# Patient Record
Sex: Male | Born: 1960 | Hispanic: Yes | Marital: Married | Smoking: Never smoker
Health system: Southern US, Community
[De-identification: ages and names within clinical notes are randomized; demographics above are authoritative.]

## PROBLEM LIST (undated history)

## (undated) DIAGNOSIS — N400 Enlarged prostate without lower urinary tract symptoms: Secondary | ICD-10-CM

## (undated) DIAGNOSIS — I10 Essential (primary) hypertension: Secondary | ICD-10-CM

## (undated) DIAGNOSIS — M545 Low back pain, unspecified: Secondary | ICD-10-CM

## (undated) DIAGNOSIS — T7840XA Allergy, unspecified, initial encounter: Secondary | ICD-10-CM

## (undated) DIAGNOSIS — M79605 Pain in left leg: Secondary | ICD-10-CM

## (undated) DIAGNOSIS — I639 Cerebral infarction, unspecified: Secondary | ICD-10-CM

## (undated) DIAGNOSIS — E785 Hyperlipidemia, unspecified: Secondary | ICD-10-CM

## (undated) HISTORY — PX: OTHER SURGICAL HISTORY: SHX169

## (undated) HISTORY — DX: Essential (primary) hypertension: I10

## (undated) HISTORY — DX: Allergy, unspecified, initial encounter: T78.40XA

## (undated) HISTORY — PX: COLONOSCOPY: SHX174

## (undated) HISTORY — PX: ESOPHAGOGASTRODUODENOSCOPY: SHX1529

## (undated) HISTORY — DX: Hyperlipidemia, unspecified: E78.5

## (undated) HISTORY — DX: Cerebral infarction, unspecified: I63.9

---

## 2013-01-22 ENCOUNTER — Ambulatory Visit (INDEPENDENT_AMBULATORY_CARE_PROVIDER_SITE_OTHER): Payer: 59 | Admitting: Family Medicine

## 2013-01-22 VITALS — BP 120/98 | HR 77 | Temp 98.2°F | Resp 18 | Ht 66.0 in | Wt 161.0 lb

## 2013-01-22 DIAGNOSIS — J029 Acute pharyngitis, unspecified: Secondary | ICD-10-CM

## 2013-01-22 LAB — POCT RAPID STREP A (OFFICE): Rapid Strep A Screen: NEGATIVE

## 2013-01-22 NOTE — Patient Instructions (Addendum)
Faringitis Viral (Viral Pharyngitis)  La faringitis virales una infeccin viral que produce enrojecimiento, dolor e hinchazn (inflamacin) en la garganta. No se disemina de [place] persona a otra (no es contagiosa). CAUSAS La causa es la inhalacin de una gran cantidad de grmenes llamados virus. Muchos virus diferentes pueden causar faringitis viral. SNTOMAS Los sntomas de faringitis viral son:  Dolor de Corporate treasurer.  Nariz tapada.  Fiebre no muy elevada  Congestin  Tos TRATAMIENTO El tratamiento incluye reposo, beber muchos lquidos y el uso de medicamentos de venta libre (autorizados por el mdico) INSTRUCCIONES PARA EL CUIDADO EN EL HOGAR   Debe ingerir gran cantidad de lquido para mantener la orina de tono claro o color amarillo plido.  Consuma alimentos blandos, fros, como helados de crema, de agua o gelatina.  Puede hacer grgaras con agua tibia con sal (una cucharadita en 1 litro de agua).  Despus de los 7 aos, pueden administrarse pastillas para la tos con seguridad.  Solo tome medicamentos que se pueden comprar sin receta o recetados para Conservation officer, historic buildings, Tree surgeon o fiebre, como le indica el mdico. No tome aspirina Para no contagiar evite:  El contacto boca a boca con Producer, television/film/video.  Compartir utensilios para comer o beber.  Toser cerca de otras personas SOLICITE ATENCIN MDICA SI:   Mejora luego de [employer] luego Victor.  Tiene fiebre o siente un dolor intenso que no puede ser controlado con los medicamentos.  Hay otros cambios que lo preocupan. Document Released: 10/12/2004 Document Revised: 03/27/2011 [facility] Patient Information 2014 [place], [place].    Take over-the-counter Claritin-D(loratadine D ) one daily to reduce the drainage  Drink plenty of fluids and get sufficient rest  If you develop a cough begin taking some Mucinex DM  Take Tylenol ibuprofen for the headache  Expect this to last for 5-7 days. If you're getting  worse get back to me

## 2013-01-22 NOTE — Progress Notes (Signed)
Subjective: 53 year old man here for the first time. He has been seeing a Dr. in [place] for his blood pressure and cholesterol. He is hoping to take a job. [employer] in the very near future. He used to live in [place] also. He was raised in [place]. He has been having a sore throat since yesterday. No nausea or vomiting. No ear problems. His head does hurt with a "sinus" headache. He has had some clear rhinorrhea. His chest has been okay with no cough. He usually gets these infections a couple of times a year.  Objective: Pleasant alert gentleman in no major distress. TMs normal. Throat erythematous mildly warm the right than the left. No nodes. He is a little cyst in his right tonsillar pillar. His neck was supple without significant nodes. Chest is clear to auscultation. Heart regular without murmurs.  Assessment: Pharyngitis URI  Plan: Strep test Throat culture if negative  Results for orders placed in visit on 01/22/13  POCT RAPID STREP A (OFFICE)      Result Value Range   Rapid Strep A Screen Negative  Negative

## 2013-01-24 LAB — CULTURE, GROUP A STREP

## 2014-02-27 ENCOUNTER — Ambulatory Visit (INDEPENDENT_AMBULATORY_CARE_PROVIDER_SITE_OTHER): Payer: 59 | Admitting: Physician Assistant

## 2014-02-27 VITALS — BP 122/80 | HR 79 | Temp 98.1°F | Resp 18 | Ht 67.25 in | Wt 163.4 lb

## 2014-02-27 DIAGNOSIS — J019 Acute sinusitis, unspecified: Secondary | ICD-10-CM

## 2014-02-27 MED ORDER — FLUTICASONE PROPIONATE 50 MCG/ACT NA SUSP: 2.0000 | Freq: Every day | NASAL | Status: DC

## 2014-02-27 NOTE — Progress Notes (Signed)
   Subjective:    Patient ID: Robert Sandoval, male    DOB: January 26, 1960, 54 y.o.   MRN: [id number]  HPI Patient presents with 2 weeks of sinus pressure and congestion. Sx not getting any worse, but are not going away. Sx accompanied by decreased hearing in right ear, and headache, but denies rhinorrhea, post nasal drip, sore throat, cough, fever, or chills. Has allergies and is taking loratadine. Also has tried nasal saline spray without much relief. NKDA.    Review of Systems  Constitutional: Negative for fever, chills and fatigue.  HENT: Positive for congestion and sinus pressure. Negative for ear discharge, ear pain, postnasal drip, rhinorrhea, sneezing and sore throat.   Eyes: Negative.   Respiratory: Negative for cough and shortness of breath.   Cardiovascular: Negative for chest pain.  Gastrointestinal: Negative for nausea and vomiting.  Allergic/Immunologic: Positive for environmental allergies.  Neurological: Positive for headaches. Negative for dizziness and light-headedness.       Objective:   Physical Exam  Constitutional: He is oriented to person, place, and time. He appears well-developed and well-nourished. No distress.  Blood pressure 122/80, pulse 79, temperature 98.1 F (36.7 C), temperature source Oral, resp. rate 18, height 5' 7.25" (1.708 m), weight 163 lb 6.4 oz (74.118 kg), SpO2 98 %.  HENT:  Head: Normocephalic and atraumatic.  Right Ear: Hearing, tympanic membrane, external ear and ear canal normal.  Left Ear: Hearing, tympanic membrane, external ear and ear canal normal.  Nose: Mucosal edema and rhinorrhea (with moderate erythema) present. Right sinus exhibits no maxillary sinus tenderness and no frontal sinus tenderness. Left sinus exhibits no maxillary sinus tenderness and no frontal sinus tenderness.  Mouth/Throat: Uvula is midline, oropharynx is clear and moist and mucous membranes are normal. No oropharyngeal exudate, posterior oropharyngeal edema or posterior  oropharyngeal erythema.  Eyes: Conjunctivae are normal. Pupils are equal, round, and reactive to light. Right eye exhibits no discharge. Left eye exhibits no discharge. No scleral icterus.  Neck: Normal range of motion. Neck supple. No thyromegaly present.  Cardiovascular: Normal rate, regular rhythm and normal heart sounds.  Exam reveals no gallop and no friction rub.   No murmur heard. Pulmonary/Chest: Effort normal and breath sounds normal. No respiratory distress. He has no decreased breath sounds. He has no wheezes. He has no rhonchi. He has no rales. He exhibits no tenderness.  Abdominal: There is no tenderness.  Lymphadenopathy:    He has no cervical adenopathy.  Neurological: He is alert and oriented to person, place, and time.  Skin: Skin is warm and dry. No rash noted. He is not diaphoretic. No erythema. No pallor.       Assessment & Plan:  1. Acute sinusitis, recurrence not specified, unspecified location Continue to take loratadine. Take mucinex with plenty of water.  - fluticasone (FLONASE) 50 MCG/ACT nasal spray; Place 2 sprays into both nostrils daily.  Dispense: 16 g; Refill: Success PA-C  [facility] 02/27/2014 1:09 PM

## 2014-02-27 NOTE — Patient Instructions (Signed)

## 2014-03-17 ENCOUNTER — Ambulatory Visit (INDEPENDENT_AMBULATORY_CARE_PROVIDER_SITE_OTHER): Payer: 59 | Admitting: Physician Assistant

## 2014-03-17 VITALS — BP 144/88 | HR 63 | Temp 98.0°F | Resp 17 | Ht 67.0 in | Wt 165.0 lb

## 2014-03-17 DIAGNOSIS — Z1329 Encounter for screening for other suspected endocrine disorder: Secondary | ICD-10-CM

## 2014-03-17 DIAGNOSIS — Z23 Encounter for immunization: Secondary | ICD-10-CM | POA: Diagnosis not present

## 2014-03-17 DIAGNOSIS — Z125 Encounter for screening for malignant neoplasm of prostate: Secondary | ICD-10-CM | POA: Diagnosis not present

## 2014-03-17 DIAGNOSIS — Z131 Encounter for screening for diabetes mellitus: Secondary | ICD-10-CM

## 2014-03-17 DIAGNOSIS — Z0000 Encounter for general adult medical examination without abnormal findings: Secondary | ICD-10-CM

## 2014-03-17 DIAGNOSIS — Z13228 Encounter for screening for other metabolic disorders: Secondary | ICD-10-CM | POA: Diagnosis not present

## 2014-03-17 DIAGNOSIS — Z13220 Encounter for screening for lipoid disorders: Secondary | ICD-10-CM

## 2014-03-17 LAB — POCT URINALYSIS DIPSTICK
BILIRUBIN UA: NEGATIVE
Glucose, UA: NEGATIVE
Ketones, UA: NEGATIVE
Leukocytes, UA: NEGATIVE
NITRITE UA: NEGATIVE
PH UA: 6.5
Protein, UA: NEGATIVE
Spec Grav, UA: 1.01
Urobilinogen, UA: 0.2

## 2014-03-17 LAB — POCT UA - MICROSCOPIC ONLY
BACTERIA, U MICROSCOPIC: NEGATIVE
Casts, Ur, LPF, POC: NEGATIVE
Crystals, Ur, HPF, POC: NEGATIVE
EPITHELIAL CELLS, URINE PER MICROSCOPY: NEGATIVE
MUCUS UA: NEGATIVE
RBC, urine, microscopic: NEGATIVE
WBC, Ur, HPF, POC: NEGATIVE
Yeast, UA: NEGATIVE

## 2014-03-17 NOTE — Progress Notes (Signed)
[facility] [address], [place] [address] [phone]  Date:  03/17/2014   Name:  Robert Sandoval   DOB:  10/11/1960   MRN:  [id number]  PCP:  No primary care provider on file.    Chief Complaint: Annual Exam   History of Present Illness:  Robert Sandoval is a 54 y.o. very pleasant male patient who presents with the following:  He has no complaints or concerns at this time.    Diet: Grilled white meat and seafoods mostly.  Patient has been attempting to eat better since he had started the blood pressure medication.  He eats very little fried food.     He was taking cholesterol.  Moved from [place], 1 year ago.  Bowels: Some constipation, which results with some blood.  He uses one pill every 2 days which he describes as a "colon cleanser" which helps.  Patient has had 2 colonoscopies by the age of 80.  The first colonoscopy was to address the constipation.  Last colonscopy 3 years ago found polyps of unknown number.   Urination: No dysuria, polyuria, or hematuria.    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    No familial hx of diabetes or cancer.  There are no active problems to display for this patient.   Past Medical History  Diagnosis Date  . Hyperlipidemia   . Hypertension   . Allergy     History reviewed. No pertinent past surgical history.  History  Substance Use Topics  . Smoking status: Never Smoker   . Smokeless tobacco: Not on file  . Alcohol Use: Yes    Family History  Problem Relation Age of Onset  . Hyperlipidemia Father   . Asthma Father     No Known Allergies  Medication list has been reviewed and updated.  Current Outpatient Prescriptions on File Prior to Visit  Medication Sig Dispense Refill  . fluticasone (FLONASE) 50 MCG/ACT nasal spray Place 2 sprays into both nostrils daily. 16 g 12  . loratadine (CLARITIN) 10 MG tablet Take 10 mg by mouth daily.    Marland Kitchen lisinopril (PRINIVIL,ZESTRIL) 10  MG tablet Take 10 mg by mouth daily.    . simvastatin (ZOCOR) 40 MG tablet Take 40 mg by mouth daily.     No current facility-administered medications on file prior to visit.    Review of Systems: ROS otherwise unremarkable unless listed above.  Physical Examination: Filed Vitals:   03/17/14 1206  BP: 144/88  Pulse: 63  Temp: 98 F (36.7 C)  Resp: 17   Filed Vitals:   03/17/14 1206  Height: 5\' 7"  (1.702 m)  Weight: 165 lb (74.844 kg)   Body mass index is 25.84 kg/(m^2). Ideal Body Weight: Weight in (lb) to have BMI = 25: 159.3  Physical Exam  Constitutional: He is oriented to person, place, and time. He appears well-developed and well-nourished.  HENT:  Head: Normocephalic and atraumatic.  Eyes: EOM are normal. Pupils are equal, round, and reactive to light.  Neck: Normal range of motion. Neck supple. No thyromegaly present.  Cardiovascular: Normal rate, regular rhythm, normal heart sounds and intact distal pulses.  Exam reveals no friction rub.   No murmur heard. Pulmonary/Chest: Effort normal and breath sounds normal. No respiratory distress. He has no wheezes. He exhibits no tenderness.  Abdominal: Soft. Bowel sounds are normal. Hernia confirmed negative in the right inguinal area and confirmed negative in the left  inguinal area.  Genitourinary: Prostate normal, testes normal and penis normal. Rectal exam shows external hemorrhoid (Very small hernia at the right posterior of anus). Rectal exam shows no internal hemorrhoid, no fissure and anal tone normal. Cremasteric reflex is present. No penile tenderness.  Musculoskeletal: Normal range of motion. He exhibits no edema or tenderness.  Lymphadenopathy:    He has no cervical adenopathy.  Neurological: He is alert and oriented to person, place, and time. He has normal reflexes.  Skin: Skin is warm and dry.  Psychiatric: He has a normal mood and affect. His speech is normal and behavior is normal. Judgment and thought content  normal. Cognition and memory are normal.      Assessment and Plan: 54 year old male with PMH of HL and HTN is here today for physical exam.  Patient's blood pressure was in a fairly good range at previous visits.  Patient has attempted to make lifestyle changes to avoid blood pressure medication. -Advised patient to check bp 2x/week, and if elevated, contact to restart anti-hypertensives. -Will refill statin once lipid results are available -Contacted [facility] for previous results from colonoscopy and recommeded timeline to repeat colonoscopy.  Annual physical exam - Plan: TSH, COMPLETE METABOLIC PANEL WITH GFR, PSA, POCT UA - Microscopic Only, POCT urinalysis dipstick, Lipid panel, Flu Vaccine QUAD 36+ mos IM  Need for prophylactic vaccination and inoculation against influenza - Plan: Flu Vaccine QUAD 36+ mos IM  Screening for thyroid disorder - Plan: TSH  Screening for prostate cancer - Plan: PSA  Screening for diabetes mellitus - Plan: POCT UA - Microscopic Only, POCT urinalysis dipstick  Screening for hyperlipidemia - Plan: Lipid panel  Screening for metabolic disorder - Plan: COMPLETE METABOLIC PANEL WITH GFR  Ivar Drape, PA-C [facility] 3/1/20168:00 PM

## 2014-03-17 NOTE — Patient Instructions (Signed)
Your Blood pressure has been fine when you have been present so check your blood pressure at home or at the pharmacy.  If you have the blood pressure over 140/90, then you need to return here for blood pressure medication.  I will have your labs within the next 10 days.      Keeping you healthy  Get these tests  Blood pressure- Have your blood pressure checked once a year by your healthcare provider.  Normal blood pressure is 120/80  Weight- Have your body mass index (BMI) calculated to screen for obesity.  BMI is a measure of body fat based on height and weight. You can also calculate your own BMI at ViewBanking.si.  Cholesterol- Have your cholesterol checked every year.  Diabetes- Have your blood sugar checked regularly if you have high blood pressure, high cholesterol, have a family history of diabetes or if you are overweight.  Screening for Colon Cancer- Colonoscopy starting at age 69.  Screening may begin sooner depending on your family history and other health conditions. Follow up colonoscopy as directed by your Gastroenterologist.  Screening for Prostate Cancer- Both blood work (PSA) and a rectal exam help screen for Prostate Cancer.  Screening begins at age 73 with African-American men and at age 26 with Caucasian men.  Screening may begin sooner depending on your family history.  Take these medicines  Aspirin- One aspirin daily can help prevent Heart disease and Stroke.  Flu shot- Every fall.  Tetanus- Every 10 years.  Zostavax- Once after the age of 41 to prevent Shingles.  Pneumonia shot- Once after the age of 31; if you are younger than 1, ask your healthcare provider if you need a Pneumonia shot.  Take these steps  Don't smoke- If you do smoke, talk to your doctor about quitting.  For tips on how to quit, go to www.smokefree.gov or call 1-800-QUIT-NOW.  Be physically active- Exercise 5 days a week for at least 30 minutes.  If you are not already  physically active start slow and gradually work up to 30 minutes of moderate physical activity.  Examples of moderate activity include walking briskly, mowing the yard, dancing, swimming, bicycling, etc.  Eat a healthy diet- Eat a variety of healthy food such as fruits, vegetables, low fat milk, low fat cheese, yogurt, lean meant, poultry, fish, beans, tofu, etc. For more information go to www.thenutritionsource.org  Drink alcohol in moderation- Limit alcohol intake to less than two drinks a day. Never drink and drive.  Dentist- Brush and floss twice daily; visit your dentist twice a year.  Depression- Your emotional health is as important as your physical health. If you're feeling down, or losing interest in things you would normally enjoy please talk to your healthcare provider.  Eye exam- Visit your eye doctor every year.  Safe sex- If you may be exposed to a sexually transmitted infection, use a condom.  Seat belts- Seat belts can save your life; always wear one.  Smoke/Carbon Monoxide detectors- These detectors need to be installed on the appropriate level of your home.  Replace batteries at least once a year.  Skin cancer- When out in the sun, cover up and use sunscreen 15 SPF or higher.  Violence- If anyone is threatening you, please tell your healthcare provider.  Living Will/ Health care power of attorney- Speak with your healthcare provider and family.

## 2014-03-18 LAB — PSA: PSA: 1.85 ng/mL (ref <=4.00)

## 2014-03-18 LAB — COMPLETE METABOLIC PANEL WITH GFR
ALBUMIN: 4.1 g/dL (ref 3.5–5.2)
ALK PHOS: 82 U/L (ref 39–117)
ALT: 27 U/L (ref 0–53)
AST: 22 U/L (ref 0–37)
BILIRUBIN TOTAL: 1.2 mg/dL (ref 0.2–1.2)
BUN: 11 mg/dL (ref 6–23)
CHLORIDE: 107 meq/L (ref 96–112)
CO2: 27 meq/L (ref 19–32)
CREATININE: 0.61 mg/dL (ref 0.50–1.35)
Calcium: 9.1 mg/dL (ref 8.4–10.5)
GFR, Est African American: >89 mL/min
GLUCOSE: 92 mg/dL (ref 70–99)
Potassium: 3.8 mEq/L (ref 3.5–5.3)
SODIUM: 140 meq/L (ref 135–145)
Total Protein: 7.1 g/dL (ref 6.0–8.3)

## 2014-03-18 LAB — LIPID PANEL
CHOL/HDL RATIO: 5.1 ratio
Cholesterol: 175 mg/dL (ref 0–200)
HDL: 34 mg/dL — ABNORMAL LOW (ref >=40)
LDL CALC: 105 mg/dL — AB (ref 0–99)
TRIGLYCERIDES: 179 mg/dL — AB (ref <150)
VLDL: 36 mg/dL (ref 0–40)

## 2014-03-18 LAB — TSH: TSH: 2.26 u[IU]/mL (ref 0.350–4.500)

## 2014-04-23 ENCOUNTER — Telehealth: Payer: Self-pay

## 2014-04-23 NOTE — Telephone Encounter (Signed)
Pt calling out labs. Advised.

## 2014-07-06 ENCOUNTER — Ambulatory Visit (INDEPENDENT_AMBULATORY_CARE_PROVIDER_SITE_OTHER): Payer: 59 | Admitting: Family Medicine

## 2014-07-06 VITALS — BP 160/100 | HR 90 | Temp 98.8°F | Resp 17 | Ht 67.0 in | Wt 164.2 lb

## 2014-07-06 DIAGNOSIS — IMO0001 Reserved for inherently not codable concepts without codable children: Secondary | ICD-10-CM

## 2014-07-06 DIAGNOSIS — J3489 Other specified disorders of nose and nasal sinuses: Secondary | ICD-10-CM

## 2014-07-06 DIAGNOSIS — R030 Elevated blood-pressure reading, without diagnosis of hypertension: Secondary | ICD-10-CM

## 2014-07-06 LAB — POCT CBC
GRANULOCYTE PERCENT: 64.6 % (ref 37–80)
HEMATOCRIT: 45.7 % (ref 43.5–53.7)
Hemoglobin: 15.1 g/dL (ref 14.1–18.1)
Lymph, poc: 1.7 (ref 0.6–3.4)
MCH, POC: 29.1 pg (ref 27–31.2)
MCHC: 33 g/dL (ref 31.8–35.4)
MCV: 88 fL (ref 80–97)
MID (cbc): 0.5 (ref 0–0.9)
MPV: 7 fL (ref 0–99.8)
PLATELET COUNT, POC: 266 10*3/uL (ref 142–424)
POC GRANULOCYTE: 3.9 (ref 2–6.9)
POC LYMPH PERCENT: 27.8 %L (ref 10–50)
POC MID %: 7.6 %M (ref 0–12)
RBC: 5.19 M/uL (ref 4.69–6.13)
RDW, POC: 13.4 %
WBC: 6.1 10*3/uL (ref 4.6–10.2)

## 2014-07-06 MED ORDER — AMOXICILLIN 500 MG PO CAPS: 1000.0000 mg | ORAL_CAPSULE | Freq: Two times a day (BID) | ORAL | Status: DC

## 2014-07-06 MED ORDER — PREDNISONE 20 MG PO TABS: ORAL_TABLET | ORAL | Status: DC

## 2014-07-06 NOTE — Patient Instructions (Signed)
Your blood count suggests a viral (not bacterial) illness.   Use the nasal spray as directed and plain mucinex as needed I think that the dayquil is raising your blood pressure- stop using this and use either tylenol or ibuprofen instead If your symptoms are not better by the end of the week fill and use the amoxicillin rx.

## 2014-07-06 NOTE — Progress Notes (Signed)
[facility] [address] [place] [address] [phone]  Date:  07/06/2014   Name:  Robert Sandoval   DOB:  1960-03-02   MRN:  [id number]  PCP:  No primary care provider on file.    Chief Complaint: Headache; Cough; and Hypertension   History of Present Illness:  Robert Sandoval is a 54 y.o. very pleasant male patient who presents with the following:  Established pt here today with ilness and concern about his BP. He notes sinus pressure for 3 days or so.  He was working over the weekend and could not come in until today. He notes pain and pressure behind his eyes, pain with bending over in his face, dry cough.   He thinks that he had a fever- because "I could feel it in my teeth." Mild sore throat "from coughing," mild earache.  No GI symtpoms   BP Readings from Last 3 Encounters:  07/06/14 153/103  03/17/14 144/88  02/27/14 122/80   He has been taking some OTC sinus medications- decongestant  There are no active problems to display for this patient.   Past Medical History  Diagnosis Date  . Hyperlipidemia   . Hypertension   . Allergy     History reviewed. No pertinent past surgical history.  History  Substance Use Topics  . Smoking status: Never Smoker   . Smokeless tobacco: Not on file  . Alcohol Use: Yes    Family History  Problem Relation Age of Onset  . Hyperlipidemia Father   . Asthma Father     No Known Allergies  Medication list has been reviewed and updated.  Current Outpatient Prescriptions on File Prior to Visit  Medication Sig Dispense Refill  . fluticasone (FLONASE) 50 MCG/ACT nasal spray Place 2 sprays into both nostrils daily. 16 g 12  . loratadine (CLARITIN) 10 MG tablet Take 10 mg by mouth daily.    . simvastatin (ZOCOR) 40 MG tablet Take 40 mg by mouth daily.    Marland Kitchen lisinopril (PRINIVIL,ZESTRIL) 10 MG tablet Take 10 mg by mouth daily.     No current facility-administered medications on file prior to visit.     Review of Systems: As per HPI- otherwise negative.    Physical Examination: Filed Vitals:   07/06/14 1248  BP: 153/103  Pulse: 90  Temp: 98.8 F (37.1 C)  Resp: 17   Filed Vitals:   07/06/14 1248  Height: 5\' 7"  (1.702 m)  Weight: 164 lb 3.2 oz (74.481 kg)   Body mass index is 25.71 kg/(m^2). Ideal Body Weight: Weight in (lb) to have BMI = 25: 159.3  GEN: WDWN, NAD, Non-toxic, A & O x 3 HEENT: Atraumatic, Normocephalic. Neck supple. No masses, No LAD.   Bilateral TM wnl, oropharynx normal.  PEERL,EOMI.   Frontal sinuses tender to percussion Ears and Nose: No external deformity. CV: RRR, No M/G/R. No JVD. No thrill. No extra heart sounds. PULM: CTA B, no wheezes, crackles, rhonchi. No retractions. No resp. distress. No accessory muscle use. EXTR: No c/c/e NEURO Normal gait.  PSYCH: Normally interactive. Conversant. Not depressed or anxious appearing.  Calm demeanor.   Results for orders placed or performed in visit on 07/06/14  POCT CBC  Result Value Ref Range   WBC 6.1 4.6 - 10.2 K/uL   Lymph, poc 1.7 0.6 - 3.4   POC LYMPH PERCENT 27.8 10 - 50 %L   MID (cbc) 0.5 0 - 0.9   POC MID % 7.6 0 -  12 %M   POC Granulocyte 3.9 2 - 6.9   Granulocyte percent 64.6 37 - 80 %G   RBC 5.19 4.69 - 6.13 M/uL   Hemoglobin 15.1 14.1 - 18.1 g/dL   HCT, POC 45.7 43.5 - 53.7 %   MCV 88.0 80 - 97 fL   MCH, POC 29.1 27 - 31.2 pg   MCHC 33.0 31.8 - 35.4 g/dL   RDW, POC 13.4 %   Platelet Count, POC 266 142 - 424 K/uL   MPV 7.0 0 - 99.8 fL    Assessment and Plan: Sinus pressure - Plan: POCT CBC, amoxicillin (AMOXIL) 500 MG capsule, Ambulatory referral to ENT  Elevated BP  Sinus pain - Plan: predniSONE (DELTASONE) 20 MG tablet, Ambulatory referral to ENT  Likely viral sinusitis.  He is miserable with pressure and decongestants are raising his BP.  Asked him to stop dayquil, will try short course of prednisone, plain mucinex and tylenol/ibuprofen as needed If not better soon he may  fill and use amoxicillin rx Referral to ENT as he notes frequent problems with his sinuses and would be interested in having sinuplasty if appropriate   Signed Lamar Blinks, MD

## 2014-09-14 ENCOUNTER — Ambulatory Visit (INDEPENDENT_AMBULATORY_CARE_PROVIDER_SITE_OTHER): Payer: 59 | Admitting: Emergency Medicine

## 2014-09-14 VITALS — BP 132/90 | HR 82 | Temp 98.2°F | Resp 18 | Ht 67.0 in | Wt 163.8 lb

## 2014-09-14 DIAGNOSIS — N400 Enlarged prostate without lower urinary tract symptoms: Secondary | ICD-10-CM | POA: Diagnosis not present

## 2014-09-14 DIAGNOSIS — I10 Essential (primary) hypertension: Secondary | ICD-10-CM

## 2014-09-14 DIAGNOSIS — K602 Anal fissure, unspecified: Secondary | ICD-10-CM

## 2014-09-14 LAB — POCT URINALYSIS DIPSTICK
BILIRUBIN UA: NEGATIVE
Glucose, UA: NEGATIVE
KETONES UA: NEGATIVE
Leukocytes, UA: NEGATIVE
NITRITE UA: NEGATIVE
PH UA: 6.5
Protein, UA: NEGATIVE
RBC UA: NEGATIVE
SPEC GRAV UA: 1.01
Urobilinogen, UA: 0.2

## 2014-09-14 LAB — COMPREHENSIVE METABOLIC PANEL
ALT: 26 U/L (ref 9–46)
AST: 22 U/L (ref 10–35)
Albumin: 4.4 g/dL (ref 3.6–5.1)
Alkaline Phosphatase: 74 U/L (ref 40–115)
BILIRUBIN TOTAL: 1.6 mg/dL — AB (ref 0.2–1.2)
BUN: 14 mg/dL (ref 7–25)
CO2: 26 mmol/L (ref 20–31)
CREATININE: 0.85 mg/dL (ref 0.70–1.33)
Calcium: 9.5 mg/dL (ref 8.6–10.3)
Chloride: 103 mmol/L (ref 98–110)
GLUCOSE: 82 mg/dL (ref 65–99)
Potassium: 3.7 mmol/L (ref 3.5–5.3)
SODIUM: 139 mmol/L (ref 135–146)
Total Protein: 7.1 g/dL (ref 6.1–8.1)

## 2014-09-14 LAB — LIPID PANEL
Cholesterol: 197 mg/dL (ref 125–200)
HDL: 32 mg/dL — ABNORMAL LOW (ref >=40)
LDL CALC: 111 mg/dL (ref <130)
Total CHOL/HDL Ratio: 6.2 Ratio — ABNORMAL HIGH (ref <=5.0)
Triglycerides: 269 mg/dL — ABNORMAL HIGH (ref <150)
VLDL: 54 mg/dL — AB (ref <30)

## 2014-09-14 LAB — POCT UA - MICROSCOPIC ONLY
Bacteria, U Microscopic: NEGATIVE
CASTS, UR, LPF, POC: NEGATIVE
CRYSTALS, UR, HPF, POC: NEGATIVE
Epithelial cells, urine per micros: NEGATIVE
Mucus, UA: NEGATIVE
RBC, URINE, MICROSCOPIC: NEGATIVE
WBC, Ur, HPF, POC: NEGATIVE
Yeast, UA: NEGATIVE

## 2014-09-14 MED ORDER — TAMSULOSIN HCL 0.4 MG PO CAPS: 0.4000 mg | ORAL_CAPSULE | Freq: Every day | ORAL | Status: DC

## 2014-09-14 MED ORDER — POLYETHYLENE GLYCOL 3350 17 GM/SCOOP PO POWD: 17.0000 g | Freq: Every day | ORAL | Status: DC

## 2014-09-14 MED ORDER — NITROGLYCERIN 2 % TD OINT: TOPICAL_OINTMENT | TRANSDERMAL | Status: DC

## 2014-09-14 MED ORDER — LISINOPRIL 20 MG PO TABS: 20.0000 mg | ORAL_TABLET | Freq: Every day | ORAL | Status: DC

## 2014-09-14 NOTE — Patient Instructions (Signed)
Benign Prostatic Hyperplasia An enlarged prostate (benign prostatic hyperplasia) is common in older men. You may experience the following:  Weak urine stream.  Dribbling.  Feeling like the bladder has not emptied completely.  Difficulty starting urination.  Getting up frequently at night to urinate.  Urinating more frequently during the day. HOME CARE INSTRUCTIONS  Monitor your prostatic hyperplasia for any changes. The following actions may help to alleviate any discomfort you are experiencing:  Give yourself time when you urinate.  Stay away from alcohol.  Avoid beverages containing caffeine, such as coffee, tea, and colas, because they can make the problem worse.  Avoid decongestants, antihistamines, and some prescription medicines that can make the problem worse.  Follow up with your health care provider for further treatment as recommended. SEEK MEDICAL CARE IF:  You are experiencing progressive difficulty voiding.  Your urine stream is progressively getting narrower.  You are awaking from sleep with the urge to void more frequently.  You are constantly feeling the need to void.  You experience loss of urine, especially in small amounts. SEEK IMMEDIATE MEDICAL CARE IF:   You develop increased pain with urination or are unable to urinate.  You develop severe abdominal pain, vomiting, a high fever, or fainting.  You develop back pain or blood in your urine. MAKE SURE YOU:   Understand these instructions.  Will watch your condition.  Will get help right away if you are not doing well or get worse. Document Released: 01/02/2005 Document Revised: 09/04/2012 Document Reviewed: 06/04/2012 ExitCare Patient Information 2015 [employer]. This information is not intended to replace advice given to you by your health care provider. Make sure you discuss any questions you have with your health care provider.  

## 2014-09-14 NOTE — Progress Notes (Signed)
Subjective:  Patient ID: Robert Sandoval, male    DOB: 1960-06-30  Age: 54 y.o. MRN: [id number]  CC: Hypertension and Hemorrhoids   HPI Robert Sandoval presents   With a number complaints. He said that he is concerned about his blood pressure. He said that at home his blood pressure measures usually in the 150/100 range he's not compliant with treatment with antihypertensives usually. Takes medication only when he feels the need to.   He hasno chest pain pressure tightness heaviness pressure shortness breath.   he is concerned that his prostate is enlarged wants referral to a urologist. He is describes and a sensation of urge incontinence. He has decreased force of stream and hesitancy.   he has anal pain and blood with stooling. He has no change in stool character. Has no abdominal pain nausea or vomiting.  History Robert Sandoval has a past medical history of Hyperlipidemia; Hypertension; and Allergy.   He has no past surgical history on file.   His  family history includes Asthma in his father; Hyperlipidemia in his father.  He   reports that he has never smoked. He does not have any smokeless tobacco history on file. He reports that he drinks alcohol. He reports that he does not use illicit drugs.  Outpatient Prescriptions Prior to Visit  Medication Sig Dispense Refill  . fluticasone (FLONASE) 50 MCG/ACT nasal spray Place 2 sprays into both nostrils daily. 16 g 12  . loratadine (CLARITIN) 10 MG tablet Take 10 mg by mouth daily.    Marland Kitchen amoxicillin (AMOXIL) 500 MG capsule Take 2 capsules (1,000 mg total) by mouth 2 (two) times daily. (Patient not taking: Reported on 09/14/2014) 40 capsule 0  . predniSONE (DELTASONE) 20 MG tablet Take 2 pills a day for 3 days (Patient not taking: Reported on 09/14/2014) 6 tablet 0  . simvastatin (ZOCOR) 40 MG tablet Take 40 mg by mouth daily.    Marland Kitchen lisinopril (PRINIVIL,ZESTRIL) 10 MG tablet Take 10 mg by mouth daily.     No facility-administered medications prior to  visit.    Social History   Social History  . Marital Status: Divorced    Spouse Name: N/A  . Number of Children: N/A  . Years of Education: N/A   Social History Main Topics  . Smoking status: Never Smoker   . Smokeless tobacco: None  . Alcohol Use: Yes  . Drug Use: No  . Sexual Activity: Not Asked   Other Topics Concern  . None   Social History Narrative     Review of Systems  Constitutional: Negative for fever, chills and appetite change.  HENT: Negative for congestion, ear pain, postnasal drip, sinus pressure and sore throat.   Eyes: Negative for pain and redness.  Respiratory: Negative for cough, shortness of breath and wheezing.   Cardiovascular: Negative for leg swelling.  Gastrointestinal: Positive for rectal pain. Negative for nausea, vomiting, abdominal pain, diarrhea, constipation and blood in stool.  Endocrine: Negative for polyuria.  Genitourinary: Negative for dysuria, urgency, frequency and flank pain.  Musculoskeletal: Negative for gait problem.  Skin: Negative for rash.  Neurological: Negative for weakness and headaches.  Psychiatric/Behavioral: Negative for confusion and decreased concentration. The patient is not nervous/anxious.     Objective:  BP 132/90 mmHg  Pulse 82  Temp(Src) 98.2 F (36.8 C) (Oral)  Resp 18  Ht 5\' 7"  (1.702 m)  Wt 163 lb 12.8 oz (74.299 kg)  BMI 25.65 kg/m2  SpO2 98%  Physical Exam  Constitutional: He  is oriented to person, place, and time. He appears well-developed and well-nourished. No distress.  HENT:  Head: Normocephalic and atraumatic.  Right Ear: External ear normal.  Left Ear: External ear normal.  Nose: Nose normal.  Eyes: Conjunctivae and EOM are normal. Pupils are equal, round, and reactive to light. No scleral icterus.  Neck: Normal range of motion. Neck supple. No tracheal deviation present.  Cardiovascular: Normal rate, regular rhythm and normal heart sounds.   Pulmonary/Chest: Effort normal. No  respiratory distress. He has no wheezes. He has no rales.  Abdominal: He exhibits no mass. There is no tenderness. There is no rebound and no guarding.  Musculoskeletal: He exhibits no edema.  Lymphadenopathy:    He has no cervical adenopathy.  Neurological: He is alert and oriented to person, place, and time. Coordination normal.  Skin: Skin is warm and dry. No rash noted.  Psychiatric: He has a normal mood and affect. His behavior is normal.    digital rectal exam was significant for an anal fissure and his prostate was rather large. There is no visible or palpable hemorrhoid.   Assessment & Plan:   Robert Sandoval was seen today for hypertension and hemorrhoids.  Diagnoses and all orders for this visit:  Essential hypertension, benign -     PSA -     POCT urinalysis dipstick -     POCT UA - Microscopic Only -     Comprehensive metabolic panel -     Lipid panel  BPH (benign prostatic hyperplasia) -     PSA -     POCT urinalysis dipstick -     POCT UA - Microscopic Only -     Comprehensive metabolic panel -     Lipid panel  Anal fissure  Other orders -     lisinopril (PRINIVIL,ZESTRIL) 20 MG tablet; Take 1 tablet (20 mg total) by mouth daily. -     tamsulosin (FLOMAX) 0.4 MG CAPS capsule; Take 1 capsule (0.4 mg total) by mouth daily. -     nitroGLYCERIN (NITROGLYN) 2 % ointment; Apply a small amount externally to anus twice daily -     polyethylene glycol powder (GLYCOLAX/MIRALAX) powder; Take 17 g by mouth daily.   I have changed Robert Sandoval's lisinopril. I am also having him start on tamsulosin, nitroGLYCERIN, and polyethylene glycol powder. Additionally, I am having him maintain his simvastatin, loratadine, fluticasone, amoxicillin, and predniSONE.  Meds ordered this encounter  Medications  . lisinopril (PRINIVIL,ZESTRIL) 20 MG tablet    Sig: Take 1 tablet (20 mg total) by mouth daily.    Dispense:  30 tablet    Refill:  5  . tamsulosin (FLOMAX) 0.4 MG CAPS capsule    Sig:  Take 1 capsule (0.4 mg total) by mouth daily.    Dispense:  30 capsule    Refill:  3  . nitroGLYCERIN (NITROGLYN) 2 % ointment    Sig: Apply a small amount externally to anus twice daily    Dispense:  30 g    Refill:  0  . polyethylene glycol powder (GLYCOLAX/MIRALAX) powder    Sig: Take 17 g by mouth daily.    Dispense:  3350 g    Refill:  1    Appropriate red flag conditions were discussed with the patient as well as actions that should be taken.  Patient expressed his understanding.  Follow-up: Return if symptoms worsen or fail to improve.  Roselee Culver, MD

## 2014-09-15 LAB — PSA: PSA: 2.23 ng/mL (ref <=4.00)

## 2014-10-25 ENCOUNTER — Ambulatory Visit (INDEPENDENT_AMBULATORY_CARE_PROVIDER_SITE_OTHER): Payer: 59 | Admitting: Emergency Medicine

## 2014-10-25 VITALS — BP 118/80 | HR 67 | Temp 98.1°F | Resp 18 | Ht 67.0 in | Wt 162.2 lb

## 2014-10-25 DIAGNOSIS — I10 Essential (primary) hypertension: Secondary | ICD-10-CM

## 2014-10-25 DIAGNOSIS — L290 Pruritus ani: Secondary | ICD-10-CM | POA: Diagnosis not present

## 2014-10-25 MED ORDER — HYDROCORTISONE 2.5 % RE CREA: 1.0000 "application " | TOPICAL_CREAM | Freq: Every day | RECTAL | Status: DC

## 2014-10-25 MED ORDER — HYDROXYZINE HCL 25 MG PO TABS: 25.0000 mg | ORAL_TABLET | Freq: Every evening | ORAL | Status: AC | PRN

## 2014-10-25 MED ORDER — MEBENDAZOLE 100 MG PO CHEW: 100.0000 mg | CHEWABLE_TABLET | Freq: Two times a day (BID) | ORAL | Status: DC

## 2014-10-25 NOTE — Patient Instructions (Signed)
No more nitroglycerin cream to your bottom.  Use desitin (or some other diaper rash cream) and your bottom  I will give you something at night to help with the itching in pill form  Continue using wet wipes to clean after a BM  But do not over wipe.  Start using the new cream (anusol HC) on your bottom at night - you will have to wash off the diaper cream before you apply this or it will not get to the raw skin.  We are going to treat for pin worms in the low chance that is causing your itching  For your BP - continue careful monitoring and keep track on a log and how you are feeling for the day - split your current pill in half for the next several days -

## 2014-10-25 NOTE — Progress Notes (Signed)
Robert Sandoval  MRN: [id number] DOB: May 20, 1960  Subjective:  Pt presents to clinic with low blood pressure and feeling bad today.  He takes his BP medication daily and when he woke up this am he felt bad so he took his BP with his home monitor and it was low.  He had already taken his medication by the time he realized his BP was really low.  He has taken his BP multiple times today and throughout the day his BP has increased and he feels better now.  He is currently having no dizziness.  He is not sure what he needs to do with his BP.  He is also concerned because he has been using the nitroglycerin cream on his anal fissure and the area is not getting better it is getting worse.  He is using wet wipes to clean with.  He has increased itching at night and when he stops sitting.  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  He no longer is having large hard stools but rather thin soft stools.  He still has pain when he passes the stool.  Patient Active Problem List   Diagnosis Date Noted  . Essential hypertension, benign 09/14/2014    Current Outpatient Prescriptions on File Prior to Visit  Medication Sig Dispense Refill  . fluticasone (FLONASE) 50 MCG/ACT nasal spray Place 2 sprays into both nostrils daily. 16 g 12  . lisinopril (PRINIVIL,ZESTRIL) 20 MG tablet Take 1 tablet (20 mg total) by mouth daily. 30 tablet 5  . loratadine (CLARITIN) 10 MG tablet Take 10 mg by mouth daily.    . tamsulosin (FLOMAX) 0.4 MG CAPS capsule Take 1 capsule (0.4 mg total) by mouth daily. 30 capsule 3  . polyethylene glycol powder (GLYCOLAX/MIRALAX) powder Take 17 g by mouth daily. (Patient not taking: Reported on 10/25/2014) 3350 g 1   No current facility-administered medications on file prior to visit.    No Known Allergies  Review of Systems  Gastrointestinal: Positive for anal bleeding (after passing BM). Negative for diarrhea and constipation.  Neurological:  Dizziness: earlier today.   Objective:  BP 118/80 mmHg  Pulse 67  Temp(Src) 98.1 F (36.7 C) (Oral)  Resp 18  Ht 5\' 7"  (1.702 m)  Wt 162 lb 3.2 oz (73.573 kg)  BMI 25.40 kg/m2  SpO2 99%  Physical Exam  Constitutional: He is oriented to person, place, and time and well-developed, well-nourished, and in no distress.  HENT:  Head: Normocephalic and atraumatic.  Right Ear: External ear normal.  Left Ear: External ear normal.  Eyes: Conjunctivae are normal.  Neck: Normal range of motion.  Cardiovascular: Normal rate, regular rhythm and normal heart sounds.   No murmur heard. Pulmonary/Chest: Effort normal and breath sounds normal.  Genitourinary:  Multiple excoriations in the directly of his skin lines - all directions from his anus.  No specific anal fissure seen.  Macerated tissue surrounding the excoriations.    Neurological: He is alert and oriented to person, place, and time. Gait normal.  Skin: Skin is warm and dry.  Psychiatric: Mood, memory, affect and judgment normal.    Assessment and Plan :  Essential hypertension, benign  Anal itching - Plan: hydrocortisone (ANUSOL-HC) 2.5 % rectal cream, hydrOXYzine (ATARAX/VISTARIL) 25 MG tablet, mebendazole (VERMOX) 100 MG chewable tablet  We will treat for pinworms due to intense itching.  I suspect that his itching is a result of multiple things -  healing and wet tissue and abrasion and friction.  He will stop the nitroglycerin and start with a barrier protectant like Desitin which he will keep on at all times during the day but he will wash off at night to put on anusol [facility] in hopes to increase the healing tissue and decrease the inflammation.  He will continue with wet wipes but he will be sure not to over clean the area.  We will treat his itching at night because he thinks that he may be scratching the area.  He will monitor his BP and decrease his medication to 1/2 pill - he will keep a log and contact me through mychart about how  he feels and his BP readings.  He will recheck with me in 10 days for both problems.  D/w Dr Gillis Ends PA-C  [facility] 10/25/2014 5:08 PM

## 2014-11-05 ENCOUNTER — Ambulatory Visit (INDEPENDENT_AMBULATORY_CARE_PROVIDER_SITE_OTHER): Payer: 59 | Admitting: Physician Assistant

## 2014-11-05 ENCOUNTER — Encounter: Payer: Self-pay | Admitting: Physician Assistant

## 2014-11-05 VITALS — BP 113/73 | HR 67 | Temp 97.8°F | Resp 16 | Wt 161.4 lb

## 2014-11-05 DIAGNOSIS — R5383 Other fatigue: Secondary | ICD-10-CM | POA: Diagnosis not present

## 2014-11-05 DIAGNOSIS — I10 Essential (primary) hypertension: Secondary | ICD-10-CM

## 2014-11-05 DIAGNOSIS — Z23 Encounter for immunization: Secondary | ICD-10-CM

## 2014-11-05 DIAGNOSIS — L290 Pruritus ani: Secondary | ICD-10-CM | POA: Diagnosis not present

## 2014-11-05 NOTE — Progress Notes (Signed)
   Robert Sandoval  MRN: [id number] DOB: 03-24-1960  Subjective:  Pt presents to clinic for a recheck.  The area on his perineal area has resolved but he is still using destin to help with healing - it feels much better.  He has been taking his normal Lisinoptil 20mg  and his BP has been low but he has been feeling fine.  He is interested to learn about his testosterone because he has noticed recently he has decrease if energy and he has heard this could be the cause.  He is having no problems with his erections.  His father died 3 months ago and he is still sad but he is improving and he states that the decrease in energy occurred at the same time.  He does not feel like he is depressed and he is not having depressed symptoms.   Patient Active Problem List   Diagnosis Date Noted  . Essential hypertension, benign 09/14/2014    Current Outpatient Prescriptions on File Prior to Visit  Medication Sig Dispense Refill  . fluticasone (FLONASE) 50 MCG/ACT nasal spray Place 2 sprays into both nostrils daily. 16 g 12  . hydrocortisone (ANUSOL-HC) 2.5 % rectal cream Place 1 application rectally at bedtime. 30 g 0  . hydrOXYzine (ATARAX/VISTARIL) 25 MG tablet Take 1 tablet (25 mg total) by mouth at bedtime as needed for itching. 15 tablet 0  . lisinopril (PRINIVIL,ZESTRIL) 20 MG tablet Take 1 tablet (20 mg total) by mouth daily. 30 tablet 5  . loratadine (CLARITIN) 10 MG tablet Take 10 mg by mouth daily.    . polyethylene glycol powder (GLYCOLAX/MIRALAX) powder Take 17 g by mouth daily. 3350 g 1  . tamsulosin (FLOMAX) 0.4 MG CAPS capsule Take 1 capsule (0.4 mg total) by mouth daily. 30 capsule 3   No current facility-administered medications on file prior to visit.    No Known Allergies  Review of Systems  Psychiatric/Behavioral: Positive for dysphoric mood and decreased concentration. The patient is nervous/anxious.    Objective:  BP 113/73 mmHg  Pulse 67  Temp(Src) 97.8 F (36.6 C) (Oral)  Resp  16  Wt 161 lb 6.4 oz (73.211 kg)  Physical Exam  Constitutional: He is oriented to person, place, and time and well-developed, well-nourished, and in no distress.  HENT:  Head: Normocephalic and atraumatic.  Right Ear: External ear normal.  Left Ear: External ear normal.  Eyes: Conjunctivae are normal.  Neck: Normal range of motion.  Pulmonary/Chest: Effort normal.  Neurological: He is alert and oriented to person, place, and time. Gait normal.  Skin: Skin is warm and dry.  Psychiatric: Mood, memory, affect and judgment normal.    Assessment and Plan :  Need for prophylactic vaccination and inoculation against influenza - Plan: Flu Vaccine QUAD 36+ mos IM  Decreased energy - Plan: Testosterone, Free, Total, SHBG  Essential hypertension, benign - no more episodes of low bp  Anal itching/Abrasion- perineal area - resolved  Recheck in 3 months  Windell Hummingbird PA-C  [facility] 11/05/2014 4:31 PM

## 2014-11-06 LAB — TESTOSTERONE, FREE, TOTAL, SHBG
SEX HORMONE BINDING: 37 nmol/L (ref 10–50)
TESTOSTERONE FREE: 61.5 pg/mL (ref 47.0–244.0)
Testosterone-% Free: 1.9 % (ref 1.6–2.9)
Testosterone: 332 ng/dL (ref 300–890)

## 2015-03-12 ENCOUNTER — Other Ambulatory Visit: Payer: Self-pay

## 2015-03-12 MED ORDER — LISINOPRIL 20 MG PO TABS: 20.0000 mg | ORAL_TABLET | Freq: Every day | ORAL | Status: DC

## 2015-03-18 ENCOUNTER — Encounter: Payer: Self-pay | Admitting: Family Medicine

## 2015-03-18 ENCOUNTER — Ambulatory Visit (INDEPENDENT_AMBULATORY_CARE_PROVIDER_SITE_OTHER): Payer: Managed Care, Other (non HMO) | Admitting: Family Medicine

## 2015-03-18 VITALS — BP 128/90 | HR 101 | Temp 99.2°F | Resp 16 | Ht 67.5 in | Wt 162.0 lb

## 2015-03-18 DIAGNOSIS — R05 Cough: Secondary | ICD-10-CM | POA: Diagnosis not present

## 2015-03-18 DIAGNOSIS — M791 Myalgia, unspecified site: Secondary | ICD-10-CM

## 2015-03-18 DIAGNOSIS — J111 Influenza due to unidentified influenza virus with other respiratory manifestations: Secondary | ICD-10-CM | POA: Diagnosis not present

## 2015-03-18 DIAGNOSIS — R509 Fever, unspecified: Secondary | ICD-10-CM | POA: Diagnosis not present

## 2015-03-18 DIAGNOSIS — R059 Cough, unspecified: Secondary | ICD-10-CM

## 2015-03-18 LAB — POCT INFLUENZA A/B
INFLUENZA A, POC: POSITIVE — AB
Influenza B, POC: NEGATIVE

## 2015-03-18 MED ORDER — HYDROCODONE-HOMATROPINE 5-1.5 MG/5ML PO SYRP: ORAL_SOLUTION | ORAL | Status: DC

## 2015-03-18 MED ORDER — OSELTAMIVIR PHOSPHATE 75 MG PO CAPS: 75.0000 mg | ORAL_CAPSULE | Freq: Two times a day (BID) | ORAL | Status: DC

## 2015-03-18 NOTE — Patient Instructions (Signed)
Can start Tamiflu as discussed. Saline nasal spray at least 4 times per day if needed for nasal congestion, over the counter mucinex or mucinex DM as needed for cough, hydrocodone cough syrup at night if needed, tylenol or ibuprofen over the counter for fever and body aches, and drink plenty of fluids. Other information as in instructions below.  Return to the clinic or go to the nearest emergency room if any of your symptoms worsen or new symptoms occur.   Influenza, Adult Influenza ("the flu") is a viral infection of the respiratory tract. It occurs more often in winter months because people spend more time in close contact with one another. Influenza can make you feel very sick. Influenza easily spreads from person to person (contagious). CAUSES  Influenza is caused by a virus that infects the respiratory tract. You can catch the virus by breathing in droplets from an infected person's cough or sneeze. You can also catch the virus by touching something that was recently contaminated with the virus and then touching your mouth, nose, or eyes. RISKS AND COMPLICATIONS You may be at risk for a more severe case of influenza if you smoke cigarettes, have diabetes, have chronic heart disease (such as heart failure) or lung disease (such as asthma), or if you have a weakened immune system. Elderly people and pregnant women are also at risk for more serious infections. The most common problem of influenza is a lung infection (pneumonia). Sometimes, this problem can require emergency medical care and may be life threatening. SIGNS AND SYMPTOMS  Symptoms typically last 4 to 10 days and may include:  Fever.  Chills.  Headache, body aches, and muscle aches.  Sore throat.  Chest discomfort and cough.  Poor appetite.  Weakness or feeling tired.  Dizziness.  Nausea or vomiting. DIAGNOSIS  Diagnosis of influenza is often made based on your history and a physical exam. A nose or throat swab test can  be done to confirm the diagnosis. TREATMENT  In mild cases, influenza goes away on its own. Treatment is directed at relieving symptoms. For more severe cases, your health care provider may prescribe antiviral medicines to shorten the sickness. Antibiotic medicines are not effective because the infection is caused by a virus, not by bacteria. HOME CARE INSTRUCTIONS  Take medicines only as directed by your health care provider.  Use a cool mist humidifier to make breathing easier.  Get plenty of rest until your temperature returns to normal. This usually takes 3 to 4 days.  Drink enough fluid to keep your urine clear or pale yellow.  Cover yourmouth and nosewhen coughing or sneezing,and wash your handswellto prevent thevirusfrom spreading.  Stay homefromwork orschool untilthe fever is gonefor at least 60full day. PREVENTION  An annual influenza vaccination (flu shot) is the best way to avoid getting influenza. An annual flu shot is now routinely recommended for all adults in the [place] IF:  You experiencechest pain, yourcough worsens,or you producemore mucus.  Youhave nausea,vomiting, ordiarrhea.  Your fever returns or gets worse. SEEK IMMEDIATE MEDICAL CARE IF:  You havetrouble breathing, you become short of breath,or your skin ornails becomebluish.  You have severe painor stiffnessin the neck.  You develop a sudden headache, or pain in the face or ear.  You have nausea or vomiting that you cannot control. MAKE SURE YOU:   Understand these instructions.  Will watch your condition.  Will get help right away if you are not doing well or get worse.  This information is not intended to replace advice given to you by your health care provider. Make sure you discuss any questions you have with your health care provider.   Document Released: 12/31/1999 Document Revised: 01/23/2014 Document Reviewed: 04/03/2011 Elsevier Interactive  Patient Education [employer].

## 2015-03-18 NOTE — Progress Notes (Signed)
Subjective:    Patient ID: Robert Sandoval, male    DOB: May 02, 1960, 55 y.o.   MRN: [id number] By signing my name below, I, Zola Button, attest that this documentation has been prepared under the direction and in the presence of Merri Ray, MD.  Electronically Signed: Zola Button, Medical Scribe. 03/18/2015. 4:36 PM.  HPI HPI Comments: Robert Sandoval is a 55 y.o. male who presents to the [facility] complaining of gradual onset, dry cough that started yesterday after waking up. Patient reports having associated sneezing, generalized myalgias, chills, clear rhinorrhea, and headache. He tried Nyquil last night. He has been eating and drinking without any problems. Patient denies urinary symptoms. He also denies sick contacts. He did receive the flu shot this year.  Patient drives for [employer].   Patient Active Problem List   Diagnosis Date Noted  . Essential hypertension, benign 09/14/2014   Past Medical History  Diagnosis Date  . Hyperlipidemia   . Hypertension   . Allergy    No past surgical history on file. No Known Allergies Prior to Admission medications   Medication Sig Start Date End Date Taking? Authorizing Provider  hydrocortisone (ANUSOL-HC) 2.5 % rectal cream Place 1 application rectally at bedtime. 10/25/14  Yes Mancel Bale, PA-C  lisinopril (PRINIVIL,ZESTRIL) 20 MG tablet Take 1 tablet (20 mg total) by mouth daily. 03/12/15  Yes Mancel Bale, PA-C  loratadine (CLARITIN) 10 MG tablet Take 10 mg by mouth daily.   Yes Historical Provider, MD  polyethylene glycol powder (GLYCOLAX/MIRALAX) powder Take 17 g by mouth daily. 09/14/14  Yes Roselee Culver, MD   Social History   Social History  . Marital Status: Divorced    Spouse Name: N/A  . Number of Children: N/A  . Years of Education: N/A   Occupational History  . Not on file.   Social History Main Topics  . Smoking status: Never Smoker   . Smokeless tobacco: Not on file  . Alcohol  Use: Yes  . Drug Use: No  . Sexual Activity: Not on file   Other Topics Concern  . Not on file   Social History Narrative     Review of Systems  Constitutional: Positive for chills. Negative for appetite change.  HENT: Positive for rhinorrhea and sneezing.   Respiratory: Positive for cough.   Genitourinary: Negative.   Musculoskeletal: Positive for myalgias.  Neurological: Positive for headaches.       Objective:   Physical Exam  Constitutional: He is oriented to person, place, and time. He appears well-developed and well-nourished.  HENT:  Head: Normocephalic and atraumatic.  Right Ear: Tympanic membrane, external ear and ear canal normal.  Left Ear: Tympanic membrane, external ear and ear canal normal.  Nose: No rhinorrhea.  Mouth/Throat: Oropharynx is clear and moist and mucous membranes are normal. No oropharyngeal exudate or posterior oropharyngeal erythema.  Slight frontal sinus tenderness.  Eyes: Conjunctivae are normal. Pupils are equal, round, and reactive to light.  Neck: Neck supple.  Cardiovascular: Regular rhythm, normal heart sounds and intact distal pulses.  Tachycardia present.   No murmur heard. Slightly tachycardic.  Pulmonary/Chest: Effort normal and breath sounds normal. He has no wheezes. He has no rhonchi. He has no rales.  Clear to auscultation bilaterally.   Abdominal: Soft. There is no tenderness.  Lymphadenopathy:    He has no cervical adenopathy.  Neurological: He is alert and oriented to person, place, and time.  Skin: Skin is warm and dry.  No rash noted.  Psychiatric: He has a normal mood and affect. His behavior is normal.  Vitals reviewed.   Filed Vitals:   03/18/15 1405  BP: 128/90  Pulse: 101  Temp: 99.2 F (37.3 C)  TempSrc: Oral  Resp: 16  Height: 5' 7.5" (1.715 m)  Weight: 162 lb (73.483 kg)  SpO2: 98%         Assessment & Plan:   Robert Sandoval is a 55 y.o. male Cough - Plan: POCT Influenza A/B, HYDROcodone-homatropine  (HYCODAN) 5-1.5 MG/5ML syrup  Myalgia - Plan: POCT Influenza A/B  Fever chills - Plan: POCT Influenza A/B  Influenza with respiratory manifestation - Plan: oseltamivir (TAMIFLU) 75 MG capsule  Influenza. No concerning findings on exam. Tamiflu discussed, and will provide prescription. Side effects discussed. Symptomatic care with Mucinex or Mucinex DM, hydrocodone cough syrup if needed at night, RTC precautions  Meds ordered this encounter  Medications  . oseltamivir (TAMIFLU) 75 MG capsule    Sig: Take 1 capsule (75 mg total) by mouth 2 (two) times daily.    Dispense:  10 capsule    Refill:  0  . HYDROcodone-homatropine (HYCODAN) 5-1.5 MG/5ML syrup    Sig: 30m by mouth a bedtime as needed for cough.    Dispense:  120 mL    Refill:  0   Patient Instructions  Can start Tamiflu as discussed. Saline nasal spray at least 4 times per day if needed for nasal congestion, over the counter mucinex or mucinex DM as needed for cough, hydrocodone cough syrup at night if needed, tylenol or ibuprofen over the counter for fever and body aches, and drink plenty of fluids. Other information as in instructions below.  Return to the clinic or go to the nearest emergency room if any of your symptoms worsen or new symptoms occur.   Influenza, Adult Influenza ("the flu") is a viral infection of the respiratory tract. It occurs more often in winter months because people spend more time in close contact with one another. Influenza can make you feel very sick. Influenza easily spreads from person to person (contagious). CAUSES  Influenza is caused by a virus that infects the respiratory tract. You can catch the virus by breathing in droplets from an infected person's cough or sneeze. You can also catch the virus by touching something that was recently contaminated with the virus and then touching your mouth, nose, or eyes. RISKS AND COMPLICATIONS You may be at risk for a more severe case of influenza if you  smoke cigarettes, have diabetes, have chronic heart disease (such as heart failure) or lung disease (such as asthma), or if you have a weakened immune system. Elderly people and pregnant women are also at risk for more serious infections. The most common problem of influenza is a lung infection (pneumonia). Sometimes, this problem can require emergency medical care and may be life threatening. SIGNS AND SYMPTOMS  Symptoms typically last 4 to 10 days and may include:  Fever.  Chills.  Headache, body aches, and muscle aches.  Sore throat.  Chest discomfort and cough.  Poor appetite.  Weakness or feeling tired.  Dizziness.  Nausea or vomiting. DIAGNOSIS  Diagnosis of influenza is often made based on your history and a physical exam. A nose or throat swab test can be done to confirm the diagnosis. TREATMENT  In mild cases, influenza goes away on its own. Treatment is directed at relieving symptoms. For more severe cases, your health care provider may prescribe antiviral medicines  to shorten the sickness. Antibiotic medicines are not effective because the infection is caused by a virus, not by bacteria. HOME CARE INSTRUCTIONS  Take medicines only as directed by your health care provider.  Use a cool mist humidifier to make breathing easier.  Get plenty of rest until your temperature returns to normal. This usually takes 3 to 4 days.  Drink enough fluid to keep your urine clear or pale yellow.  Cover yourmouth and nosewhen coughing or sneezing,and wash your handswellto prevent thevirusfrom spreading.  Stay homefromwork orschool untilthe fever is gonefor at least 35full day. PREVENTION  An annual influenza vaccination (flu shot) is the best way to avoid getting influenza. An annual flu shot is now routinely recommended for all adults in the [place] IF:  You experiencechest pain, yourcough worsens,or you producemore mucus.  Youhave nausea,vomiting,  ordiarrhea.  Your fever returns or gets worse. SEEK IMMEDIATE MEDICAL CARE IF:  You havetrouble breathing, you become short of breath,or your skin ornails becomebluish.  You have severe painor stiffnessin the neck.  You develop a sudden headache, or pain in the face or ear.  You have nausea or vomiting that you cannot control. MAKE SURE YOU:   Understand these instructions.  Will watch your condition.  Will get help right away if you are not doing well or get worse.   This information is not intended to replace advice given to you by your health care provider. Make sure you discuss any questions you have with your health care provider.   Document Released: 12/31/1999 Document Revised: 01/23/2014 Document Reviewed: 04/03/2011 Elsevier Interactive Patient Education [employer].       I personally performed the services described in this documentation, which was scribed in my presence. The recorded information has been reviewed and considered, and addended by me as needed.

## 2015-05-01 ENCOUNTER — Ambulatory Visit (INDEPENDENT_AMBULATORY_CARE_PROVIDER_SITE_OTHER): Payer: Managed Care, Other (non HMO) | Admitting: Physician Assistant

## 2015-05-01 VITALS — BP 120/82 | HR 97 | Temp 97.9°F | Resp 16 | Ht 67.5 in | Wt 162.4 lb

## 2015-05-01 DIAGNOSIS — J069 Acute upper respiratory infection, unspecified: Secondary | ICD-10-CM

## 2015-05-01 DIAGNOSIS — B9789 Other viral agents as the cause of diseases classified elsewhere: Principal | ICD-10-CM

## 2015-05-01 MED ORDER — HYDROCODONE-HOMATROPINE 5-1.5 MG/5ML PO SYRP: 2.5000 mL | ORAL_SOLUTION | Freq: Every day | ORAL | Status: AC

## 2015-05-01 MED ORDER — NAPROXEN 500 MG PO TABS: 500.0000 mg | ORAL_TABLET | Freq: Two times a day (BID) | ORAL | Status: DC

## 2015-05-01 NOTE — Progress Notes (Signed)
   05/01/2015 1:42 PM   DOB: 03-24-1960 / MRN: [id number]  SUBJECTIVE:  Robert Sandoval is a 55 y.o. male presenting for sore throat that started three days ago and resolved on day 2 of total illness. Associates nasal congestion, cough, HA, and feeling fatigue.  Denies fever, chills, chest pain, SOB, DOE, and leg swelling.  Has tried Mucinex without relief.  Feels he is getting worse.  States this problem happens every year about this time.   He has No Known Allergies.   He  has a past medical history of Hyperlipidemia; Hypertension; and Allergy.    He  reports that he has never smoked. He does not have any smokeless tobacco history on file. He reports that he drinks alcohol. He reports that he does not use illicit drugs. He  has no sexual activity history on file. The patient  has no past surgical history on file.  His family history includes Asthma in his father; Hyperlipidemia in his father.  Review of Systems  Constitutional: Positive for malaise/fatigue. Negative for fever, chills and diaphoresis.  HENT: Positive for congestion and sore throat.   Respiratory: Positive for cough. Negative for hemoptysis, shortness of breath and wheezing.   Cardiovascular: Negative for chest pain.  Gastrointestinal: Negative for nausea.  Skin: Negative for rash.  Neurological: Negative for dizziness and weakness.  Endo/Heme/Allergies: Negative for polydipsia.    Problem list and medications reviewed and updated by myself where necessary, and exist elsewhere in the encounter.   OBJECTIVE:  BP 120/82 mmHg  Pulse 97  Temp(Src) 97.9 F (36.6 C) (Oral)  Resp 16  Ht 5' 7.5" (1.715 m)  Wt 162 lb 6.4 oz (73.664 kg)  BMI 25.05 kg/m2  SpO2 97%  Physical Exam  Constitutional: He is oriented to person, place, and time. He appears well-developed. He does not appear ill.  HENT:  Right Ear: Tympanic membrane normal.  Left Ear: Tympanic membrane normal.  Nose: Nose normal.  Mouth/Throat: Uvula is midline,  oropharynx is clear and moist and mucous membranes are normal.  Eyes: Conjunctivae and EOM are normal. Pupils are equal, round, and reactive to light.  Cardiovascular: Normal rate.   Pulmonary/Chest: Effort normal.  Abdominal: He exhibits no distension.  Musculoskeletal: Normal range of motion.  Neurological: He is alert and oriented to person, place, and time. No cranial nerve deficit. Coordination normal.  Skin: Skin is warm and dry. He is not diaphoretic.  Psychiatric: He has a normal mood and affect.  Nursing note and vitals reviewed.   No results found for this or any previous visit (from the past 72 hour(s)).  No results found.  ASSESSMENT AND PLAN  Robert Sandoval was seen today for sinus problem.  Diagnoses and all orders for this visit:  Viral URI with cough -     naproxen (NAPROSYN) 500 MG tablet; Take 1 tablet (500 mg total) by mouth 2 (two) times daily with a meal. -     HYDROcodone-homatropine (HYCODAN) 5-1.5 MG/5ML syrup; Take 2.5-5 mLs by mouth at bedtime.    The patient was advised to call or return to clinic if he does not see an improvement in symptoms or to seek the care of the closest emergency department if he worsens with the above plan.   Philis Fendt, MHS, PA-C [facility] 05/01/2015 1:42 PM

## 2015-05-01 NOTE — Patient Instructions (Addendum)
-   You have a viral upper respiratory infection, (the common cold) and it is not uncommon for symptoms to last 10 days to 2 weeks, regardless of which medications you take. Unfortunately antibiotics will not help your symptoms as they target bacteria, and you are likely suffering from a virus. Additionally, 1 in 8 people who take antibiotics suffer mild to severe side effects.    - I also recommend that you take Zyrtec-D 5-120 every morning for the next five to 10 days. This medication will help you with nasal congestion, post nasal drip and sneezing. You will have to purchase this directly from the pharmacist     IF you received an x-ray today, you will receive an invoice from [facility]. Please contact [facility] at [phone] with questions or concerns regarding your invoice.   IF you received labwork today, you will receive an invoice from [facility]. Please contact [facility] at [phone] with questions or concerns regarding your invoice.   Our billing staff will not be able to assist you with questions regarding bills from these companies.  You will be contacted with the lab results as soon as they are available. The fastest way to get your results is to activate your My Chart account. Instructions are located on the last page of this paperwork. If you have not heard from [place] regarding the results in 2 weeks, please contact this office.

## 2015-06-23 ENCOUNTER — Telehealth: Payer: Self-pay | Admitting: Physician Assistant

## 2015-06-23 NOTE — Telephone Encounter (Signed)
Patient request a refill on Lisinopril. [facility] on [address]. [phone].

## 2015-06-24 MED ORDER — LISINOPRIL 20 MG PO TABS: 20.0000 mg | ORAL_TABLET | Freq: Every day | ORAL | Status: DC

## 2015-06-24 NOTE — Telephone Encounter (Signed)
Rx filled for one month and he is to come back in for follow up before he runs out.  Pt notified.

## 2015-07-13 ENCOUNTER — Ambulatory Visit (INDEPENDENT_AMBULATORY_CARE_PROVIDER_SITE_OTHER): Payer: Managed Care, Other (non HMO) | Admitting: Urgent Care

## 2015-07-13 VITALS — BP 136/80 | HR 66 | Temp 97.7°F | Resp 18 | Ht 67.5 in | Wt 166.0 lb

## 2015-07-13 DIAGNOSIS — I10 Essential (primary) hypertension: Secondary | ICD-10-CM | POA: Diagnosis not present

## 2015-07-13 DIAGNOSIS — Z0000 Encounter for general adult medical examination without abnormal findings: Secondary | ICD-10-CM

## 2015-07-13 LAB — LIPID PANEL
CHOL/HDL RATIO: 5.3 ratio — AB (ref <=5.0)
CHOLESTEROL: 202 mg/dL — AB (ref 125–200)
HDL: 38 mg/dL — AB (ref >=40)
LDL Cholesterol: 127 mg/dL (ref <130)
Triglycerides: 183 mg/dL — ABNORMAL HIGH (ref <150)
VLDL: 37 mg/dL — ABNORMAL HIGH (ref <30)

## 2015-07-13 LAB — CBC
HEMATOCRIT: 45.2 % (ref 38.5–50.0)
HEMOGLOBIN: 15.2 g/dL (ref 13.2–17.1)
MCH: 29 pg (ref 27.0–33.0)
MCHC: 33.6 g/dL (ref 32.0–36.0)
MCV: 86.1 fL (ref 80.0–100.0)
MPV: 9.4 fL (ref 7.5–12.5)
Platelets: 266 10*3/uL (ref 140–400)
RBC: 5.25 MIL/uL (ref 4.20–5.80)
RDW: 14.1 % (ref 11.0–15.0)
WBC: 4.8 10*3/uL (ref 3.8–10.8)

## 2015-07-13 LAB — COMPLETE METABOLIC PANEL WITH GFR
ALBUMIN: 4.3 g/dL (ref 3.6–5.1)
ALK PHOS: 79 U/L (ref 40–115)
ALT: 19 U/L (ref 9–46)
AST: 19 U/L (ref 10–35)
BUN: 10 mg/dL (ref 7–25)
CALCIUM: 9.3 mg/dL (ref 8.6–10.3)
CO2: 22 mmol/L (ref 20–31)
Chloride: 105 mmol/L (ref 98–110)
Creat: 0.78 mg/dL (ref 0.70–1.33)
Glucose, Bld: 91 mg/dL (ref 65–99)
POTASSIUM: 4.1 mmol/L (ref 3.5–5.3)
SODIUM: 141 mmol/L (ref 135–146)
Total Bilirubin: 1.1 mg/dL (ref 0.2–1.2)
Total Protein: 7.2 g/dL (ref 6.1–8.1)

## 2015-07-13 LAB — TSH: TSH: 2.37 mIU/L (ref 0.40–4.50)

## 2015-07-13 LAB — MICROALBUMIN, URINE: Microalb, Ur: 0.3 mg/dL

## 2015-07-13 MED ORDER — LISINOPRIL 20 MG PO TABS: 20.0000 mg | ORAL_TABLET | Freq: Every day | ORAL | Status: DC

## 2015-07-13 NOTE — Progress Notes (Signed)
MRN: [id number]  Subjective:   [name] is a 55 y.o. male presenting for annual physical exam and medication refill.  Medical care team includes: PCP: No primary care provider on file. Vision: Wears eye glasses, plans on going back for follow up this year. Dental: Gets dental cleanings regularly. Specialists: None.   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 Has good relationships at home. Denies smoking cigarettes, has ~1 alcohol drink per week.  Health Maintenance - Had colonoscopy done 2015, is on 5 year follow up.  Robert Sandoval has Essential hypertension, benign on his problem list.  Robert Sandoval has a current medication list which includes the following prescription(s): hydrocortisone, lisinopril, loratadine, and polyethylene glycol powder. He has No Known Allergies.  Robert Sandoval  has a past medical history of Hyperlipidemia; Hypertension; and Allergy. Also  has no past surgical history on file.  His family history includes Asthma in his father; Hyperlipidemia in his father.  Immunizations: TDAP is up to date.  Review of Systems  Constitutional: Negative for fever, chills, weight loss, malaise/fatigue and diaphoresis.  HENT: Negative for congestion, ear discharge, ear pain, hearing loss, nosebleeds, sore throat and tinnitus.   Eyes: Negative for blurred vision, double vision, photophobia, pain, discharge and redness.  Respiratory: Negative for cough, shortness of breath and wheezing.   Cardiovascular: Negative for chest pain, palpitations and leg swelling.  Gastrointestinal: Negative for nausea, vomiting, abdominal pain, diarrhea, constipation and blood in stool.  Genitourinary: Negative for dysuria, urgency, frequency, hematuria and flank pain.  Musculoskeletal: Negative for myalgias, back pain and joint pain.  Skin: Negative for itching and rash.  Neurological: Negative for dizziness, tingling, seizures, loss of consciousness, weakness and  headaches.  Endo/Heme/Allergies: Negative for polydipsia.  Psychiatric/Behavioral: Negative for depression, suicidal ideas, hallucinations, memory loss and substance abuse. The patient is not nervous/anxious and does not have insomnia.    Objective:   Vitals: BP 136/80 mmHg  Pulse 66  Temp(Src) 97.7 F (36.5 C) (Oral)  Resp 18  Ht 5' 7.5" (1.715 m)  Wt 166 lb (75.297 kg)  BMI 25.60 kg/m2  SpO2 99%  Physical Exam  Constitutional: He is oriented to person, place, and time. He appears well-developed and well-nourished.  HENT:  TM's intact bilaterally, no effusions or erythema. Nasal turbinates pink and moist, nasal passages patent. No sinus tenderness. Oropharynx clear, mucous membranes moist, dentition in good repair.  Eyes: Conjunctivae and EOM are normal. Pupils are equal, round, and reactive to light. Right eye exhibits no discharge. Left eye exhibits no discharge. No scleral icterus.  Neck: Normal range of motion. Neck supple. No thyromegaly present.  Cardiovascular: Normal rate, regular rhythm and intact distal pulses.  Exam reveals no gallop and no friction rub.   No murmur heard. Pulmonary/Chest: No stridor. No respiratory distress. He has no wheezes. He has no rales.  Abdominal: Soft. Bowel sounds are normal. He exhibits no distension and no mass. There is no tenderness.  Musculoskeletal: Normal range of motion. He exhibits no edema or tenderness.  Lymphadenopathy:    He has no cervical adenopathy.  Neurological: He is alert and oriented to person, place, and time. He has normal reflexes.  Skin: Skin is warm and dry. No rash noted. No erythema. No pallor.  Psychiatric: He has a normal mood and affect.   Assessment and Plan :   1. Annual physical exam - Medically healthy, pleasant person. Discussed healthy lifestyle, diet, exercise, preventative care, vaccinations, and addressed patient's  concerns.   2. Essential hypertension - Well controlled, refills  provided.  Robert Eagles, PA-C [facility] [phone] 07/13/2015  10:34 AM

## 2015-07-13 NOTE — Patient Instructions (Addendum)
Keeping you healthy  Get these tests  Blood pressure- Have your blood pressure checked once a year by your healthcare provider.  Normal blood pressure is 120/80  Weight- Have your body mass index (BMI) calculated to screen for obesity.  BMI is a measure of body fat based on height and weight. You can also calculate your own BMI at [email or web address].[email or web address]/bmi/.  Cholesterol- Have your cholesterol checked every year.  Diabetes- Have your blood sugar checked regularly if you have high blood pressure, high cholesterol, have a family history of diabetes or if you are overweight.  Screening for Colon Cancer- Colonoscopy starting at age 50.  Screening may begin sooner depending on your family history and other health conditions. Follow up colonoscopy as directed by your Gastroenterologist.  Screening for Prostate Cancer- Both blood work (PSA) and a rectal exam help screen for Prostate Cancer.  Screening begins at age 40 with African-American men and at age 50 with Caucasian men.  Screening may begin sooner depending on your family history.  Take these medicines  Aspirin- One aspirin daily can help prevent Heart disease and Stroke.  Flu shot- Every fall.  Tetanus- Every 10 years.  Zostavax- Once after the age of 60 to prevent Shingles.  Pneumonia shot- Once after the age of 65; if you are younger than 65, ask your healthcare provider if you need a Pneumonia shot.  Take these steps  Don't smoke- If you do smoke, talk to your doctor about quitting.  For tips on how to quit, go to www.smokefree.gov or call 1-800-QUIT-NOW.  Be physically active- Exercise 5 days a week for at least 30 minutes.  If you are not already physically active start slow and gradually work up to 30 minutes of moderate physical activity.  Examples of moderate activity include walking briskly, mowing the yard, dancing, swimming, bicycling, etc.  Eat a healthy diet- Eat a variety of healthy food such as fruits, vegetables, low  fat milk, low fat cheese, yogurt, lean meant, poultry, fish, beans, tofu, etc. For more information go to www.thenutritionsource.org  Drink alcohol in moderation- Limit alcohol intake to less than two drinks a day. Never drink and drive.  Dentist- Brush and floss twice daily; visit your dentist twice a year.  Depression- Your emotional health is as important as your physical health. If you're feeling down, or losing interest in things you would normally enjoy please talk to your healthcare provider.  Eye exam- Visit your eye doctor every year.  Safe sex- If you may be exposed to a sexually transmitted infection, use a condom.  Seat belts- Seat belts can save your life; always wear one.  Smoke/Carbon Monoxide detectors- These detectors need to be installed on the appropriate level of your home.  Replace batteries at least once a year.  Skin cancer- When out in the sun, cover up and use sunscreen 15 SPF or higher.  Violence- If anyone is threatening you, please tell your healthcare provider.  Living Will/ Health care power of attorney- Speak with your healthcare provider and family.    IF you received an x-ray today, you will receive an invoice from [facility]. Please contact [facility] at [phone] with questions or concerns regarding your invoice.   IF you received labwork today, you will receive an invoice from [facility]. Please contact [facility] at [phone] with questions or concerns regarding your invoice.   Our billing staff will not be able to assist you with questions regarding bills from these companies.    You will be contacted with the lab results as soon as they are available. The fastest way to get your results is to activate your My Chart account. Instructions are located on the last page of this paperwork. If you have not heard from us regarding the results in 2 weeks, please contact this office.      

## 2015-11-23 ENCOUNTER — Encounter: Payer: Self-pay | Admitting: Physician Assistant

## 2015-11-23 DIAGNOSIS — K648 Other hemorrhoids: Secondary | ICD-10-CM | POA: Insufficient documentation

## 2015-11-23 DIAGNOSIS — K5730 Diverticulosis of large intestine without perforation or abscess without bleeding: Secondary | ICD-10-CM | POA: Insufficient documentation

## 2015-11-23 DIAGNOSIS — K6389 Other specified diseases of intestine: Secondary | ICD-10-CM | POA: Insufficient documentation

## 2015-11-23 DIAGNOSIS — K644 Residual hemorrhoidal skin tags: Secondary | ICD-10-CM | POA: Insufficient documentation

## 2016-03-26 ENCOUNTER — Other Ambulatory Visit: Payer: Self-pay | Admitting: Physician Assistant

## 2016-03-26 DIAGNOSIS — L290 Pruritus ani: Secondary | ICD-10-CM

## 2016-03-26 MED ORDER — HYDROCORTISONE 2.5 % RE CREA: 1.0000 "application " | TOPICAL_CREAM | Freq: Every day | RECTAL | 0 refills | Status: DC

## 2016-08-03 ENCOUNTER — Other Ambulatory Visit: Payer: Self-pay | Admitting: Urgent Care

## 2016-08-03 NOTE — Telephone Encounter (Signed)
mychart message sent to pt about making an apt °

## 2016-08-05 ENCOUNTER — Ambulatory Visit (INDEPENDENT_AMBULATORY_CARE_PROVIDER_SITE_OTHER): Payer: Managed Care, Other (non HMO) | Admitting: Family Medicine

## 2016-08-05 ENCOUNTER — Encounter: Payer: Self-pay | Admitting: Family Medicine

## 2016-08-05 VITALS — BP 133/86 | HR 83 | Temp 98.9°F | Resp 16 | Ht 65.75 in | Wt 160.0 lb

## 2016-08-05 DIAGNOSIS — K649 Unspecified hemorrhoids: Secondary | ICD-10-CM | POA: Diagnosis not present

## 2016-08-05 DIAGNOSIS — R3911 Hesitancy of micturition: Secondary | ICD-10-CM

## 2016-08-05 DIAGNOSIS — R351 Nocturia: Secondary | ICD-10-CM | POA: Diagnosis not present

## 2016-08-05 DIAGNOSIS — I10 Essential (primary) hypertension: Secondary | ICD-10-CM | POA: Insufficient documentation

## 2016-08-05 DIAGNOSIS — Z202 Contact with and (suspected) exposure to infections with a predominantly sexual mode of transmission: Secondary | ICD-10-CM

## 2016-08-05 MED ORDER — LISINOPRIL 20 MG PO TABS: 20.0000 mg | ORAL_TABLET | Freq: Every day | ORAL | 3 refills | Status: DC

## 2016-08-05 NOTE — Progress Notes (Signed)
Patient ID: Robert Sandoval, male    DOB: February 28, 1960  Age: 57 y.o. MRN: [id number]  Chief Complaint  Patient presents with  . Medication Refill    Lisinopril 20 mg    Subjective:     Patient was here for a BP Check.  Wanted basic labs done.  Also was concerned about urinary difficulty.  Somehow I failed to document my note at the time.  Current allergies, medications, problem list, past/family and social histories reviewed.  Objective:  BP 133/86   Pulse 83   Temp 98.9 F (37.2 C) (Oral)   Resp 16   Ht 5' 5.75" (1.67 m)   Wt 160 lb (72.6 kg)   SpO2 100%   BMI 26.02 kg/m   Exam was unremarkable but did not document at the time.   Assessment & Plan:   Assessment: 1. Hypertension, unspecified type   2. Hemorrhoids, unspecified hemorrhoid type   3. Urinary hesitancy   4. Nocturia   5. Possible exposure to STD       Plan: See instructions.  He asked me to check labs.  Orders Placed This Encounter  Procedures  . GC/Chlamydia Probe Amp  . Comprehensive metabolic panel  . Lipid panel  . HIV antibody  . RPR  . PSA  . Ambulatory referral to Urology    Referral Priority:   Routine    Referral Type:   Consultation    Referral Reason:   Specialty Services Required    Referred to Provider:   Rana Snare, MD    Requested Specialty:   Urology    Number of Visits Requested:   1    Meds ordered this encounter  Medications  . lisinopril (PRINIVIL,ZESTRIL) 20 MG tablet    Sig: Take 1 tablet (20 mg total) by mouth daily.    Dispense:  90 tablet    Refill:  3         Patient Instructions   Referral is being made to urology. Check with your insurance plan to make certain that [facility] is covered.  Continue same blood pressure medication  Let us know if the hemorrhoids give more troubles  Return in about 6-12 months or as needed    IF you received an x-ray today, you will receive an invoice from [facility] Radiology. Please contact [facility]  Radiology at [phone] with questions or concerns regarding your invoice.   IF you received labwork today, you will receive an invoice from [facility]. Please contact [facility] at [phone] with questions or concerns regarding your invoice.   Our billing staff will not be able to assist you with questions regarding bills from these companies.  You will be contacted with the lab results as soon as they are available. The fastest way to get your results is to activate your My Chart account. Instructions are located on the last page of this paperwork. If you have not heard from [place] regarding the results in 2 weeks, please contact this office.        Return in about 6 months (around 02/05/2017).   HOPPER,DAVID, MD 08/14/2016

## 2016-08-05 NOTE — Patient Instructions (Addendum)
Referral is being made to urology. Check with your insurance plan to make certain that [facility] is covered.  Continue same blood pressure medication  Let us know if the hemorrhoids give more troubles  Return in about 6-12 months or as needed    IF you received an x-ray today, you will receive an invoice from [facility] Radiology. Please contact [facility] Radiology at [phone] with questions or concerns regarding your invoice.   IF you received labwork today, you will receive an invoice from [facility]. Please contact [facility] at [phone] with questions or concerns regarding your invoice.   Our billing staff will not be able to assist you with questions regarding bills from these companies.  You will be contacted with the lab results as soon as they are available. The fastest way to get your results is to activate your My Chart account. Instructions are located on the last page of this paperwork. If you have not heard from [place] regarding the results in 2 weeks, please contact this office.

## 2016-08-06 LAB — COMPREHENSIVE METABOLIC PANEL
ALBUMIN: 4.4 g/dL (ref 3.5–5.5)
ALK PHOS: 84 IU/L (ref 39–117)
ALT: 26 IU/L (ref 0–44)
AST: 25 IU/L (ref 0–40)
Albumin/Globulin Ratio: 1.5 (ref 1.2–2.2)
BUN/Creatinine Ratio: 11 (ref 9–20)
BUN: 8 mg/dL (ref 6–24)
Bilirubin Total: 0.8 mg/dL (ref 0.0–1.2)
CALCIUM: 9.3 mg/dL (ref 8.7–10.2)
CO2: 20 mmol/L (ref 20–29)
Chloride: 105 mmol/L (ref 96–106)
Creatinine, Ser: 0.71 mg/dL — ABNORMAL LOW (ref 0.76–1.27)
GFR calc Af Amer: 121 mL/min/{1.73_m2} (ref >59)
GFR, EST NON AFRICAN AMERICAN: 105 mL/min/{1.73_m2} (ref >59)
GLOBULIN, TOTAL: 2.9 g/dL (ref 1.5–4.5)
GLUCOSE: 96 mg/dL (ref 65–99)
Potassium: 4.1 mmol/L (ref 3.5–5.2)
SODIUM: 140 mmol/L (ref 134–144)
Total Protein: 7.3 g/dL (ref 6.0–8.5)

## 2016-08-06 LAB — LIPID PANEL
CHOL/HDL RATIO: 5.7 ratio — AB (ref 0.0–5.0)
CHOLESTEROL TOTAL: 172 mg/dL (ref 100–199)
HDL: 30 mg/dL — ABNORMAL LOW (ref >39)
LDL CALC: 76 mg/dL (ref 0–99)
TRIGLYCERIDES: 330 mg/dL — AB (ref 0–149)
VLDL Cholesterol Cal: 66 mg/dL — ABNORMAL HIGH (ref 5–40)

## 2016-08-06 LAB — RPR: RPR: NONREACTIVE

## 2016-08-06 LAB — PSA: PROSTATE SPECIFIC AG, SERUM: 2.1 ng/mL (ref 0.0–4.0)

## 2016-08-06 LAB — HIV ANTIBODY (ROUTINE TESTING W REFLEX): HIV Screen 4th Generation wRfx: NONREACTIVE

## 2016-08-08 LAB — GC/CHLAMYDIA PROBE AMP
Chlamydia trachomatis, NAA: NEGATIVE
Neisseria gonorrhoeae by PCR: NEGATIVE

## 2016-10-30 DIAGNOSIS — N402 Nodular prostate without lower urinary tract symptoms: Secondary | ICD-10-CM

## 2017-05-08 ENCOUNTER — Other Ambulatory Visit: Payer: Self-pay

## 2017-05-08 ENCOUNTER — Emergency Department (HOSPITAL_COMMUNITY)
Admission: EM | Admit: 2017-05-08 | Discharge: 2017-05-08 | Disposition: A | Discharge status: Home / Self Care | Payer: Self-pay | Attending: Emergency Medicine | Admitting: Emergency Medicine

## 2017-05-08 ENCOUNTER — Encounter (HOSPITAL_COMMUNITY): Payer: Self-pay

## 2017-05-08 DIAGNOSIS — I10 Essential (primary) hypertension: Secondary | ICD-10-CM | POA: Insufficient documentation

## 2017-05-08 DIAGNOSIS — L290 Pruritus ani: Secondary | ICD-10-CM

## 2017-05-08 DIAGNOSIS — Z79899 Other long term (current) drug therapy: Secondary | ICD-10-CM | POA: Insufficient documentation

## 2017-05-08 DIAGNOSIS — K649 Unspecified hemorrhoids: Secondary | ICD-10-CM | POA: Insufficient documentation

## 2017-05-08 LAB — URINALYSIS, ROUTINE W REFLEX MICROSCOPIC
Bilirubin Urine: NEGATIVE
GLUCOSE, UA: NEGATIVE mg/dL
Hgb urine dipstick: NEGATIVE
KETONES UR: NEGATIVE mg/dL
LEUKOCYTES UA: NEGATIVE
NITRITE: NEGATIVE
PH: 7 (ref 5.0–8.0)
PROTEIN: NEGATIVE mg/dL
Specific Gravity, Urine: 1.008 (ref 1.005–1.030)

## 2017-05-08 MED ORDER — HYDROCORTISONE 2.5 % RE CREA: 1.0000 "application " | TOPICAL_CREAM | Freq: Every day | RECTAL | 0 refills | Status: DC

## 2017-05-08 NOTE — ED Notes (Addendum)
Patient has large bleeding hemmoroids. PA at bedside to perform rectal exam, RN chaperone.

## 2017-05-08 NOTE — ED Notes (Signed)
Pt reports he did not take his BP medication this AM. Pt instructed to take his medication as prescribed when he gets gome.

## 2017-05-08 NOTE — ED Provider Notes (Signed)
[facility] EMERGENCY DEPARTMENT Provider Note   CSN: [id number] Arrival date & time: 05/08/17  1000     History   Chief Complaint Chief Complaint  Patient presents with  . Hemorrhoids    HPI Robert Sandoval is a 57 y.o. male presenting for evaluation of hemorrhoids.  Patient states he has a history of internal and external hemorrhoids.  He saw GI doctor about 9 years ago, he told him he would likely need to follow-up with surgery.  He normally manages his hemorrhoids without problems, however on Sunday the symptoms became much worse.  [place] his flight was approximate 4 hours.  After this is when his symptoms worsened.  He reports minimal bleeding from his rectum, and significant pain.  He has tried warm baths without improvement of symptoms.  He has not tried anything else including Tylenol, ibuprofen, or steroids.  He reports increased pain with urination, states he is urinating frequently, although not much comes out.  No dysuria or hematuria.  He denies fevers, chills, chest pain, shortness breath, nausea, vomiting, upper abdominal pain.  Last bowel movement was yesterday around 10 PM, patient states it was loose but otherwise normal.  His previous bowel movements have been normal.  Has medical history includes HTN, BPH, chronic back pain, and hemorrhoids.  He takes gabapentin, tamsulosin, lisinopril, has not had his medications this morning.  HPI  Past Medical History:  Diagnosis Date  . Allergy   . Hyperlipidemia   . Hypertension     Patient Active Problem List   Diagnosis Date Noted  . Prostate nodule 10/30/2016  . Hypertension 08/05/2016  . External hemorrhoids without complication 32/44/0102  . Melanosis of colon 11/23/2015  . Diverticulosis of colon without hemorrhage 11/23/2015  . Internal hemorrhoids 11/23/2015  . Essential hypertension, benign 09/14/2014    History reviewed. No pertinent surgical  history.      Home Medications    Prior to Admission medications   Medication Sig Start Date End Date Taking? Authorizing Provider  hydrocortisone (ANUSOL-HC) 2.5 % rectal cream Place 1 application rectally at bedtime. 05/08/17   Hydie Langan, PA-C  lisinopril (PRINIVIL,ZESTRIL) 20 MG tablet Take 1 tablet (20 mg total) by mouth daily. 08/05/16   Posey Boyer, MD  loratadine (CLARITIN) 10 MG tablet Take 10 mg by mouth daily.    [provider]  polyethylene glycol powder (GLYCOLAX/MIRALAX) powder Take 17 g by mouth daily. 09/14/14   Roselee Culver, MD    Family History Family History  Problem Relation Age of Onset  . Hyperlipidemia Father   . Asthma Father     Social History Social History   Tobacco Use  . Smoking status: Never Smoker  . Smokeless tobacco: Never Used  Substance Use Topics  . Alcohol use: Yes  . Drug use: No     Allergies   Patient has no known allergies.   Review of Systems Review of Systems  Constitutional: Negative for chills and fever.  Respiratory: Negative for shortness of breath.   Cardiovascular: Negative for chest pain.  Gastrointestinal: Positive for anal bleeding.       Hemorrhoids   Genitourinary: Positive for frequency. Negative for dysuria, flank pain, hematuria, penile pain, penile swelling and scrotal swelling.  Musculoskeletal: Negative for myalgias.  Skin: Negative for rash.  Allergic/Immunologic: Negative for immunocompromised state.  Neurological: Negative for headaches.  Hematological: Does not bruise/bleed easily.     Physical Exam Updated Vital Signs BP Marland Kitchen)  165/116 (BP Location: Left Arm)   Pulse 70   Temp 98.3 F (36.8 C) (Oral)   Resp 14   SpO2 99%   Physical Exam  Constitutional: He is oriented to person, place, and time. He appears well-developed and well-nourished. No distress.  HENT:  Head: Normocephalic and atraumatic.  Eyes: Pupils are equal, round, and reactive to light. EOM are  normal.  Neck: Normal range of motion. Neck supple.  Cardiovascular: Normal rate, regular rhythm and intact distal pulses.  Pulmonary/Chest: Effort normal and breath sounds normal. No respiratory distress. He has no wheezes.  Abdominal: Soft. He exhibits no distension and no mass. There is no tenderness. There is no rebound and no guarding.  No tenderness palpation of the abdomen.  Soft without rigidity, guarding, or distention.  Genitourinary: Rectal exam shows external hemorrhoid. Prostate is not tender.  Genitourinary Comments: Chaperone present.  Large hemorrhoids without obvious bleeding.  Prostate nontender.  Musculoskeletal: Normal range of motion.  Neurological: He is alert and oriented to person, place, and time.  Skin: Skin is warm and dry.  Psychiatric: He has a normal mood and affect.  Nursing note and vitals reviewed.    ED Treatments / Results  Labs (all labs ordered are listed, but only abnormal results are displayed) Labs Reviewed  URINALYSIS, ROUTINE W REFLEX MICROSCOPIC    EKG None  Radiology No results found.  Procedures Procedures (including critical care time)  Medications Ordered in ED Medications - No data to display   Initial Impression / Assessment and Plan / ED Course  I have reviewed the triage vital signs and the nursing notes.  Pertinent labs & imaging results that were available during my care of the patient were reviewed by me and considered in my medical decision making (see chart for details).     Patient presenting for evaluation of hemorrhoids.  Physical exam shows severely engorged enlarged hemorrhoids without active bleeding.  Case discussed with attending, Dr. Rex Kras agrees to plan.  Will consult with general surgery.  Discussed with Maylon Peppers, from [facility].  Recommends follow-up in office with possible incision today.  Discussed with attending, Dr. Rex Kras recommends patient has procedure performed by general surgery instead of in the  ER, due to high risk of continued bleeding.  Discussed plan with patient.  Patient reports he has had an episode of bleeding since rectal exam, but at this time, bleeding has stopped.  Encourage patient to follow-up with [facility] for further management.  Will give steroid cream while waiting.  Discussed Tylenol for pain relief.  At this time, patient appears safe for discharge.  Return precautions given, including severe or persistent bleeding.  Patient states he understands and agrees to plan.  Final Clinical Impressions(s) / ED Diagnoses   Final diagnoses:  Hemorrhoids, unspecified hemorrhoid type    ED Discharge Orders        Ordered    hydrocortisone (ANUSOL-HC) 2.5 % rectal cream  Daily at bedtime     05/08/17 [place], Mycah Mcdougall, PA-C 05/08/17 1646    Little, Wenda Overland, MD 05/08/17 [phone]

## 2017-05-08 NOTE — ED Triage Notes (Signed)
Pt reports hemorrhoids that have been progressively worse since last night. He states hx of hemorrhoids but not normally this bad. Pt also reports some bleeding and states it is painful to urinate as well.

## 2017-05-08 NOTE — Discharge Instructions (Addendum)
Use tylenol as needed for pain. Use anusol cream daily.  Follow up with the surgery group for evaluation.  Return to the ER if you develop persistent or heavy bleeding, or with any new or concerning symptoms.

## 2017-06-05 ENCOUNTER — Inpatient Hospital Stay (HOSPITAL_COMMUNITY): Payer: Managed Care, Other (non HMO)

## 2017-06-05 ENCOUNTER — Encounter (HOSPITAL_COMMUNITY): Payer: Self-pay

## 2017-06-05 ENCOUNTER — Inpatient Hospital Stay (HOSPITAL_COMMUNITY)
Admission: EM | Admit: 2017-06-05 | Discharge: 2017-06-07 | DRG: 062 | Disposition: A | Discharge status: Rehabilitation Facility | Payer: Managed Care, Other (non HMO) | Attending: Neurology | Admitting: Neurology

## 2017-06-05 ENCOUNTER — Emergency Department (HOSPITAL_COMMUNITY): Payer: Managed Care, Other (non HMO)

## 2017-06-05 DIAGNOSIS — R269 Unspecified abnormalities of gait and mobility: Secondary | ICD-10-CM | POA: Diagnosis not present

## 2017-06-05 DIAGNOSIS — E782 Mixed hyperlipidemia: Secondary | ICD-10-CM

## 2017-06-05 DIAGNOSIS — R29708 NIHSS score 8: Secondary | ICD-10-CM | POA: Diagnosis present

## 2017-06-05 DIAGNOSIS — E785 Hyperlipidemia, unspecified: Secondary | ICD-10-CM | POA: Diagnosis present

## 2017-06-05 DIAGNOSIS — G8114 Spastic hemiplegia affecting left nondominant side: Secondary | ICD-10-CM | POA: Diagnosis present

## 2017-06-05 DIAGNOSIS — I10 Essential (primary) hypertension: Secondary | ICD-10-CM | POA: Diagnosis present

## 2017-06-05 DIAGNOSIS — I69398 Other sequelae of cerebral infarction: Secondary | ICD-10-CM | POA: Diagnosis not present

## 2017-06-05 DIAGNOSIS — I351 Nonrheumatic aortic (valve) insufficiency: Secondary | ICD-10-CM | POA: Diagnosis not present

## 2017-06-05 DIAGNOSIS — I69354 Hemiplegia and hemiparesis following cerebral infarction affecting left non-dominant side: Secondary | ICD-10-CM | POA: Diagnosis not present

## 2017-06-05 DIAGNOSIS — I639 Cerebral infarction, unspecified: Secondary | ICD-10-CM | POA: Diagnosis present

## 2017-06-05 DIAGNOSIS — Z79899 Other long term (current) drug therapy: Secondary | ICD-10-CM

## 2017-06-05 DIAGNOSIS — N400 Enlarged prostate without lower urinary tract symptoms: Secondary | ICD-10-CM | POA: Diagnosis not present

## 2017-06-05 DIAGNOSIS — I6381 Other cerebral infarction due to occlusion or stenosis of small artery: Principal | ICD-10-CM | POA: Diagnosis present

## 2017-06-05 DIAGNOSIS — R0989 Other specified symptoms and signs involving the circulatory and respiratory systems: Secondary | ICD-10-CM | POA: Diagnosis not present

## 2017-06-05 DIAGNOSIS — I952 Hypotension due to drugs: Secondary | ICD-10-CM | POA: Diagnosis not present

## 2017-06-05 HISTORY — DX: Low back pain: M54.5

## 2017-06-05 HISTORY — DX: Low back pain, unspecified: M54.50

## 2017-06-05 HISTORY — DX: Pain in left leg: M79.605

## 2017-06-05 HISTORY — DX: Benign prostatic hyperplasia without lower urinary tract symptoms: N40.0

## 2017-06-05 LAB — LIPID PANEL
Cholesterol: 157 mg/dL (ref 0–200)
HDL: 31 mg/dL — ABNORMAL LOW (ref >40)
LDL Cholesterol: 87 mg/dL (ref 0–99)
Total CHOL/HDL Ratio: 5.1 RATIO
Triglycerides: 195 mg/dL — ABNORMAL HIGH (ref <150)
VLDL: 39 mg/dL (ref 0–40)

## 2017-06-05 LAB — DIFFERENTIAL
ABS IMMATURE GRANULOCYTES: 0 10*3/uL (ref 0.0–0.1)
BASOS ABS: 0.1 10*3/uL (ref 0.0–0.1)
Basophils Relative: 1 %
EOS ABS: 0.2 10*3/uL (ref 0.0–0.7)
Eosinophils Relative: 4 %
IMMATURE GRANULOCYTES: 1 %
LYMPHS PCT: 49 %
Lymphs Abs: 2.8 10*3/uL (ref 0.7–4.0)
Monocytes Absolute: 0.4 10*3/uL (ref 0.1–1.0)
Monocytes Relative: 8 %
Neutro Abs: 2 10*3/uL (ref 1.7–7.7)
Neutrophils Relative %: 37 %

## 2017-06-05 LAB — COMPREHENSIVE METABOLIC PANEL
ALBUMIN: 3.8 g/dL (ref 3.5–5.0)
ALT: 20 U/L (ref 17–63)
ANION GAP: 9 (ref 5–15)
AST: 22 U/L (ref 15–41)
Alkaline Phosphatase: 82 U/L (ref 38–126)
BUN: 9 mg/dL (ref 6–20)
CALCIUM: 9 mg/dL (ref 8.9–10.3)
CO2: 22 mmol/L (ref 22–32)
Chloride: 108 mmol/L (ref 101–111)
Creatinine, Ser: 0.87 mg/dL (ref 0.61–1.24)
GFR calc Af Amer: >60 mL/min (ref >60)
GFR calc non Af Amer: >60 mL/min (ref >60)
Glucose, Bld: 100 mg/dL — ABNORMAL HIGH (ref 65–99)
POTASSIUM: 3.8 mmol/L (ref 3.5–5.1)
SODIUM: 139 mmol/L (ref 135–145)
TOTAL PROTEIN: 7 g/dL (ref 6.5–8.1)
Total Bilirubin: 1.2 mg/dL (ref 0.3–1.2)

## 2017-06-05 LAB — ECHOCARDIOGRAM COMPLETE
Height: 68 in
WEIGHTICAEL: 2546.75 [oz_av]

## 2017-06-05 LAB — CBC
HEMATOCRIT: 42 % (ref 39.0–52.0)
Hemoglobin: 13.8 g/dL (ref 13.0–17.0)
MCH: 27.9 pg (ref 26.0–34.0)
MCHC: 32.9 g/dL (ref 30.0–36.0)
MCV: 85 fL (ref 78.0–100.0)
PLATELETS: 284 10*3/uL (ref 150–400)
RBC: 4.94 MIL/uL (ref 4.22–5.81)
RDW: 12.6 % (ref 11.5–15.5)
WBC: 5.6 10*3/uL (ref 4.0–10.5)

## 2017-06-05 LAB — HEMOGLOBIN A1C
Hgb A1c MFr Bld: 5.6 % (ref 4.8–5.6)
MEAN PLASMA GLUCOSE: 114.02 mg/dL

## 2017-06-05 LAB — I-STAT TROPONIN, ED: TROPONIN I, POC: 0 ng/mL (ref 0.00–0.08)

## 2017-06-05 LAB — RAPID URINE DRUG SCREEN, HOSP PERFORMED
Amphetamines: NOT DETECTED
Barbiturates: NOT DETECTED
Benzodiazepines: NOT DETECTED
Cocaine: NOT DETECTED
OPIATES: NOT DETECTED
Tetrahydrocannabinol: NOT DETECTED

## 2017-06-05 LAB — URINALYSIS, ROUTINE W REFLEX MICROSCOPIC
Bilirubin Urine: NEGATIVE
Glucose, UA: NEGATIVE mg/dL
Hgb urine dipstick: NEGATIVE
KETONES UR: NEGATIVE mg/dL
LEUKOCYTES UA: NEGATIVE
NITRITE: NEGATIVE
PH: 8 (ref 5.0–8.0)
Protein, ur: NEGATIVE mg/dL
SPECIFIC GRAVITY, URINE: 1.01 (ref 1.005–1.030)

## 2017-06-05 LAB — PROTIME-INR
INR: 1.04
Prothrombin Time: 13.5 seconds (ref 11.4–15.2)

## 2017-06-05 LAB — I-STAT CHEM 8, ED
BUN: 9 mg/dL (ref 6–20)
CALCIUM ION: 1.15 mmol/L (ref 1.15–1.40)
CHLORIDE: 105 mmol/L (ref 101–111)
CREATININE: 0.8 mg/dL (ref 0.61–1.24)
GLUCOSE: 99 mg/dL (ref 65–99)
HCT: 42 % (ref 39.0–52.0)
Hemoglobin: 14.3 g/dL (ref 13.0–17.0)
POTASSIUM: 3.7 mmol/L (ref 3.5–5.1)
Sodium: 141 mmol/L (ref 135–145)
TCO2: 25 mmol/L (ref 22–32)

## 2017-06-05 LAB — APTT: APTT: 30 s (ref 24–36)

## 2017-06-05 LAB — ETHANOL: Alcohol, Ethyl (B): <10 mg/dL (ref <10)

## 2017-06-05 LAB — CBG MONITORING, ED: Glucose-Capillary: 97 mg/dL (ref 65–99)

## 2017-06-05 LAB — MRSA PCR SCREENING: MRSA by PCR: NEGATIVE

## 2017-06-05 MED ORDER — LABETALOL HCL 5 MG/ML IV SOLN
10.0000 mg | INTRAVENOUS | Status: AC
  Administered 2017-06-05: 10 mg via INTRAVENOUS

## 2017-06-05 MED ORDER — IOPAMIDOL (ISOVUE-370) INJECTION 76%
50.0000 mL | Freq: Once | INTRAVENOUS | Status: AC | PRN
  Administered 2017-06-05: 50 mL via INTRAVENOUS

## 2017-06-05 MED ORDER — SENNOSIDES-DOCUSATE SODIUM 8.6-50 MG PO TABS: 1.0000 | ORAL_TABLET | Freq: Every evening | ORAL | Status: DC | PRN

## 2017-06-05 MED ORDER — STROKE: EARLY STAGES OF RECOVERY BOOK
Freq: Once | Status: DC
  Filled 2017-06-05 (×3): qty 1

## 2017-06-05 MED ORDER — LABETALOL HCL 5 MG/ML IV SOLN
INTRAVENOUS | Status: AC
  Administered 2017-06-05: 10 mg
  Filled 2017-06-05: qty 4

## 2017-06-05 MED ORDER — ACETAMINOPHEN 160 MG/5ML PO SOLN: 650.0000 mg | ORAL | Status: DC | PRN

## 2017-06-05 MED ORDER — SODIUM CHLORIDE 0.9 % IV SOLN
50.0000 mL | Freq: Once | INTRAVENOUS | Status: AC
  Administered 2017-06-05: 50 mL via INTRAVENOUS

## 2017-06-05 MED ORDER — ALTEPLASE (STROKE) FULL DOSE INFUSION
0.9000 mg/kg | Freq: Once | INTRAVENOUS | Status: AC
  Administered 2017-06-05: 65.3 mg via INTRAVENOUS

## 2017-06-05 MED ORDER — NICARDIPINE HCL IN NACL 20-0.86 MG/200ML-% IV SOLN
INTRAVENOUS | Status: AC
  Filled 2017-06-05: qty 200

## 2017-06-05 MED ORDER — SODIUM CHLORIDE 0.9 % IV SOLN
INTRAVENOUS | Status: DC
  Administered 2017-06-05 (×2): via INTRAVENOUS

## 2017-06-05 MED ORDER — ACETAMINOPHEN 325 MG PO TABS
650.0000 mg | ORAL_TABLET | ORAL | Status: DC | PRN
  Administered 2017-06-06: 650 mg via ORAL
  Filled 2017-06-05: qty 2

## 2017-06-05 MED ORDER — ATORVASTATIN CALCIUM 10 MG PO TABS
20.0000 mg | ORAL_TABLET | Freq: Every day | ORAL | Status: DC
  Administered 2017-06-05 – 2017-06-06 (×2): 20 mg via ORAL
  Filled 2017-06-05 (×2): qty 1

## 2017-06-05 MED ORDER — ACETAMINOPHEN 650 MG RE SUPP: 650.0000 mg | RECTAL | Status: DC | PRN

## 2017-06-05 MED ORDER — NICARDIPINE HCL IN NACL 20-0.86 MG/200ML-% IV SOLN: 3.0000 mg/h | INTRAVENOUS | Status: DC

## 2017-06-05 NOTE — Progress Notes (Signed)
OT Cancellation Note  Patient Details Name: Robert Sandoval MRN: [id number] DOB: 1961/01/02   Cancelled Treatment:    Reason Eval/Treat Not Completed: Medical issues which prohibited therapy.  Pt had not been cleared for OOB after TPA upon attempt this am.  Was unable to reattempt this pm.  Will try back.   [place], OTR/L [phone]   Lucille Passy M 06/05/2017, 2:53 PM

## 2017-06-05 NOTE — H&P (Signed)
STROKE H&P  Reason for Consult: Code stroke Referring Physician: Dr. Waverly Ferrari  CC: Left-sided weakness  History is obtained from: Patient, chart  HPI: Robert Sandoval is a 57 y.o. male past medical history of hypertension, last known well and midnight when he went to bed, and woke up to realize that he cannot move his left arm or leg. He says that he was in his usual state of health all day, did not notice anything different, went to bed as he does around midnight and woke up realizing that his arm and leg had gone to sleep. He tried to get out of bed but was unable to. EMS was called.  He was brought in as an acute code stroke for left-sided weakness.  His only blood pressure was in 190s on initial assessment. Upon initial assessment, his initial NIH stroke scale was 8.  He had complete flaccidity of the left upper extremity, his left leg-he could wiggle his toes but could not get the leg off the bed. He was taken for a noncontrast CT of the head, that did not show any intracranial abnormality.  No bleed. IV TPA was administered after reviewing contraindications.   LKW: 0001 on 06/05/2017 tpa given?:  Yes Premorbid modified Rankin scale (mRS): 0  ROS: ROS was performed and is negative except as noted in the HPI.    Past Medical History:  Diagnosis Date  . Allergy   . Hyperlipidemia   . Hypertension    Family History  Problem Relation Age of Onset  . Hyperlipidemia Father   . Asthma Father    Social History:   reports that he has never smoked. He has never used smokeless tobacco. He reports that he drinks alcohol. He reports that he does not use drugs.  Medications  Current Facility-Administered Medications:  .   stroke: mapping our early stages of recovery book, , Does not apply, Once, Amie Portland, MD .  0.9 %  sodium chloride infusion, , Intravenous, Continuous, Amie Portland, MD .  alteplase (ACTIVASE) 1 mg/mL infusion 65.3 mg, 0.9 mg/kg, Intravenous, Once, Last Rate: 65.3  mL/hr at 06/05/17 0256, 65.3 mg at 06/05/17 0256 **FOLLOWED BY** 0.9 %  sodium chloride infusion, 50 mL, Intravenous, Once, Amie Portland, MD .  acetaminophen (TYLENOL) tablet 650 mg, 650 mg, Oral, Q4H PRN **OR** acetaminophen (TYLENOL) solution 650 mg, 650 mg, Per Tube, Q4H PRN **OR** acetaminophen (TYLENOL) suppository 650 mg, 650 mg, Rectal, Q4H PRN, Amie Portland, MD .  labetalol (NORMODYNE,TRANDATE) 5 MG/ML injection, , , ,  .  nicardipine (CARDENE) 52m in 0.86% saline 2013mIV infusion (0.1 mg/ml), 3-15 mg/hr, Intravenous, Continuous, ArAmie PortlandMD .  niCARdipine in saline (CARDENE-IV) 20-0.86 MG/200ML-% infusion SOLN, , , ,  .  senna-docusate (Senokot-S) tablet 1 tablet, 1 tablet, Oral, QHS PRN, ArAmie PortlandMD  Current Outpatient Medications:  .  gabapentin (NEURONTIN) 300 MG capsule, Take 300 mg by mouth 3 (three) times daily as needed (for pain). , Disp: , Rfl:  .  lisinopril (PRINIVIL,ZESTRIL) 20 MG tablet, Take 1 tablet (20 mg total) by mouth daily., Disp: 90 tablet, Rfl: 3 .  tamsulosin (FLOMAX) 0.4 MG CAPS capsule, Take 0.4 mg by mouth daily., Disp: , Rfl:  .  hydrocortisone (ANUSOL-HC) 2.5 % rectal cream, Place 1 application rectally at bedtime. (Patient not taking: Reported on 06/05/2017), Disp: 30 g, Rfl: 0 .  polyethylene glycol powder (GLYCOLAX/MIRALAX) powder, Take 17 g by mouth daily. (Patient not taking: Reported on 06/05/2017), Disp: 3350 g, Rfl:  1  Exam: Current vital signs: BP (!) 162/100 (BP Location: Right Arm)   Pulse 69   Temp 98.2 F (36.8 C) (Oral)   Resp 16   Ht 5' 8" (1.727 m)   Wt 72.6 kg (160 lb)   SpO2 100%   BMI 24.33 kg/m  Vital signs in last 24 hours: Temp:  [98.2 F (36.8 C)] 98.2 F (36.8 C) (05/21 0246) Pulse Rate:  [69-82] 69 (05/21 0248) Resp:  [16] 16 (05/21 0248) BP: (162-176)/(100-116) 162/100 (05/21 0253) SpO2:  [100 %] 100 % (05/21 0248) Weight:  [72.6 kg (160 lb)] 72.6 kg (160 lb) (05/21 0252)  GENERAL: Awake, alert in  NAD HEENT: - Normocephalic and atraumatic, dry mm, no LN++, no Thyromegally LUNGS - Clear to auscultation bilaterally with no wheezes CV - S1S2 RRR, no m/r/g, equal pulses bilaterally. ABDOMEN - Soft, nontender, nondistended with normoactive BS Ext: warm, well perfused, intact peripheral pulses, -ve for edema  NEURO:  Mental Status: AA&Ox3  Language: speech is clear.  Naming, repetition, fluency, and comprehension intact. Cranial Nerves: PERRL. EOMI, visual fields full, no facial asymmetry, facial sensation intact, hearing intact, tongue/uvula/soft palate midline, normal sternocleidomastoid and trapezius muscle strength. No evidence of tongue atrophy or fibrillations Motor: Left upper extremity 0/5, left lower extremity 1/5, right upper and lower extremity 5/5. Tone: is normal and bulk is normal Sensation-decreased sensation to light touch on the left hemibody, no extinction Coordination: FTN intact on the right, unable to perform in the left Gait- deferred  NIHSS-8   Labs I have reviewed labs in epic and the results pertinent to this consultation are:  CBC    Component Value Date/Time   WBC 5.6 06/05/2017 0236   RBC 4.94 06/05/2017 0236   HGB 14.3 06/05/2017 0249   HCT 42.0 06/05/2017 0249   PLT 284 06/05/2017 0236   MCV 85.0 06/05/2017 0236   MCV 88.0 07/06/2014 1324   MCH 27.9 06/05/2017 0236   MCHC 32.9 06/05/2017 0236   RDW 12.6 06/05/2017 0236   LYMPHSABS 2.8 06/05/2017 0236   MONOABS 0.4 06/05/2017 0236   EOSABS 0.2 06/05/2017 0236   BASOSABS 0.1 06/05/2017 0236    CMP     Component Value Date/Time   NA 141 06/05/2017 0249   NA 140 08/05/2016 1531   K 3.7 06/05/2017 0249   CL 105 06/05/2017 0249   CO2 20 08/05/2016 1531   GLUCOSE 99 06/05/2017 0249   BUN 9 06/05/2017 0249   BUN 8 08/05/2016 1531   CREATININE 0.80 06/05/2017 0249   CREATININE 0.78 07/13/2015 1104   CALCIUM 9.3 08/05/2016 1531   PROT 7.3 08/05/2016 1531   ALBUMIN 4.4 08/05/2016 1531    AST 25 08/05/2016 1531   ALT 26 08/05/2016 1531   ALKPHOS 84 08/05/2016 1531   BILITOT 0.8 08/05/2016 1531   GFRNONAA 105 08/05/2016 1531   GFRNONAA >89 07/13/2015 1104   GFRAA 121 08/05/2016 1531   GFRAA >89 07/13/2015 1104   Imaging I have reviewed the images obtained:  CT-scan of the brain-aspects 10.  No bleed. CT angios head and neck-no emergent large vessel occlusion  Assessment:  This 57 year old man with past medical history of hypertension, last known well at midnight, presented to the emergency room at 2:35 AM with left-sided hemiplegia with no facial involvement. Exam consistent with lacunar stroke-small vessel etiology. Noncontrast CT of the head showed no bleed. No contraindication to IV TPA. IV TPA administered, under 30 minutes but with slight delay as documented below.  Patient continued to do well after TPA administration.  His leg exam was better and was able to raise his leg above the bed after the TPA demonstration.  Impression: Acute ischemic stroke- likely small vessel etiology Post TPA care  Plan: Admit to neuro ICU Frequent neurochecks 2D echocardiogram MRI brain without contrast 24 hours Resume aspirin after 24 hours if no bleed No antiplatelets or anticoagulant for 24 hours- use SCDs for DVT prophylaxis.  CNS -Close neuro monitoring -MRI as above -TPA protocol neurochecks -Stat CT head if there is neurological deterioration -PT OT speech therapy  CVS -Strict blood pressure management with a goal less than 180/105 -Use Cardene and labetalol -2D echo -Telemetry -Check lipid panel  Respiratory -No acute issues -Maintain sats over 95% -Obtain chest x-ray to rule out any aspiration  GI/GU -No active issues -Gentle hydration-normal saline 75 cc an hour  Hematology -No active issues -Monitor CBC  Endocrine -Check hemoglobin A1c -Goal blood sugar level during admission less than 180  DVT prophylaxis- SCD.  Do not use antiplatelets or  anticoagulant for 24 hours after TPA administration. GI prophylaxis-not indicated Bowel: Docusate senna   Delays in administering IV TPA.: 1. diastolic was consistently out of range.  Required 2 doses of IV labetalol.   2. the TPA bottle broke while mixing TPA and another TPA kit had to be obtained.  The second kit was brought in at least after a 8 to 10-minute delay.  Present on admission -Essential hypertension -Hemiplegia left side   I explained the philosophy of care to the patient, who is fluent in [place].  His wife, whose been speaking only, was also relayed the plan via  Video interpreter.  -- Amie Portland, MD [facility] Pager: [phone] If 7pm to 7am, please call on call as listed on AMION.  CRITICAL CARE ATTESTATION This patient is critically ill and at significant risk of neurological worsening, death and care requires constant monitoring of vital signs, hemodynamics,respiratory and cardiac monitoring. I spent 60  minutes of neurocritical care time performing neurological assessment, discussion with family, other specialists and medical decision making of high complexityin the care of  this patient.

## 2017-06-05 NOTE — Progress Notes (Signed)
STROKE TEAM PROGRESS NOTE   SUBJECTIVE (INTERVAL HISTORY) His RN is at the bedside.  Overall he feels his condition is gradually improving. He is able to lift LUE and LLE against gravity now. Still has mild numbness left facial and left UE and LE. MRI pending tonight. Denies any neck pain, arm pain, shoulder pain or heart palpitations.    OBJECTIVE Temp:  [98.2 F (36.8 C)-98.4 F (36.9 C)] 98.4 F (36.9 C) (05/21 1200) Pulse Rate:  [58-82] 65 (05/21 1200) Cardiac Rhythm: Normal sinus rhythm (05/21 0315) Resp:  [11-22] 13 (05/21 1200) BP: (109-176)/(85-116) 122/89 (05/21 1200) SpO2:  [98 %-100 %] 100 % (05/21 1200) Weight:  [159 lb 2.8 oz (72.2 kg)-160 lb (72.6 kg)] 159 lb 2.8 oz (72.2 kg) (05/21 0456)  Recent Labs  Lab 06/05/17 0238  GLUCAP 97   Recent Labs  Lab 06/05/17 0236 06/05/17 0249  NA 139 141  K 3.8 3.7  CL 108 105  CO2 22  --   GLUCOSE 100* 99  BUN 9 9  CREATININE 0.87 0.80  CALCIUM 9.0  --    Recent Labs  Lab 06/05/17 0236  AST 22  ALT 20  ALKPHOS 82  BILITOT 1.2  PROT 7.0  ALBUMIN 3.8   Recent Labs  Lab 06/05/17 0236 06/05/17 0249  WBC 5.6  --   NEUTROABS 2.0  --   HGB 13.8 14.3  HCT 42.0 42.0  MCV 85.0  --   PLT 284  --    No results for input(s): CKTOTAL, CKMB, CKMBINDEX, TROPONINI in the last 168 hours. Recent Labs    06/05/17 0236  LABPROT 13.5  INR 1.04   Recent Labs    06/05/17 0359  COLORURINE COLORLESS*  LABSPEC 1.010  PHURINE 8.0  GLUCOSEU NEGATIVE  HGBUR NEGATIVE  BILIRUBINUR NEGATIVE  KETONESUR NEGATIVE  PROTEINUR NEGATIVE  NITRITE NEGATIVE  LEUKOCYTESUR NEGATIVE       Component Value Date/Time   CHOL 157 06/05/2017 0401   CHOL 172 08/05/2016 1531   TRIG 195 (H) 06/05/2017 0401   HDL 31 (L) 06/05/2017 0401   HDL 30 (L) 08/05/2016 1531   CHOLHDL 5.1 06/05/2017 0401   VLDL 39 06/05/2017 0401   LDLCALC 87 06/05/2017 0401   LDLCALC 76 08/05/2016 1531   Lab Results  Component Value Date   HGBA1C 5.6  06/05/2017      Component Value Date/Time   LABOPIA NONE DETECTED 06/05/2017 0359   COCAINSCRNUR NONE DETECTED 06/05/2017 0359   LABBENZ NONE DETECTED 06/05/2017 0359   AMPHETMU NONE DETECTED 06/05/2017 0359   THCU NONE DETECTED 06/05/2017 0359   LABBARB NONE DETECTED 06/05/2017 0359    Recent Labs  Lab 06/05/17 0236  ETH <10    I have personally reviewed the radiological images below and agree with the radiology interpretations.  Ct Angio Head W Or Wo Contrast  Result Date: 06/05/2017 CLINICAL DATA:  Initial evaluation for acute left-sided weakness. EXAM: CT ANGIOGRAPHY HEAD AND NECK TECHNIQUE: Multidetector CT imaging of the head and neck was performed using the standard protocol during bolus administration of intravenous contrast. Multiplanar CT image reconstructions and MIPs were obtained to evaluate the vascular anatomy. Carotid stenosis measurements (when applicable) are obtained utilizing NASCET criteria, using the distal internal carotid diameter as the denominator. CONTRAST:  17mL ISOVUE-370 IOPAMIDOL (ISOVUE-370) INJECTION 76% COMPARISON:  Prior CT performed earlier the same day. FINDINGS: CTA NECK FINDINGS Aortic arch: Visualized aortic arch of normal caliber with normal branch pattern. No flow-limiting stenosis about the  origin of the great vessels. Visualized subclavian arteries widely patent. Right carotid system: Right common and internal carotid arteries widely patent without stenosis, dissection, or occlusion. No atheromatous narrowing about the right carotid bifurcation. Left carotid system: Left common and internal carotid arteries widely patent without stenosis, dissection, or occlusion. No atheromatous narrowing about the left carotid bifurcation. Vertebral arteries: Left vertebral artery arises separately from the aortic arch. Vertebral arteries widely patent within the neck without stenosis, dissection, or occlusion. Skeleton: No acute osseus abnormality. No worrisome  lytic or blastic osseous lesions. Mild degenerative disc bulge at C6-7 without significant stenosis. Other neck: No acute soft tissue abnormality within the neck. Salivary glands normal. Thyroid normal. No adenopathy. Upper chest: Visualized upper chest within normal limits. Partially visualized lungs are clear. Review of the MIP images confirms the above findings CTA HEAD FINDINGS Anterior circulation: Internal carotid arteries widely patent to the termini without stenosis. Right A1 patent. Absent/hypoplastic left A1, accounting for the diminutive left ICA is compared to the right. Normal anterior communicating artery. Anterior cerebral arteries widely patent to their distal aspects without stenosis. No M1 stenosis or occlusion. No proximal M2 occlusion. Distal MCA branches well perfused and symmetric. Posterior circulation: Vertebral arteries widely patent to the vertebrobasilar junction without stenosis. Posterior inferior cerebral arteries patent bilaterally. Basilar artery widely patent to its distal aspect. Superior cerebral arteries patent bilaterally. Both of the posterior cerebral artery supplied via the basilar and are well perfused to their distal aspects without stenosis. Venous sinuses: Patent. Anatomic variants: Hypoplastic/absent left A1 with the anterior cerebral artery supplied via the right carotid artery system. No aneurysm. Delayed phase: Not performed. Review of the MIP images confirms the above findings IMPRESSION: Normal CTA of the head and neck with no evidence for emergent large vessel occlusion. No significant atheromatous disease for patient age. No hemodynamically significant or correctable stenosis. These results were communicated to Rory Percy at 3:28 amon 5/21/2019by text page via the [facility] messaging system. Electronically Signed   By: Jeannine Boga M.D.   On: 06/05/2017 03:43   Ct Angio Neck W Or Wo Contrast  Result Date: 06/05/2017 CLINICAL DATA:  Initial evaluation for acute  left-sided weakness. EXAM: CT ANGIOGRAPHY HEAD AND NECK TECHNIQUE: Multidetector CT imaging of the head and neck was performed using the standard protocol during bolus administration of intravenous contrast. Multiplanar CT image reconstructions and MIPs were obtained to evaluate the vascular anatomy. Carotid stenosis measurements (when applicable) are obtained utilizing NASCET criteria, using the distal internal carotid diameter as the denominator. CONTRAST:  66mL ISOVUE-370 IOPAMIDOL (ISOVUE-370) INJECTION 76% COMPARISON:  Prior CT performed earlier the same day. FINDINGS: CTA NECK FINDINGS Aortic arch: Visualized aortic arch of normal caliber with normal branch pattern. No flow-limiting stenosis about the origin of the great vessels. Visualized subclavian arteries widely patent. Right carotid system: Right common and internal carotid arteries widely patent without stenosis, dissection, or occlusion. No atheromatous narrowing about the right carotid bifurcation. Left carotid system: Left common and internal carotid arteries widely patent without stenosis, dissection, or occlusion. No atheromatous narrowing about the left carotid bifurcation. Vertebral arteries: Left vertebral artery arises separately from the aortic arch. Vertebral arteries widely patent within the neck without stenosis, dissection, or occlusion. Skeleton: No acute osseus abnormality. No worrisome lytic or blastic osseous lesions. Mild degenerative disc bulge at C6-7 without significant stenosis. Other neck: No acute soft tissue abnormality within the neck. Salivary glands normal. Thyroid normal. No adenopathy. Upper chest: Visualized upper chest within normal limits. Partially visualized  lungs are clear. Review of the MIP images confirms the above findings CTA HEAD FINDINGS Anterior circulation: Internal carotid arteries widely patent to the termini without stenosis. Right A1 patent. Absent/hypoplastic left A1, accounting for the diminutive left  ICA is compared to the right. Normal anterior communicating artery. Anterior cerebral arteries widely patent to their distal aspects without stenosis. No M1 stenosis or occlusion. No proximal M2 occlusion. Distal MCA branches well perfused and symmetric. Posterior circulation: Vertebral arteries widely patent to the vertebrobasilar junction without stenosis. Posterior inferior cerebral arteries patent bilaterally. Basilar artery widely patent to its distal aspect. Superior cerebral arteries patent bilaterally. Both of the posterior cerebral artery supplied via the basilar and are well perfused to their distal aspects without stenosis. Venous sinuses: Patent. Anatomic variants: Hypoplastic/absent left A1 with the anterior cerebral artery supplied via the right carotid artery system. No aneurysm. Delayed phase: Not performed. Review of the MIP images confirms the above findings IMPRESSION: Normal CTA of the head and neck with no evidence for emergent large vessel occlusion. No significant atheromatous disease for patient age. No hemodynamically significant or correctable stenosis. These results were communicated to Rory Percy at 3:28 amon 5/21/2019by text page via the [facility] messaging system. Electronically Signed   By: Jeannine Boga M.D.   On: 06/05/2017 03:43   Dg Chest Port 1 View  Result Date: 06/05/2017 CLINICAL DATA:  Stroke.  Left-sided weakness. EXAM: PORTABLE CHEST 1 VIEW COMPARISON:  None. FINDINGS: Mild cardiomegaly. Mild aortic tortuosity, otherwise normal mediastinal contours. No pulmonary edema. No focal airspace disease, pleural effusion or pneumothorax. No acute osseous abnormalities. IMPRESSION: Mild cardiomegaly and tortuous thoracic aorta. No acute chest process. Electronically Signed   By: Jeb Levering M.D.   On: 06/05/2017 03:33   Ct Head Code Stroke Wo Contrast  Result Date: 06/05/2017 CLINICAL DATA:  Code stroke. Initial evaluation for acute left-sided weakness. EXAM: CT HEAD  WITHOUT CONTRAST TECHNIQUE: Contiguous axial images were obtained from the base of the skull through the vertex without intravenous contrast. COMPARISON:  None. FINDINGS: Brain: Cerebral volume within normal limits. No acute intracranial hemorrhage. No acute large vessel territory infarct. No mass lesion, midline shift or mass effect. No hydrocephalus. No extra-axial fluid collection. Vascular: No asymmetric hyperdense vessel. Skull: Scalp soft tissues and calvarium within normal limits. Sinuses/Orbits: Globes and orbital soft tissues within normal limits. Scattered mucosal thickening throughout the ethmoidal air cells and maxillary sinuses with probable mucocele at the left ethmoidal air cells. Mastoid air cells are clear. Other: None. ASPECTS [facility] Stroke Program Early CT Score) - Ganglionic level infarction (caudate, lentiform nuclei, internal capsule, insula, M1-M3 cortex): 7 - Supraganglionic infarction (M4-M6 cortex): 3 Total score (0-10 with 10 being normal): 10 IMPRESSION: 1. No acute intracranial infarct or other abnormality identified. 2. ASPECTS is 10. These results were communicated to Rory Percy at 3:10 amon 5/21/2019by text page via the [facility] messaging system. Electronically Signed   By: Jeannine Boga M.D.   On: 06/05/2017 03:12   MRI pending  TTE pending   PHYSICAL EXAM  Temp:  [98.2 F (36.8 C)-98.4 F (36.9 C)] 98.4 F (36.9 C) (05/21 1200) Pulse Rate:  [58-82] 65 (05/21 1200) Resp:  [11-22] 13 (05/21 1200) BP: (109-176)/(85-116) 122/89 (05/21 1200) SpO2:  [98 %-100 %] 100 % (05/21 1200) Weight:  [159 lb 2.8 oz (72.2 kg)-160 lb (72.6 kg)] 159 lb 2.8 oz (72.2 kg) (05/21 0456)  General - Well nourished, well developed, in no apparent distress.  Ophthalmologic - fundi not visualized due to  noncooperation.  Cardiovascular - Regular rate and rhythm.  Mental Status -  Level of arousal and orientation to time, place, and person were intact. Language including expression,  naming, repetition, comprehension was assessed and found intact. Attention span and concentration were normal. Fund of Knowledge was assessed and was intact.  Cranial Nerves II - XII - II - Visual field intact OU. III, IV, VI - Extraocular movements intact. V - Facial sensation decreased on the left, 90% of the right. VII - Facial movement intact bilaterally. VIII - Hearing & vestibular intact bilaterally. X - Palate elevates symmetrically. XI - Chin turning & shoulder shrug intact bilaterally. XII - Tongue protrusion intact.  Motor Strength - The patient's strength was normal in RUE and RLE, but left UE 3/5 proximal, 4/5 distal, left LE proximal 3-/5 and distal 3/5.  Bulk was normal and fasciculations were absent.   Motor Tone - Muscle tone was assessed at the neck and appendages and was normal.  Reflexes - The patient's reflexes were symmetrical in all extremities and he had no pathological reflexes.  Sensory - Light touch, temperature/pinprick were assessed and were decreased on the left, 80-90% of the right.    Coordination - The patient had normal movements in the right hand and foot with no ataxia or dysmetria.  Tremor was absent.  Gait and Station - not tested due to weakness.   ASSESSMENT/PLAN Mr. Josten Warmuth is a 57 y.o. male with history of HTN and HLD admitted for left arm and leg weakness. TPA given.    Stroke: likely right subcortical infarct, may be small vessel disease source   Resultant left hemiparesis   MRI  pending  CTA head and neck unremarkable  2D Echo  pending  LDL 87  HgbA1c 5.6  UDS neg  SCDs for VTE prophylaxis  No antithrombotic prior to admission, now on No antithrombotic within tPA 24h.  Ongoing aggressive stroke risk factor management  Therapy recommendations:  Pending   Disposition:  pending  Hypertension Stable Permissive hypertension (OK if <180/105) for 24-48 hours post stroke and then gradually normalized within 5-7  days.  Long term BP goal normotensive  Hyperlipidemia  Home meds:  none   LDL 87, goal < 70  Now on lipitor 20  Continue statin at discharge  Other Stroke Risk Factors    Other Active Problems  Elevated TG  Hospital day # 0  This patient is critically ill due to acute stroke s/p tPA and at significant risk of neurological worsening, death form recurrent stroke, hemorrhagic conversion. This patient's care requires constant monitoring of vital signs, hemodynamics, respiratory and cardiac monitoring, review of multiple databases, neurological assessment, discussion with family, other specialists and medical decision making of high complexity. I spent 30 minutes of neurocritical care time in the care of this patient.   Rosalin Hawking, MD PhD Stroke Neurology 06/05/2017 12:31 PM    To contact Stroke Continuity provider, please refer to [email or web address] After hours, contact General Neurology

## 2017-06-05 NOTE — ED Notes (Signed)
Report given to [facility] in Neuro ICU

## 2017-06-05 NOTE — Evaluation (Signed)
Physical Therapy Evaluation Patient Details Name: Robert Sandoval MRN: [id number] DOB: 1960-03-02 Today's Date: 06/05/2017   History of Present Illness   Robert Sandoval is a 57 y.o. male past medical history of hypertension admitted as an acute code stroke for left-sided weakness.  Underwent TPA administration.   Clinical Impression  Patient presents with decreased independence with mobility due to L LE weakness, decreased sensation, decreased balance, decreased safety awareness and limited activity tolerance.  He will benefit from skilled PT in the acute setting to allow return home with family support following CIR level rehab stay.  Currently mod A +2 for mobility bed to chair and previously independent and working.  PT to follow acutely.     Follow Up Recommendations CIR    Equipment Recommendations  Other (comment)(TBA)    Recommendations for Other Services Rehab consult     Precautions / Restrictions Precautions Precautions: Fall Precaution Comments: BP< 180/105      Mobility  Bed Mobility Overal bed mobility: Needs Assistance Bed Mobility: Supine to Sit     Supine to sit: HOB elevated;Mod assist     General bed mobility comments: assist for L leg and balance with trunk  Transfers Overall transfer level: Needs assistance Equipment used: 2 person hand held assist Transfers: Sit to/from Stand;Stand Pivot Transfers Sit to Stand: Min assist;+2 safety/equipment Stand pivot transfers: Mod assist;+2 safety/equipment       General transfer comment: stood initially unaided, but off balance with L LE weakness, stood with +2 A for safety and lines, stepping L forward first, then L LE buckling assist for stance phase stability with stepping to chair and cues for sequencing bilat UE support  Ambulation/Gait                Stairs            Wheelchair Mobility    Modified Rankin (Stroke Patients Only) Modified Rankin (Stroke Patients Only) Pre-Morbid Rankin  Score: No symptoms Modified Rankin: Moderately severe disability     Balance Overall balance assessment: Needs assistance   Sitting balance-Leahy Scale: Fair     Standing balance support: Bilateral upper extremity supported Standing balance-Leahy Scale: Poor Standing balance comment: UE support for balance                             Pertinent Vitals/Pain Pain Assessment: No/denies pain    Home Living Family/patient expects to be discharged to:: Private residence Living Arrangements: Spouse/significant other Available Help at Discharge: Family Type of Home: Apartment Home Access: Stairs to enter Entrance Stairs-Rails: Left Entrance Stairs-Number of Steps: 2 Home Layout: One level Home Equipment: None      Prior Function Level of Independence: Independent         Comments: 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, newspaper   Dominant Hand: Right    Extremity/Trunk Assessment   Upper Extremity Assessment Upper Extremity Assessment: LUE deficits/detail LUE Deficits / Details: AAROM WFL, strength grossly 3-/5 shoulder flexion, 3+/5 elbow flexion, and grip LUE Sensation: decreased light touch    Lower Extremity Assessment Lower Extremity Assessment: LLE deficits/detail LLE Deficits / Details: AAROM WFL, strength hip flexion 2+/5, knee extension 2+/5, ankle DF 3=/5 LLE Sensation: decreased light touch(proprioception NT)       Communication   Communication: No difficulties(primary Spanish speaker, but proficient in [place])  Cognition Arousal/Alertness: Awake/alert Behavior During Therapy: WFL for tasks assessed/performed Overall Cognitive  Status: Within Functional Limits for tasks assessed                                        General Comments General comments (skin integrity, edema, etc.): wife in the room and on the phone    Exercises     Assessment/Plan    PT Assessment Patient needs continued PT  services  PT Problem List Decreased balance;Decreased knowledge of use of DME;Decreased knowledge of precautions;Decreased coordination;Impaired sensation;Decreased strength;Decreased mobility;Decreased safety awareness       PT Treatment Interventions DME instruction;Functional mobility training;Balance training;Patient/family education;Gait training;Therapeutic activities;Neuromuscular re-education;Therapeutic exercise;Stair training    PT Goals (Current goals can be found in the Care Plan section)  Acute Rehab PT Goals Patient Stated Goal: to return to independent PT Goal Formulation: With patient Time For Goal Achievement: 06/19/17 Potential to Achieve Goals: Good    Frequency Min 4X/week   Barriers to discharge        Co-evaluation               AM-PAC PT "6 Clicks" Daily Activity  Outcome Measure Difficulty turning over in bed (including adjusting bedclothes, sheets and blankets)?: A Lot Difficulty moving from lying on back to sitting on the side of the bed? : Unable Difficulty sitting down on and standing up from a chair with arms (e.g., wheelchair, bedside commode, etc,.)?: Unable Help needed moving to and from a bed to chair (including a wheelchair)?: A Little Help needed walking in hospital room?: A Little Help needed climbing 3-5 steps with a railing? : Total 6 Click Score: 11    End of Session Equipment Utilized During Treatment: Gait belt Activity Tolerance: Patient tolerated treatment well Patient left: with call bell/phone within reach;in chair;with chair alarm set;with family/visitor present   PT Visit Diagnosis: Other abnormalities of gait and mobility (R26.89);Other symptoms and signs involving the nervous system (R29.898);Hemiplegia and hemiparesis Hemiplegia - Right/Left: Left Hemiplegia - dominant/non-dominant: Non-dominant Hemiplegia - caused by: Cerebral infarction    Time: 8110-3159 PT Time Calculation (min) (ACUTE ONLY): 22 min   Charges:    PT Evaluation $PT Eval Moderate Complexity: 1 Mod     PT G CodesMagda Kiel, [place] [phone] 06/05/2017   Reginia Naas 06/05/2017, 5:02 PM

## 2017-06-05 NOTE — ED Provider Notes (Addendum)
[facility] EMERGENCY DEPARTMENT Provider Note   CSN: [id number] Arrival date & time: 06/05/17  4166     History   Chief Complaint Chief Complaint  Patient presents with  . Code Stroke    HPI Robert Sandoval is a 57 y.o. male.  Patient presents to the emergency department for evaluation of acute onset left-sided weakness.  Patient went to bed at midnight in his normal state of health.  He woke up at 2 AM unable to move his left arm and leg. Patient arrives via EMS as a code stroke, seen alongside Neurology at arrival.     Past Medical History:  Diagnosis Date  . Allergy   . Hyperlipidemia   . Hypertension     Patient Active Problem List   Diagnosis Date Noted  . Acute ischemic stroke ([place]) 06/05/2017  . Prostate nodule 10/30/2016  . Hypertension 08/05/2016  . External hemorrhoids without complication 07/16/1599  . Melanosis of colon 11/23/2015  . Diverticulosis of colon without hemorrhage 11/23/2015  . Internal hemorrhoids 11/23/2015  . Essential hypertension, benign 09/14/2014    History reviewed. No pertinent surgical history.      Home Medications    Prior to Admission medications   Medication Sig Start Date End Date Taking? Authorizing Provider  gabapentin (NEURONTIN) 300 MG capsule Take 300 mg by mouth 3 (three) times daily as needed (for pain).    Yes [provider]  lisinopril (PRINIVIL,ZESTRIL) 20 MG tablet Take 1 tablet (20 mg total) by mouth daily. 08/05/16  Yes Posey Boyer, MD  tamsulosin (FLOMAX) 0.4 MG CAPS capsule Take 0.4 mg by mouth daily.   Yes [provider]  hydrocortisone (ANUSOL-HC) 2.5 % rectal cream Place 1 application rectally at bedtime. Patient not taking: Reported on 06/05/2017 05/08/17   Caccavale, Sophia, PA-C  polyethylene glycol powder (GLYCOLAX/MIRALAX) powder Take 17 g by mouth daily. Patient not taking: Reported on 06/05/2017 09/14/14   Roselee Culver, MD    Family History Family  History  Problem Relation Age of Onset  . Hyperlipidemia Father   . Asthma Father     Social History Social History   Tobacco Use  . Smoking status: Never Smoker  . Smokeless tobacco: Never Used  Substance Use Topics  . Alcohol use: Yes  . Drug use: No     Allergies   Patient has no known allergies.   Review of Systems Review of Systems  Neurological: Positive for weakness and numbness.  All other systems reviewed and are negative.    Physical Exam Updated Vital Signs BP (!) 137/98   Pulse 64   Temp 98.2 F (36.8 C) (Oral)   Resp 15   Ht 5\' 8"  (1.727 m)   Wt 72.6 kg (160 lb)   SpO2 100%   BMI 24.33 kg/m   Physical Exam  Constitutional: He is oriented to person, place, and time. He appears well-developed and well-nourished. No distress.  HENT:  Head: Normocephalic and atraumatic.  Right Ear: Hearing normal.  Left Ear: Hearing normal.  Nose: Nose normal.  Mouth/Throat: Oropharynx is clear and moist and mucous membranes are normal.  Eyes: Pupils are equal, round, and reactive to light. Conjunctivae and EOM are normal.  Neck: Normal range of motion. Neck supple.  Cardiovascular: Regular rhythm, S1 normal and S2 normal. Exam reveals no gallop and no friction rub.  No murmur heard. Pulmonary/Chest: Effort normal and breath sounds normal. No respiratory distress. He exhibits no tenderness.  Abdominal: Soft. Normal appearance  and bowel sounds are normal. There is no hepatosplenomegaly. There is no tenderness. There is no rebound, no guarding, no tenderness at McBurney's point and negative Murphy's sign. No hernia.  Musculoskeletal: Normal range of motion.  Neurological: He is alert and oriented to person, place, and time. A sensory deficit is present. No cranial nerve deficit. He exhibits abnormal muscle tone. Coordination normal. GCS eye subscore is 4. GCS verbal subscore is 5. GCS motor subscore is 6.  Left flaccid hemiparesis  Skin: Skin is warm, dry and intact.  No rash noted. No cyanosis.  Psychiatric: He has a normal mood and affect. His speech is normal and behavior is normal. Thought content normal.  Nursing note and vitals reviewed.    ED Treatments / Results  Labs (all labs ordered are listed, but only abnormal results are displayed) Labs Reviewed  COMPREHENSIVE METABOLIC PANEL - Abnormal; Notable for the following components:      Result Value   Glucose, Bld 100 (*)    All other components within normal limits  ETHANOL  PROTIME-INR  APTT  CBC  DIFFERENTIAL  RAPID URINE DRUG SCREEN, HOSP PERFORMED  URINALYSIS, ROUTINE W REFLEX MICROSCOPIC  HEMOGLOBIN A1C  LIPID PANEL  I-STAT CHEM 8, ED  I-STAT TROPONIN, ED  CBG MONITORING, ED    EKG EKG Interpretation  Date/Time:  Tuesday Jun 05 2017 02:59:26 EDT Ventricular Rate:  63 PR Interval:  180 QRS Duration: 100 QT Interval:  428 QTC Calculation: 437 R Axis:   70 Text Interpretation:  Normal sinus rhythm Normal ECG Confirmed by Orpah Greek [phone]) on 06/05/2017 3:50:31 AM   Radiology Ct Angio Head W Or Wo Contrast  Result Date: 06/05/2017 CLINICAL DATA:  Initial evaluation for acute left-sided weakness. EXAM: CT ANGIOGRAPHY HEAD AND NECK TECHNIQUE: Multidetector CT imaging of the head and neck was performed using the standard protocol during bolus administration of intravenous contrast. Multiplanar CT image reconstructions and MIPs were obtained to evaluate the vascular anatomy. Carotid stenosis measurements (when applicable) are obtained utilizing NASCET criteria, using the distal internal carotid diameter as the denominator. CONTRAST:  78mL ISOVUE-370 IOPAMIDOL (ISOVUE-370) INJECTION 76% COMPARISON:  Prior CT performed earlier the same day. FINDINGS: CTA NECK FINDINGS Aortic arch: Visualized aortic arch of normal caliber with normal branch pattern. No flow-limiting stenosis about the origin of the great vessels. Visualized subclavian arteries widely patent. Right carotid  system: Right common and internal carotid arteries widely patent without stenosis, dissection, or occlusion. No atheromatous narrowing about the right carotid bifurcation. Left carotid system: Left common and internal carotid arteries widely patent without stenosis, dissection, or occlusion. No atheromatous narrowing about the left carotid bifurcation. Vertebral arteries: Left vertebral artery arises separately from the aortic arch. Vertebral arteries widely patent within the neck without stenosis, dissection, or occlusion. Skeleton: No acute osseus abnormality. No worrisome lytic or blastic osseous lesions. Mild degenerative disc bulge at C6-7 without significant stenosis. Other neck: No acute soft tissue abnormality within the neck. Salivary glands normal. Thyroid normal. No adenopathy. Upper chest: Visualized upper chest within normal limits. Partially visualized lungs are clear. Review of the MIP images confirms the above findings CTA HEAD FINDINGS Anterior circulation: Internal carotid arteries widely patent to the termini without stenosis. Right A1 patent. Absent/hypoplastic left A1, accounting for the diminutive left ICA is compared to the right. Normal anterior communicating artery. Anterior cerebral arteries widely patent to their distal aspects without stenosis. No M1 stenosis or occlusion. No proximal M2 occlusion. Distal MCA branches well perfused and  symmetric. Posterior circulation: Vertebral arteries widely patent to the vertebrobasilar junction without stenosis. Posterior inferior cerebral arteries patent bilaterally. Basilar artery widely patent to its distal aspect. Superior cerebral arteries patent bilaterally. Both of the posterior cerebral artery supplied via the basilar and are well perfused to their distal aspects without stenosis. Venous sinuses: Patent. Anatomic variants: Hypoplastic/absent left A1 with the anterior cerebral artery supplied via the right carotid artery system. No aneurysm.  Delayed phase: Not performed. Review of the MIP images confirms the above findings IMPRESSION: Normal CTA of the head and neck with no evidence for emergent large vessel occlusion. No significant atheromatous disease for patient age. No hemodynamically significant or correctable stenosis. These results were communicated to Rory Percy at 3:28 amon 5/21/2019by text page via the [facility] messaging system. Electronically Signed   By: Jeannine Boga M.D.   On: 06/05/2017 03:43   Ct Angio Neck W Or Wo Contrast  Result Date: 06/05/2017 CLINICAL DATA:  Initial evaluation for acute left-sided weakness. EXAM: CT ANGIOGRAPHY HEAD AND NECK TECHNIQUE: Multidetector CT imaging of the head and neck was performed using the standard protocol during bolus administration of intravenous contrast. Multiplanar CT image reconstructions and MIPs were obtained to evaluate the vascular anatomy. Carotid stenosis measurements (when applicable) are obtained utilizing NASCET criteria, using the distal internal carotid diameter as the denominator. CONTRAST:  73mL ISOVUE-370 IOPAMIDOL (ISOVUE-370) INJECTION 76% COMPARISON:  Prior CT performed earlier the same day. FINDINGS: CTA NECK FINDINGS Aortic arch: Visualized aortic arch of normal caliber with normal branch pattern. No flow-limiting stenosis about the origin of the great vessels. Visualized subclavian arteries widely patent. Right carotid system: Right common and internal carotid arteries widely patent without stenosis, dissection, or occlusion. No atheromatous narrowing about the right carotid bifurcation. Left carotid system: Left common and internal carotid arteries widely patent without stenosis, dissection, or occlusion. No atheromatous narrowing about the left carotid bifurcation. Vertebral arteries: Left vertebral artery arises separately from the aortic arch. Vertebral arteries widely patent within the neck without stenosis, dissection, or occlusion. Skeleton: No acute osseus  abnormality. No worrisome lytic or blastic osseous lesions. Mild degenerative disc bulge at C6-7 without significant stenosis. Other neck: No acute soft tissue abnormality within the neck. Salivary glands normal. Thyroid normal. No adenopathy. Upper chest: Visualized upper chest within normal limits. Partially visualized lungs are clear. Review of the MIP images confirms the above findings CTA HEAD FINDINGS Anterior circulation: Internal carotid arteries widely patent to the termini without stenosis. Right A1 patent. Absent/hypoplastic left A1, accounting for the diminutive left ICA is compared to the right. Normal anterior communicating artery. Anterior cerebral arteries widely patent to their distal aspects without stenosis. No M1 stenosis or occlusion. No proximal M2 occlusion. Distal MCA branches well perfused and symmetric. Posterior circulation: Vertebral arteries widely patent to the vertebrobasilar junction without stenosis. Posterior inferior cerebral arteries patent bilaterally. Basilar artery widely patent to its distal aspect. Superior cerebral arteries patent bilaterally. Both of the posterior cerebral artery supplied via the basilar and are well perfused to their distal aspects without stenosis. Venous sinuses: Patent. Anatomic variants: Hypoplastic/absent left A1 with the anterior cerebral artery supplied via the right carotid artery system. No aneurysm. Delayed phase: Not performed. Review of the MIP images confirms the above findings IMPRESSION: Normal CTA of the head and neck with no evidence for emergent large vessel occlusion. No significant atheromatous disease for patient age. No hemodynamically significant or correctable stenosis. These results were communicated to Rory Percy at 3:28 amon 5/21/2019by text page via  the [employer] system. Electronically Signed   By: Jeannine Boga M.D.   On: 06/05/2017 03:43   Dg Chest Port 1 View  Result Date: 06/05/2017 CLINICAL DATA:  Stroke.   Left-sided weakness. EXAM: PORTABLE CHEST 1 VIEW COMPARISON:  None. FINDINGS: Mild cardiomegaly. Mild aortic tortuosity, otherwise normal mediastinal contours. No pulmonary edema. No focal airspace disease, pleural effusion or pneumothorax. No acute osseous abnormalities. IMPRESSION: Mild cardiomegaly and tortuous thoracic aorta. No acute chest process. Electronically Signed   By: Jeb Levering M.D.   On: 06/05/2017 03:33   Ct Head Code Stroke Wo Contrast  Result Date: 06/05/2017 CLINICAL DATA:  Code stroke. Initial evaluation for acute left-sided weakness. EXAM: CT HEAD WITHOUT CONTRAST TECHNIQUE: Contiguous axial images were obtained from the base of the skull through the vertex without intravenous contrast. COMPARISON:  None. FINDINGS: Brain: Cerebral volume within normal limits. No acute intracranial hemorrhage. No acute large vessel territory infarct. No mass lesion, midline shift or mass effect. No hydrocephalus. No extra-axial fluid collection. Vascular: No asymmetric hyperdense vessel. Skull: Scalp soft tissues and calvarium within normal limits. Sinuses/Orbits: Globes and orbital soft tissues within normal limits. Scattered mucosal thickening throughout the ethmoidal air cells and maxillary sinuses with probable mucocele at the left ethmoidal air cells. Mastoid air cells are clear. Other: None. ASPECTS [facility] Stroke Program Early CT Score) - Ganglionic level infarction (caudate, lentiform nuclei, internal capsule, insula, M1-M3 cortex): 7 - Supraganglionic infarction (M4-M6 cortex): 3 Total score (0-10 with 10 being normal): 10 IMPRESSION: 1. No acute intracranial infarct or other abnormality identified. 2. ASPECTS is 10. These results were communicated to Rory Percy at 3:10 amon 5/21/2019by text page via the [facility] messaging system. Electronically Signed   By: Jeannine Boga M.D.   On: 06/05/2017 03:12    Procedures Procedures (including critical care time)  Medications Ordered in  ED Medications  labetalol (NORMODYNE,TRANDATE) 5 MG/ML injection (has no administration in time range)   stroke: mapping our early stages of recovery book (has no administration in time range)  0.9 %  sodium chloride infusion (has no administration in time range)  acetaminophen (TYLENOL) tablet 650 mg (has no administration in time range)    Or  acetaminophen (TYLENOL) solution 650 mg (has no administration in time range)    Or  acetaminophen (TYLENOL) suppository 650 mg (has no administration in time range)  senna-docusate (Senokot-S) tablet 1 tablet (has no administration in time range)  alteplase (ACTIVASE) 1 mg/mL infusion 65.3 mg (65.3 mg Intravenous New Bag/Given 06/05/17 0256)    Followed by  0.9 %  sodium chloride infusion (has no administration in time range)  nicardipine (CARDENE) 20mg  in 0.86% saline 245ml IV infusion (0.1 mg/ml) (has no administration in time range)  niCARdipine in saline (CARDENE-IV) 20-0.86 MG/200ML-% infusion SOLN (has no administration in time range)  iopamidol (ISOVUE-370) 76 % injection 50 mL (50 mLs Intravenous Contrast Given 06/05/17 0304)     Initial Impression / Assessment and Plan / ED Course  I have reviewed the triage vital signs and the nursing notes.  Pertinent labs & imaging results that were available during my care of the patient were reviewed by me and considered in my medical decision making (see chart for details).     Patient presented to emergency department as a code stroke.  Evaluation revealed dense left flaccid hemiparesis.  Last known normal was 2 and half hours prior to arrival.  Patient evaluated in conjunction with Dr. Rory Percy, went to radiology for screening CT scan.  No bleed was noted.  There were no contraindications to TPA, was administered TPA, directed by Dr. Rory Percy.  Patient will be admitted to neuro ICU.  CRITICAL CARE Performed by: Orpah Greek   Total critical care time: 30 minutes  Critical care time was  exclusive of separately billable procedures and treating other patients.  Critical care was necessary to treat or prevent imminent or life-threatening deterioration.  Critical care was time spent personally by me on the following activities: development of treatment plan with patient and/or surrogate as well as nursing, discussions with consultants, evaluation of patient's response to treatment, examination of patient, obtaining history from patient or surrogate, ordering and performing treatments and interventions, ordering and review of laboratory studies, ordering and review of radiographic studies, pulse oximetry and re-evaluation of patient's condition.   Final Clinical Impressions(s) / ED Diagnoses   Final diagnoses:  Stroke (cerebrum) [facility])    ED Discharge Orders    None       Marina Boerner, Gwenyth Allegra, MD 06/05/17 3013    Orpah Greek, MD 06/05/17 [phone]

## 2017-06-05 NOTE — ED Triage Notes (Signed)
Pt arriving from home via [facility] for left sided weakness. Pt reports that he was normal at 0000 before he went to sleep. He woke up around 0200 with left sided arm and leg weakness.

## 2017-06-05 NOTE — Evaluation (Signed)
Speech Language Pathology Evaluation Patient Details Name: Robert Sandoval MRN: [id number] DOB: 01/04/1961 Today's Date: 06/05/2017 Time: 5916-3846 SLP Time Calculation (min) (ACUTE ONLY): 12 min  Problem List:  Patient Active Problem List   Diagnosis Date Noted  . Acute ischemic stroke ([place]) 06/05/2017  . Prostate nodule 10/30/2016  . Hypertension 08/05/2016  . External hemorrhoids without complication 65/99/3570  . Melanosis of colon 11/23/2015  . Diverticulosis of colon without hemorrhage 11/23/2015  . Internal hemorrhoids 11/23/2015  . Essential hypertension, benign 09/14/2014   Past Medical History:  Past Medical History:  Diagnosis Date  . Allergy   . Hyperlipidemia   . Hypertension    Past Surgical History: History reviewed. No pertinent surgical history. HPI:  57 y.o. male with PMH HTN brought to ED with left hemiplegia, no facial involvement.  IV TPA administered.     Assessment / Plan / Recommendation Clinical Impression  Pt presents with normal speech/language with fluent output, normal cognition - normal memory, problem solving, awareness.  Pt is good historian.  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  No SLP f/u warrented - our services will sign off.      SLP Assessment  SLP Recommendation/Assessment: Patient does not need any further Speech Lanaguage Pathology Services SLP Visit Diagnosis: Cognitive communication deficit (R41.841)    Follow Up Recommendations       Frequency and Duration           SLP Evaluation Cognition  Overall Cognitive Status: Within Functional Limits for tasks assessed Arousal/Alertness: Awake/alert Orientation Level: Oriented X4 Attention: Selective Selective Attention: Appears intact Memory: Appears intact Awareness: Appears intact Problem Solving: Appears intact       Comprehension  Auditory Comprehension Overall Auditory Comprehension: Appears within  functional limits for tasks assessed Reading Comprehension Reading Status: Not tested    Expression Expression Primary Mode of Expression: Verbal Verbal Expression Overall Verbal Expression: Appears within functional limits for tasks assessed   Oral / Motor  Oral Motor/Sensory Function Overall Oral Motor/Sensory Function: Within functional limits Motor Speech Overall Motor Speech: Appears within functional limits for tasks assessed   GO                    Juan Quam Laurice 06/05/2017, 2:35 PM

## 2017-06-05 NOTE — Code Documentation (Signed)
57 yo male to [facility] via [place] code stoke with new onset left sided weakness.  Upon arrival, pt was alert with left arm and leg weakness. LSW 0000 when he went to bed.  He awoke at approx.0200 with symptoms. Dr. Rory Percy and Dr. Waverly Ferrari at bedside. CBG  97.  CT head performed and neg for hemmorage.  NIHSS 8. BP managed with labetolol and cardene if needed. TPA (65.3 mg total dose) prepared and given per order and policy. CTA performed after TPA initiated.  Pts wife is at the bedside.  Dr. Rory Percy updated pt and his wife after the CTA.

## 2017-06-05 NOTE — Progress Notes (Signed)
*  PRELIMINARY RESULTS* Echocardiogram 2D Echocardiogram has been performed.  Robert Sandoval 06/05/2017, 10:38 AM

## 2017-06-05 NOTE — Progress Notes (Signed)
Inpatient Rehabilitation  Per PT request, patient was screened by Cristianna Cyr for appropriateness for an Inpatient Acute Rehab consult.  At this time we are recommending an Inpatient Rehab consult.  Please order if you are agreeable.    Tajah Noguchi, M.A., CCC/SLP Admission Coordinator  [facility]  Cell [phone]  

## 2017-06-06 ENCOUNTER — Inpatient Hospital Stay (HOSPITAL_COMMUNITY): Payer: Managed Care, Other (non HMO)

## 2017-06-06 ENCOUNTER — Other Ambulatory Visit: Payer: Self-pay

## 2017-06-06 DIAGNOSIS — R269 Unspecified abnormalities of gait and mobility: Secondary | ICD-10-CM

## 2017-06-06 DIAGNOSIS — I69398 Other sequelae of cerebral infarction: Secondary | ICD-10-CM

## 2017-06-06 DIAGNOSIS — I10 Essential (primary) hypertension: Secondary | ICD-10-CM

## 2017-06-06 DIAGNOSIS — I639 Cerebral infarction, unspecified: Secondary | ICD-10-CM

## 2017-06-06 DIAGNOSIS — E785 Hyperlipidemia, unspecified: Secondary | ICD-10-CM

## 2017-06-06 DIAGNOSIS — G8114 Spastic hemiplegia affecting left nondominant side: Secondary | ICD-10-CM

## 2017-06-06 MED ORDER — GABAPENTIN 300 MG PO CAPS: 300.0000 mg | ORAL_CAPSULE | Freq: Three times a day (TID) | ORAL | Status: DC | PRN

## 2017-06-06 MED ORDER — TAMSULOSIN HCL 0.4 MG PO CAPS
0.4000 mg | ORAL_CAPSULE | Freq: Every day | ORAL | Status: DC
  Administered 2017-06-06 – 2017-06-07 (×2): 0.4 mg via ORAL
  Filled 2017-06-06 (×2): qty 1

## 2017-06-06 MED ORDER — LISINOPRIL 20 MG PO TABS: 20.0000 mg | ORAL_TABLET | Freq: Every day | ORAL | Status: DC

## 2017-06-06 MED ORDER — ASPIRIN EC 81 MG PO TBEC
81.0000 mg | DELAYED_RELEASE_TABLET | Freq: Every day | ORAL | Status: DC
  Administered 2017-06-06 – 2017-06-07 (×2): 81 mg via ORAL
  Filled 2017-06-06 (×2): qty 1

## 2017-06-06 MED ORDER — LISINOPRIL 20 MG PO TABS
20.0000 mg | ORAL_TABLET | Freq: Two times a day (BID) | ORAL | Status: DC
  Administered 2017-06-06 – 2017-06-07 (×3): 20 mg via ORAL
  Filled 2017-06-06 (×3): qty 1

## 2017-06-06 MED ORDER — POLYETHYLENE GLYCOL 3350 17 G PO PACK
17.0000 g | PACK | Freq: Every day | ORAL | Status: DC | PRN
  Administered 2017-06-06: 17 g via ORAL
  Filled 2017-06-06: qty 1

## 2017-06-06 MED ORDER — CLOPIDOGREL BISULFATE 75 MG PO TABS
75.0000 mg | ORAL_TABLET | Freq: Every day | ORAL | Status: DC
  Administered 2017-06-06 – 2017-06-07 (×2): 75 mg via ORAL
  Filled 2017-06-06 (×2): qty 1

## 2017-06-06 NOTE — PMR Pre-admission (Signed)
PMR Admission Coordinator Pre-Admission Assessment  Patient: Robert Sandoval is an 57 y.o., male MRN: [id number] DOB: 1960-07-17 Height: 5\' 8"  (172.7 cm) Weight: 72.2 kg (159 lb 2.8 oz)              Insurance Information HMO:     PPO:      PCP:      IPA:      80/20:      OTHER:  PRIMARY: [employer]    Policy#: [id number]      Subscriber: Self CM Name: Sharee Pimple      Phone#: [phone]     Fax#: [phone] Pre-Cert#: [id number] Approved for 7 days with review due on May 30th     Employer:  Benefits:  Phone #: [phone]     Name:  Eff. Date: 01/16/17     Deduct: $2,000      Out of Pocket Max: $6,350      Life Max: NA CIR: Coverage at 75% with 25% co-pay      SNF: Coverage at 75% with 25% co-pay (120 day max/yr) Outpatient: Coverage at 75%     Co-Pay: 25%  (60 combined visit limit) Home Health: Coverage at 75%        Co-Pay: 25% (120 visit/yr) DME: Coverage at 75%     Co-Pay: 25% Providers: In-Network   Medicaid Application Date:       Case Manager:  Disability Application Date:       Case Worker:   Emergency Facilities manager Information    Name Relation Home Work Mobile   Mooty,Imer Brother [phone]       Current Medical History  Patient Admitting Diagnosis: Right basal ganglia/Corona Radiata infarct, thrombotic  History of Present Illness: Robert Sandoval is a 57 year old right-handed male history of hypertension hyperlipidemia. Presented 06/05/2017 with acute onset of left-sided weakness.  Initial blood pressures with systolics in the 226J.  Cranial CT scan negative.  Urine drug screen negative.  Patient did receive TPA.  CT angiogram of head and neck negative for emergent large vessel occlusion or stenosis.  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  Tiny right acute occipital posterior watershed territory infarction.  Echocardiogram with ejection fraction 33% grade 1 diastolic dysfunction.  Cardene drip initiated for blood pressure control and lisinopril was  adjusted.  Neurology consulted maintained on aspirin and Plavix for CVA prophylaxis. Pt is off Cardene drip.Tolerating a regular diet.  Physical and occupational therapy evaluations completed with recommendations of physical medicine rehab consult.  Patient is to be admitted for a comprehensive rehab program on 06/07/17. .   Total: 3    Past Medical History  Past Medical History:  Diagnosis Date  . Allergy   . Hyperlipidemia   . Hypertension     Family History  family history includes Asthma in his father; Hyperlipidemia in his father.  Prior Rehab/Hospitalizations:  Has the patient had major surgery during 100 days prior to admission? No; of note pt did report dental work with use of local anesthesia in March   Current Medications   Current Facility-Administered Medications:  .   stroke: mapping our early stages of recovery book, , Does not apply, Once, Rosalin Hawking, MD .  acetaminophen (TYLENOL) tablet 650 mg, 650 mg, Oral, Q4H PRN, 650 mg at 06/06/17 1248 **OR** [DISCONTINUED] acetaminophen (TYLENOL) solution 650 mg, 650 mg, Per Tube, Q4H PRN **OR** [DISCONTINUED] acetaminophen (TYLENOL) suppository 650 mg, 650 mg, Rectal, Q4H PRN, Amie Portland, MD .  aspirin EC tablet 81  mg, 81 mg, Oral, Daily, Rosalin Hawking, MD, 81 mg at 06/06/17 1438 .  atorvastatin (LIPITOR) tablet 20 mg, 20 mg, Oral, q1800, Rosalin Hawking, MD, 20 mg at 06/06/17 1808 .  clopidogrel (PLAVIX) tablet 75 mg, 75 mg, Oral, Daily, Rosalin Hawking, MD, 75 mg at 06/06/17 1437 .  gabapentin (NEURONTIN) capsule 300 mg, 300 mg, Oral, TID PRN, Rosalin Hawking, MD .  lisinopril (PRINIVIL,ZESTRIL) tablet 20 mg, 20 mg, Oral, BID, Rosalin Hawking, MD, 20 mg at 06/06/17 1438 .  polyethylene glycol (MIRALAX / GLYCOLAX) packet 17 g, 17 g, Oral, Daily PRN, Rosalin Hawking, MD .  senna-docusate (Senokot-S) tablet 1 tablet, 1 tablet, Oral, QHS PRN, Amie Portland, MD .  tamsulosin [facility]) capsule 0.4 mg, 0.4 mg, Oral, Daily, Rosalin Hawking, MD, 0.4 mg at  06/06/17 1438  Patients Current Diet:  Diet Order           Diet regular Room service appropriate? 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  Independent  Indoor Mobility: Did the patient need assistance with walking from room to room (with or without device)? Independent  Stairs: Did the patient need assistance with internal or external stairs (with or without device)? Independent  Functional Cognition: Did the patient need help planning regular tasks such as shopping or remembering to take medications? Independent  Current Functional Level Cognition  Arousal/Alertness: Awake/alert Overall Cognitive Status: Within Functional Limits for tasks assessed Orientation Level: Oriented X4 Attention: Selective Selective Attention: Appears intact Memory: Appears intact Awareness: Appears intact Problem Solving: Appears intact    Extremity Assessment (includes Sensation/Coordination)  Upper Extremity Assessment: LUE deficits/detail LUE Deficits / Details: AROM WFL with 3+/5 strength throughout (shoulder, elbow, wrist, grasp). Diminished coordination and proprioception noted.  LUE Sensation: decreased light touch,  decreased proprioception LUE Coordination: decreased fine motor, decreased gross motor  Lower Extremity Assessment: Defer to PT evaluation LLE Deficits / Details: AAROM WFL, strength hip flexion 2+/5, knee extension 2+/5, ankle DF 3=/5 LLE Sensation: decreased light touch(proprioception NT)    ADLs  Overall ADL's : Needs assistance/impaired Eating/Feeding: Set up, Sitting Grooming: Minimal assistance, Standing Upper Body Bathing: Minimal assistance, Sitting Lower Body Bathing: Sit to/from stand, Moderate assistance Upper Body Dressing : Minimal assistance, Sitting Lower Body Dressing: Sit to/from stand, Moderate assistance Toilet Transfer: Ambulation, RW, Moderate assistance Toilet Transfer Details (indicate cue type and reason): Assist for balance and coordination Toileting- Clothing Manipulation and Hygiene: Minimal assistance, Sit to/from stand Functional mobility during ADLs: Rolling walker, Moderate assistance General ADL Comments: Pt requiring assistance for balance and coordination when standing. Limited by decreased LUE coordination and proprioception.     Mobility  Overal bed mobility: Needs Assistance Bed Mobility: Sit to Supine Supine to sit: Min assist, HOB elevated Sit to supine: Min guard General  bed mobility comments: Cues for technique and min guard assist for safety.     Transfers  Overall transfer level: Needs assistance Equipment used: Rolling walker (2 wheeled) Transfers: Sit to/from Stand Sit to Stand: Min assist Stand pivot transfers: Mod assist, +2 safety/equipment General transfer comment: MIn assist to power up to full standing position. Mod assist while ambulating.     Ambulation / Gait / Stairs / Wheelchair Mobility  Ambulation/Gait Ambulation/Gait assistance: Min assist, +2 safety/equipment Ambulation Distance (Feet): 60 Feet(x2) Assistive device: Rolling walker (2 wheeled) Gait Pattern/deviations: Step-to pattern, Decreased stride length, Decreased  step length - right, Decreased weight shift to left, Narrow base of support General Gait Details: v/c's to complete L quad set to prevent buckling when advancing the R LE. Pt with only 1 episode of L knee buckling but able to catch self, most likely due to fatigue as it was at the end. minA at L UE on walker due to weak grip and hand rotating off with fatigue. Pt began to have step through pattern towards the end of the walk as well  Gait velocity: slow Gait velocity interpretation: <1.8 ft/sec, indicate of risk for recurrent falls    Posture / Balance Balance Overall balance assessment: Needs assistance Sitting-balance support: No upper extremity supported Sitting balance-Leahy Scale: Good Standing balance support: Bilateral upper extremity supported Standing balance-Leahy Scale: Poor Standing balance comment: UE support for balance    Special needs/care consideration BiPAP/CPAP: No CPM: No Continuous Drip IV: No Dialysis: No       Days: NA Life Vest: No Oxygen: No Special Bed: No Trach Size: No Wound Vac (area): No      Location: NA Skin: No                              Location: NA Bowel mgmt: 5/19 (pt reports taking some type of powder daily for regularity) Bladder mgmt:Continent use of urinal.  Diabetic mgmt: NA     Previous Home Environment Living Arrangements: Spouse/significant other  Lives With: Spouse Available Help at Discharge: Family Type of Home: Apartment Home Layout: One level Home Access: Stairs to enter Entrance Stairs-Rails: Left Entrance Stairs-Number of Steps: 2 Bathroom Shower/Tub: Chiropodist: Standard Home Care Services: No  Discharge Living Setting Plans for Discharge Living Setting: Patient's home, Lives with (comment), Apartment(lives with wife) Type of Home at Discharge: Apartment Discharge Home Layout: One level Discharge Home Access: Stairs to enter Entrance Stairs-Rails: Left Entrance Stairs-Number of Steps:  3 Discharge Bathroom Shower/Tub: Tub/shower unit(pt uses a Radio broadcast assistant inside his tub as a seat) Discharge Bathroom Toilet: Standard Discharge Bathroom Accessibility: Yes How Accessible: Accessible via walker Does the patient have any problems obtaining your medications?: No  Social/Family/Support Systems Patient Roles: Spouse, Parent(pt works full time as Geophysicist/field seismologist; has grown son out of the home) Sport and exercise psychologist Information: wife: Jonelle Sidle; brother: Imer Anticipated Caregiver: Wife and Brother Anticipated Ambulance person Information: Wife: [phone]; Brother: [phone] Ability/Limitations of Caregiver: both can physically assist if needed Caregiver Availability: 24/7(wife and brother can split shifts) Discharge Plan Discussed with Primary Caregiver: Yes Is Caregiver In Agreement with Plan?: Yes Does Caregiver/Family have Issues with Lodging/Transportation while Pt is in Rehab?: No   Goals/Additional Needs Patient/Family Goal for Rehab: Mod I PT/OT SLP NA Expected length of stay: 7 Cultural Considerations: NA Dietary Needs: Soft food, thin liquids Equipment Needs: TBD Special Service Needs: NA Additional Information: NA Pt/Family Agrees to Admission and willing  to participate: Yes; Of note pt speaks [place] well; pt's wife speaks Spanish and pt translates for her.  Program Orientation Provided & Reviewed with Pt/Caregiver Including Roles  & Responsibilities: Yes(reveiwed with pt) Additional Information Needs: NA  Barriers to Discharge: Home environment access/layout  Barriers to Discharge Comments: (3 steps to enter home)   Decrease burden of Care through IP rehab admission: NA    Possible need for SNF placement upon discharge:Not anticipated.    Patient Condition: This patient's condition remains as documented in the consult dated 06/06/17, in which the Rehabilitation Physician determined and documented that the patient's condition is appropriate for intensive rehabilitative care  in an inpatient rehabilitation facility. Will admit to inpatient rehab today.  Preadmission Screen Completed By:  Jhonnie Garner, 06/06/2017 6:13 PM ______________________________________________________________________   Discussed status with Dr. Letta Pate on 06/07/17 at 12:08AM and received telephone approval for admission today.  Admission Coordinator:  Jhonnie Garner, time 12:08AM/Date: 06/07/17

## 2017-06-06 NOTE — Evaluation (Addendum)
Occupational Therapy Evaluation Patient Details Name: Robert Sandoval MRN: [id number] DOB: 1960-12-08 Today's Date: 06/06/2017    History of Present Illness  Luke Falero is a 57 y.o. male past medical history of hypertension admitted as an acute code stroke for left-sided weakness.  Underwent TPA administration.    Clinical Impression   PTA, pt reports independence with ADL and functional mobility and working full time as a Scientist, research (life sciences). Pt currently requiring min assist for UB ADL, mod assist for LB ADL, mod assist for ambulating toilet transfers with RW, and min assist for standing grooming tasks. Pt presents with decreased balance; L LE strength and coordination; and L UE strength, coordination, and sensation impacting his ability to participate in ADL at PLOF. Pt would benefit from continued OT services while admitted to improve independence and safety with ADL and functional mobility. He is an excellent candidate for CIR level rehabilitation to maximize return to independent PLOF. Will continue to follow while admitted.     Follow Up Recommendations  CIR;Supervision/Assistance - 24 hour    Equipment Recommendations  Other (comment)(defer to next venue of care)    Recommendations for Other Services Rehab consult     Precautions / Restrictions Precautions Precautions: Fall Precaution Comments: BP< 180/105 Restrictions Weight Bearing Restrictions: No      Mobility Bed Mobility Overal bed mobility: Needs Assistance Bed Mobility: Sit to Supine       Sit to supine: Min guard   General bed mobility comments: Cues for technique and min guard assist for safety.   Transfers Overall transfer level: Needs assistance Equipment used: Rolling walker (2 wheeled) Transfers: Sit to/from Stand Sit to Stand: Min assist         General transfer comment: MIn assist to power up to full standing position. Mod assist while ambulating.     Balance Overall balance assessment: Needs  assistance Sitting-balance support: No upper extremity supported Sitting balance-Leahy Scale: Good     Standing balance support: Bilateral upper extremity supported Standing balance-Leahy Scale: Poor Standing balance comment: UE support for balance                           ADL either performed or assessed with clinical judgement   ADL Overall ADL's : Needs assistance/impaired Eating/Feeding: Set up;Sitting   Grooming: Minimal assistance;Standing   Upper Body Bathing: Minimal assistance;Sitting   Lower Body Bathing: Sit to/from stand;Moderate assistance   Upper Body Dressing : Minimal assistance;Sitting   Lower Body Dressing: Sit to/from stand;Moderate assistance   Toilet Transfer: Ambulation;RW;Moderate assistance Toilet Transfer Details (indicate cue type and reason): Assist for balance and coordination Toileting- Clothing Manipulation and Hygiene: Minimal assistance;Sit to/from stand       Functional mobility during ADLs: Rolling walker;Moderate assistance General ADL Comments: Pt requiring assistance for balance and coordination when standing. Limited by decreased LUE coordination and proprioception.      Vision Patient Visual Report: No change from baseline Vision Assessment?: Yes Eye Alignment: Within Functional Limits Ocular Range of Motion: Within Functional Limits Alignment/Gaze Preference: Within Defined Limits Tracking/Visual Pursuits: Able to track stimulus in all quads without difficulty Saccades: Within functional limits Convergence: Within functional limits Visual Fields: No apparent deficits Additional Comments: No functional visual deficits noted during assessment. Pt able to read menu without difficulty and scan to locate items on command.      Perception     Praxis      Pertinent Vitals/Pain Pain Assessment: No/denies pain  Hand Dominance Right   Extremity/Trunk Assessment Upper Extremity Assessment Upper Extremity  Assessment: LUE deficits/detail LUE Deficits / Details: AROM WFL with 3+/5 strength throughout (shoulder, elbow, wrist, grasp). Diminished coordination and proprioception noted.  LUE Sensation: decreased light touch;decreased proprioception LUE Coordination: decreased fine motor;decreased gross motor   Lower Extremity Assessment Lower Extremity Assessment: Defer to PT evaluation       Communication Communication Communication: No difficulties(proficient in English but Spanish is first language)   Cognition Arousal/Alertness: Awake/alert Behavior During Therapy: WFL for tasks assessed/performed Overall Cognitive Status: Within Functional Limits for tasks assessed                                     General Comments  wife in room at onset of session but stepped out; VSS    Exercises     Shoulder Instructions      Home Living Family/patient expects to be discharged to:: Private residence Living Arrangements: Spouse/significant other Available Help at Discharge: Family Type of Home: Apartment Home Access: Stairs to enter Technical brewer of Steps: 2 Entrance Stairs-Rails: Left Home Layout: One level     Bathroom Shower/Tub: Teacher, early years/pre: Standard     Home Equipment: None      Lives With: Spouse    Prior Functioning/Environment Level of Independence: Independent        Comments: 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        OT Problem List: Decreased strength;Decreased range of motion;Decreased activity tolerance;Impaired balance (sitting and/or standing);Decreased coordination;Decreased safety awareness;Decreased knowledge of use of DME or AE;Decreased knowledge of precautions;Pain;Impaired UE functional use      OT Treatment/Interventions: Self-care/ADL training;Therapeutic exercise;Energy conservation;DME and/or AE instruction;Therapeutic activities;Patient/family education;Balance training;Cognitive  remediation/compensation    OT Goals(Current goals can be found in the care plan section) Acute Rehab OT Goals Patient Stated Goal: return to independent OT Goal Formulation: With patient Time For Goal Achievement: 06/20/17 Potential to Achieve Goals: Good ADL Goals Pt Will Perform Grooming: with supervision;standing Pt Will Perform Upper Body Dressing: with min guard assist;sitting Pt Will Perform Lower Body Dressing: with min guard assist;sit to/from stand Pt Will Transfer to Toilet: with supervision;ambulating;regular height toilet Pt Will Perform Toileting - Clothing Manipulation and hygiene: with supervision;sit to/from stand Pt/caregiver will Perform Home Exercise Program: Left upper extremity;With Supervision;With written HEP provided(strength, coordination, sensation) Additional ADL Goal #1: Pt will demonstrate improved L UE fine motor coordination to manipulate a variety of clothing fasteners with increased time.  OT Frequency: Min 2X/week   Barriers to D/C:            Co-evaluation              AM-PAC PT "6 Clicks" Daily Activity     Outcome Measure Help from another person eating meals?: A Little Help from another person taking care of personal grooming?: A Little Help from another person toileting, which includes using toliet, bedpan, or urinal?: A Lot Help from another person bathing (including washing, rinsing, drying)?: A Lot Help from another person to put on and taking off regular upper body clothing?: A Little Help from another person to put on and taking off regular lower body clothing?: A Lot 6 Click Score: 15   End of Session Equipment Utilized During Treatment: Gait belt;Rolling walker Nurse Communication: Mobility status(nurse in room at end of session)  Activity Tolerance: Patient tolerated treatment  well Patient left: in bed;with call bell/phone within reach;with bed alarm set;with nursing/sitter in room  OT Visit Diagnosis: Other abnormalities of  gait and mobility (R26.89);Hemiplegia and hemiparesis;Ataxia, unspecified (R27.0) Hemiplegia - Right/Left: Left Hemiplegia - dominant/non-dominant: Non-Dominant Hemiplegia - caused by: Cerebral infarction                Time: 1410-1430 OT Time Calculation (min): 20 min Charges:  OT General Charges $OT Visit: 1 Visit OT Evaluation $OT Eval Moderate Complexity: 1 Mod G-Codes:     Norman Herrlich, MS OTR/L  Pager: [place] A Lamia Mariner 06/06/2017, 3:49 PM

## 2017-06-06 NOTE — Consult Note (Signed)
Physical Medicine and Rehabilitation Consult Reason for Consult: Left-sided weakness Referring Physician: Dr.Xu   HPI: Robert Sandoval is a 57 y.o. right-handed male with history of hypertension and hyperlipidemia.  Per chart review patient lives with spouse.  One level apartment with 2 steps to entry.  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  Presented 06/05/2017 with acute onset of left-sided weakness.  Initial blood pressures with systolics 027O.  Cranial CT scan negative for acute changes.  UDS was negative.  Patient did receive TPA.  CT angiogram of head and neck negative for emergent large vessel occlusion or stenosis.  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  Tiny right acute occipital posterior watershed territory infarction.  Echocardiogram with ejection fraction of 53% grade 1 diastolic dysfunction.  Cardene drip for blood pressure control.  Neurology consulted with work-up presently ongoing.  Physical therapy evaluation completed 06/05/2017 with recommendations of physical medicine rehab consult.   Review of Systems  Constitutional: Negative for chills and fever.  HENT: Negative for hearing loss.   Eyes: Negative for blurred vision and double vision.  Respiratory: Negative for shortness of breath.   Cardiovascular: Negative for chest pain, palpitations and leg swelling.  Gastrointestinal: Positive for constipation. Negative for nausea and vomiting.  Genitourinary: Positive for urgency. Negative for dysuria, flank pain and hematuria.  Skin: Negative for rash.  Neurological: Positive for tingling, sensory change and focal weakness.  All other systems reviewed and are negative.  Past Medical History:  Diagnosis Date  . Allergy   . Hyperlipidemia   . Hypertension    History reviewed. No pertinent surgical history. Family History  Problem Relation Age of Onset  . Hyperlipidemia Father   . Asthma Father     Social History:  reports that he has never smoked. He has never used smokeless tobacco. He reports that he drinks alcohol. He reports that he does not use drugs. Allergies: No Known Allergies Medications Prior to Admission  Medication Sig Dispense Refill  . gabapentin (NEURONTIN) 300 MG capsule Take 300 mg by mouth 3 (three) times daily as needed (for pain).     Marland Kitchen lisinopril (PRINIVIL,ZESTRIL) 20 MG tablet Take 1 tablet (20 mg total) by mouth daily. 90 tablet 3  . tamsulosin (FLOMAX) 0.4 MG CAPS capsule Take 0.4 mg by mouth daily.    . hydrocortisone (ANUSOL-HC) 2.5 % rectal cream Place 1 application rectally at bedtime. (Patient not taking: Reported on 06/05/2017) 30 g 0  . polyethylene glycol powder (GLYCOLAX/MIRALAX) powder Take 17 g by mouth daily. 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 Functional Status:  Mobility: [place] bed mobility: Needs Assistance Bed Mobility: Supine to Sit Supine to sit: HOB elevated, Mod assist General bed mobility comments: assist for L leg and balance with trunk Transfers Overall transfer level: Needs assistance Equipment used: 2 person hand held assist Transfers: Sit to/from Stand, Stand Pivot Transfers Sit to Stand: Min assist, +2 safety/equipment Stand pivot transfers: Mod  assist, +2 safety/equipment General transfer comment: stood initially unaided, but off balance with L LE weakness,  stood with +2 A for safety and lines, stepping L forward first, then L LE buckling assist for stance phase stability with stepping to chair and cues for sequencing bilat UE support      ADL:    Cognition: Cognition Overall Cognitive Status: Within Functional Limits for tasks assessed Arousal/Alertness: Awake/alert Orientation Level: Oriented X4 Attention: Selective Selective Attention: Appears intact Memory: Appears intact Awareness: Appears intact Problem Solving: Appears intact Cognition Arousal/Alertness: Awake/alert Behavior During Therapy: WFL for tasks assessed/performed Overall Cognitive Status: Within Functional Limits for tasks assessed  Blood pressure (!) 122/92, pulse (!) 59, temperature 98.4 F (36.9 C), temperature source Oral, resp. rate 13, height 5\' 8"  (1.727 m), weight 72.2 kg (159 lb 2.8 oz), SpO2 95 %. Physical Exam  Vitals reviewed. Constitutional: He is oriented to person, place, and time. He appears well-developed.  HENT:  Head: Normocephalic.  Eyes: EOM are normal.  Neck: Normal range of motion. Neck supple. No thyromegaly present.  Cardiovascular: Normal rate, regular rhythm and normal heart sounds.  Respiratory: Effort normal and breath sounds normal. No respiratory distress.  GI: Soft. Bowel sounds are normal. He exhibits no distension.  Neurological: He is alert and oriented to person, place, and time.  Skin: Skin is warm and dry.  Neuro:  Tone is normal without evidence of spasticity Cerebellar exam shows + evidence of ataxia on finger nose finger or heel to shin testing Left side only  No evidence of trunkal ataxia  Motor strength is 5/5 in Right and 4/5 in Left deltoid, biceps, triceps, finger flexors and extensors, wrist flexors and extensors, hip flexors, knee flexors and extensors, ankle dorsiflexors, plantar flexors, invertors and evertors, toe flexors and extensors  Sensory exam able to distinguish light touch in the upper and lower  limbs   Cranial nerves II- Visual fields are intact to confrontation testing, no blurring of vision III- no evidence of ptosis, upward, downward and medial gaze intact IV- no vertical diplopia or head tilt V- no facial numbness or masseter weakness VI- no pupil abduction weakness VII- no facial droop, good lid closure VII- normal auditory acuity IX- no pharygeal weakness,  X- no pharyngeal weakness, no hoarseness XI- no trap or SCM weakness XII- no glossal weakness  Sit to stand minG  No results found for this or any previous visit (from the past 24 hour(s)). Ct Angio Head W Or Wo Contrast  Result Date: 06/05/2017 CLINICAL DATA:  Initial evaluation for acute left-sided weakness. EXAM: CT ANGIOGRAPHY HEAD AND NECK TECHNIQUE: Multidetector CT imaging of the head and neck was performed using the standard protocol during bolus administration of intravenous contrast. Multiplanar CT image reconstructions and MIPs were obtained to evaluate the vascular anatomy. Carotid stenosis measurements (when applicable) are obtained utilizing NASCET criteria, using the distal internal carotid diameter as the denominator. CONTRAST:  82mL ISOVUE-370 IOPAMIDOL (ISOVUE-370) INJECTION 76% COMPARISON:  Prior CT performed earlier the same day. FINDINGS: CTA NECK FINDINGS Aortic arch: Visualized aortic arch of normal caliber with normal branch pattern. No flow-limiting stenosis about the origin of the great vessels. Visualized subclavian arteries widely patent. Right carotid system: Right common and internal carotid arteries widely patent without stenosis, dissection, or occlusion. No atheromatous narrowing about the right carotid bifurcation. Left carotid system: Left common and internal carotid arteries widely patent without stenosis, dissection, or occlusion. No atheromatous narrowing about the left carotid bifurcation. Vertebral arteries: Left vertebral artery  arises separately from the aortic arch. Vertebral arteries  widely patent within the neck without stenosis, dissection, or occlusion. Skeleton: No acute osseus abnormality. No worrisome lytic or blastic osseous lesions. Mild degenerative disc bulge at C6-7 without significant stenosis. Other neck: No acute soft tissue abnormality within the neck. Salivary glands normal. Thyroid normal. No adenopathy. Upper chest: Visualized upper chest within normal limits. Partially visualized lungs are clear. Review of the MIP images confirms the above findings CTA HEAD FINDINGS Anterior circulation: Internal carotid arteries widely patent to the termini without stenosis. Right A1 patent. Absent/hypoplastic left A1, accounting for the diminutive left ICA is compared to the right. Normal anterior communicating artery. Anterior cerebral arteries widely patent to their distal aspects without stenosis. No M1 stenosis or occlusion. No proximal M2 occlusion. Distal MCA branches well perfused and symmetric. Posterior circulation: Vertebral arteries widely patent to the vertebrobasilar junction without stenosis. Posterior inferior cerebral arteries patent bilaterally. Basilar artery widely patent to its distal aspect. Superior cerebral arteries patent bilaterally. Both of the posterior cerebral artery supplied via the basilar and are well perfused to their distal aspects without stenosis. Venous sinuses: Patent. Anatomic variants: Hypoplastic/absent left A1 with the anterior cerebral artery supplied via the right carotid artery system. No aneurysm. Delayed phase: Not performed. Review of the MIP images confirms the above findings IMPRESSION: Normal CTA of the head and neck with no evidence for emergent large vessel occlusion. No significant atheromatous disease for patient age. No hemodynamically significant or correctable stenosis. These results were communicated to Rory Percy at 3:28 amon 5/21/2019by text page via the [facility] messaging system. Electronically Signed   By: Jeannine Boga M.D.   On:  06/05/2017 03:43   Ct Angio Neck W Or Wo Contrast  Result Date: 06/05/2017 CLINICAL DATA:  Initial evaluation for acute left-sided weakness. EXAM: CT ANGIOGRAPHY HEAD AND NECK TECHNIQUE: Multidetector CT imaging of the head and neck was performed using the standard protocol during bolus administration of intravenous contrast. Multiplanar CT image reconstructions and MIPs were obtained to evaluate the vascular anatomy. Carotid stenosis measurements (when applicable) are obtained utilizing NASCET criteria, using the distal internal carotid diameter as the denominator. CONTRAST:  72mL ISOVUE-370 IOPAMIDOL (ISOVUE-370) INJECTION 76% COMPARISON:  Prior CT performed earlier the same day. FINDINGS: CTA NECK FINDINGS Aortic arch: Visualized aortic arch of normal caliber with normal branch pattern. No flow-limiting stenosis about the origin of the great vessels. Visualized subclavian arteries widely patent. Right carotid system: Right common and internal carotid arteries widely patent without stenosis, dissection, or occlusion. No atheromatous narrowing about the right carotid bifurcation. Left carotid system: Left common and internal carotid arteries widely patent without stenosis, dissection, or occlusion. No atheromatous narrowing about the left carotid bifurcation. Vertebral arteries: Left vertebral artery arises separately from the aortic arch. Vertebral arteries widely patent within the neck without stenosis, dissection, or occlusion. Skeleton: No acute osseus abnormality. No worrisome lytic or blastic osseous lesions. Mild degenerative disc bulge at C6-7 without significant stenosis. Other neck: No acute soft tissue abnormality within the neck. Salivary glands normal. Thyroid normal. No adenopathy. Upper chest: Visualized upper chest within normal limits. Partially visualized lungs are clear. Review of the MIP images confirms the above findings CTA HEAD FINDINGS Anterior circulation: Internal carotid arteries  widely patent to the termini without stenosis. Right A1 patent. Absent/hypoplastic left A1, accounting for the diminutive left ICA is compared to the right. Normal anterior communicating artery. Anterior cerebral arteries widely patent to their distal aspects without stenosis. No M1 stenosis  or occlusion. No proximal M2 occlusion. Distal MCA branches well perfused and symmetric. Posterior circulation: Vertebral arteries widely patent to the vertebrobasilar junction without stenosis. Posterior inferior cerebral arteries patent bilaterally. Basilar artery widely patent to its distal aspect. Superior cerebral arteries patent bilaterally. Both of the posterior cerebral artery supplied via the basilar and are well perfused to their distal aspects without stenosis. Venous sinuses: Patent. Anatomic variants: Hypoplastic/absent left A1 with the anterior cerebral artery supplied via the right carotid artery system. No aneurysm. Delayed phase: Not performed. Review of the MIP images confirms the above findings IMPRESSION: Normal CTA of the head and neck with no evidence for emergent large vessel occlusion. No significant atheromatous disease for patient age. No hemodynamically significant or correctable stenosis. These results were communicated to Rory Percy at 3:28 amon 5/21/2019by text page via the [facility] messaging system. Electronically Signed   By: Jeannine Boga M.D.   On: 06/05/2017 03:43   Mr Brain Wo Contrast  Result Date: 06/06/2017 CLINICAL DATA:  LEFT-sided weakness.  Follow-up stroke. EXAM: MRI HEAD WITHOUT CONTRAST TECHNIQUE: Multiplanar, multiecho pulse sequences of the brain and surrounding structures were obtained without intravenous contrast. COMPARISON:  CT HEAD Jun 05, 2017 and CTA HEAD and neck Jun 05, 2017. FINDINGS: INTRACRANIAL CONTENTS: Crescentic reduced diffusion RIGHT basal ganglia/corona radiata and subcentimeter foci of reduced diffusion RIGHT occipital lobe. Areas of diffusion abnormality  demonstrate low ADC values. No susceptibility artifact to suggest hemorrhage. Ventricles and sulci are normal for patient's age. A few punctate supratentorial white matter FLAIR T2 hyperintensities exclusive of aforementioned abnormality, normal for age. No midline shift, mass effect or masses. No abnormal extra-axial fluid collections. VASCULAR: Normal major intracranial vascular flow voids present at skull base. SKULL AND UPPER CERVICAL SPINE: No abnormal sellar expansion. No suspicious calvarial bone marrow signal. Craniocervical junction maintained. SINUSES/ORBITS: Trace paranasal sinus mucosal thickening with possible RIGHT maxillary mucosal retention cyst.The included ocular globes and orbital contents are non-suspicious. OTHER: None. IMPRESSION: 1. Acute RIGHT basal ganglia/corona radiata infarct. Tiny RIGHT acute occipital posterior watershed territory infarcts. 2. Otherwise negative noncontrast MRI of the head for age. Electronically Signed   By: Elon Alas M.D.   On: 06/06/2017 03:31   Dg Chest Port 1 View  Result Date: 06/05/2017 CLINICAL DATA:  Stroke.  Left-sided weakness. EXAM: PORTABLE CHEST 1 VIEW COMPARISON:  None. FINDINGS: Mild cardiomegaly. Mild aortic tortuosity, otherwise normal mediastinal contours. No pulmonary edema. No focal airspace disease, pleural effusion or pneumothorax. No acute osseous abnormalities. IMPRESSION: Mild cardiomegaly and tortuous thoracic aorta. No acute chest process. Electronically Signed   By: Jeb Levering M.D.   On: 06/05/2017 03:33   Ct Head Code Stroke Wo Contrast  Result Date: 06/05/2017 CLINICAL DATA:  Code stroke. Initial evaluation for acute left-sided weakness. EXAM: CT HEAD WITHOUT CONTRAST TECHNIQUE: Contiguous axial images were obtained from the base of the skull through the vertex without intravenous contrast. COMPARISON:  None. FINDINGS: Brain: Cerebral volume within normal limits. No acute intracranial hemorrhage. No acute large vessel  territory infarct. No mass lesion, midline shift or mass effect. No hydrocephalus. No extra-axial fluid collection. Vascular: No asymmetric hyperdense vessel. Skull: Scalp soft tissues and calvarium within normal limits. Sinuses/Orbits: Globes and orbital soft tissues within normal limits. Scattered mucosal thickening throughout the ethmoidal air cells and maxillary sinuses with probable mucocele at the left ethmoidal air cells. Mastoid air cells are clear. Other: None. ASPECTS [facility] Stroke Program Early CT Score) - Ganglionic level infarction (caudate, lentiform nuclei, internal capsule, insula, M1-M3  cortex): 7 - Supraganglionic infarction (M4-M6 cortex): 3 Total score (0-10 with 10 being normal): 10 IMPRESSION: 1. No acute intracranial infarct or other abnormality identified. 2. ASPECTS is 10. These results were communicated to Rory Percy at 3:10 amon 5/21/2019by text page via the [facility] messaging system. Electronically Signed   By: Jeannine Boga M.D.   On: 06/05/2017 03:12    Assessment/Plan: Diagnosis: Right basal ganglia/Corona Radiata infarct, thrombotic 1. Does the need for close, 24 hr/day medical supervision in concert with the patient's rehab needs make it unreasonable for this patient to be served in a less intensive setting? Yes 2. Co-Morbidities requiring supervision/potential complications: Uncontrolled hypertension 3. Due to bladder management, bowel management, safety, disease management, medication administration and patient education, does the patient require 24 hr/day rehab nursing? Yes 4. Does the patient require coordinated care of a physician, rehab nurse, PT (1-2 hrs/day, 5 days/week) and OT (1-2 hrs/day, 5 days/week) to address physical and functional deficits in the context of the above medical diagnosis(es)? Yes Addressing deficits in the following areas: balance, endurance, locomotion, strength, transferring, bathing, dressing, toileting and psychosocial support 5. Can the  patient actively participate in an intensive therapy program of at least 3 hrs of therapy per day at least 5 days per week? Yes 6. The potential for patient to make measurable gains while on inpatient rehab is excellent 7. Anticipated functional outcomes upon discharge from inpatient rehab are modified independent  with PT, modified independent with OT, n/a with SLP. 8. Estimated rehab length of stay to reach the above functional goals is: 7-d 9. Anticipated D/C setting: Home 10. Anticipated post D/C treatments: Outpatient therapy 11. Overall Rehab/Functional Prognosis: excellent  RECOMMENDATIONS: This patient's condition is appropriate for continued rehabilitative care in the following setting: CIR once stroke workup completed and pt is off nicardipine Patient has agreed to participate in recommended program. Yes Note that insurance prior authorization may be required for reimbursement for recommended care.  Comment: Need to monitor BP off nicardipine to see if pt can be managed off IV meds  "I have personally performed a face to face diagnostic evaluation of this patient.  Additionally, I have reviewed and concur with the physician assistant's documentation above." Charlett Blake M.D. [facility] FAAPM&R (Sports Med, Neuromuscular Med) Diplomate Am Board of Electrodiagnostic Med  Elizabeth Sauer 06/06/2017

## 2017-06-06 NOTE — Progress Notes (Signed)
STROKE TEAM PROGRESS NOTE   SUBJECTIVE (INTERVAL HISTORY) His wife is at the bedside. Pt is sitting in chair. His left hemiparesis continues to improve. BP still on the high end, will increase lisinopril dose. CIR pending.    OBJECTIVE Temp:  [97.5 F (36.4 C)-98.4 F (36.9 C)] 98 F (36.7 C) (05/22 1200) Pulse Rate:  [56-78] 78 (05/22 1000) Cardiac Rhythm: Normal sinus rhythm (05/22 0800) Resp:  [11-26] 18 (05/22 1000) BP: (113-153)/(78-107) 145/107 (05/22 1000) SpO2:  [95 %-100 %] 96 % (05/22 1000)  Recent Labs  Lab 06/05/17 0238  GLUCAP 97   Recent Labs  Lab 06/05/17 0236 06/05/17 0249  NA 139 141  K 3.8 3.7  CL 108 105  CO2 22  --   GLUCOSE 100* 99  BUN 9 9  CREATININE 0.87 0.80  CALCIUM 9.0  --    Recent Labs  Lab 06/05/17 0236  AST 22  ALT 20  ALKPHOS 82  BILITOT 1.2  PROT 7.0  ALBUMIN 3.8   Recent Labs  Lab 06/05/17 0236 06/05/17 0249  WBC 5.6  --   NEUTROABS 2.0  --   HGB 13.8 14.3  HCT 42.0 42.0  MCV 85.0  --   PLT 284  --    No results for input(s): CKTOTAL, CKMB, CKMBINDEX, TROPONINI in the last 168 hours. Recent Labs    06/05/17 0236  LABPROT 13.5  INR 1.04   Recent Labs    06/05/17 0359  COLORURINE COLORLESS*  LABSPEC 1.010  PHURINE 8.0  GLUCOSEU NEGATIVE  HGBUR NEGATIVE  BILIRUBINUR NEGATIVE  KETONESUR NEGATIVE  PROTEINUR NEGATIVE  NITRITE NEGATIVE  LEUKOCYTESUR NEGATIVE       Component Value Date/Time   CHOL 157 06/05/2017 0401   CHOL 172 08/05/2016 1531   TRIG 195 (H) 06/05/2017 0401   HDL 31 (L) 06/05/2017 0401   HDL 30 (L) 08/05/2016 1531   CHOLHDL 5.1 06/05/2017 0401   VLDL 39 06/05/2017 0401   LDLCALC 87 06/05/2017 0401   LDLCALC 76 08/05/2016 1531   Lab Results  Component Value Date   HGBA1C 5.6 06/05/2017      Component Value Date/Time   LABOPIA NONE DETECTED 06/05/2017 0359   COCAINSCRNUR NONE DETECTED 06/05/2017 0359   LABBENZ NONE DETECTED 06/05/2017 0359   AMPHETMU NONE DETECTED 06/05/2017 0359    THCU NONE DETECTED 06/05/2017 0359   LABBARB NONE DETECTED 06/05/2017 0359    Recent Labs  Lab 06/05/17 0236  ETH <10    I have personally reviewed the radiological images below and agree with the radiology interpretations.  Ct Angio Head W Or Wo Contrast  Result Date: 06/05/2017 CLINICAL DATA:  Initial evaluation for acute left-sided weakness. EXAM: CT ANGIOGRAPHY HEAD AND NECK TECHNIQUE: Multidetector CT imaging of the head and neck was performed using the standard protocol during bolus administration of intravenous contrast. Multiplanar CT image reconstructions and MIPs were obtained to evaluate the vascular anatomy. Carotid stenosis measurements (when applicable) are obtained utilizing NASCET criteria, using the distal internal carotid diameter as the denominator. CONTRAST:  60mL ISOVUE-370 IOPAMIDOL (ISOVUE-370) INJECTION 76% COMPARISON:  Prior CT performed earlier the same day. FINDINGS: CTA NECK FINDINGS Aortic arch: Visualized aortic arch of normal caliber with normal branch pattern. No flow-limiting stenosis about the origin of the great vessels. Visualized subclavian arteries widely patent. Right carotid system: Right common and internal carotid arteries widely patent without stenosis, dissection, or occlusion. No atheromatous narrowing about the right carotid bifurcation. Left carotid system: Left common and internal  carotid arteries widely patent without stenosis, dissection, or occlusion. No atheromatous narrowing about the left carotid bifurcation. Vertebral arteries: Left vertebral artery arises separately from the aortic arch. Vertebral arteries widely patent within the neck without stenosis, dissection, or occlusion. Skeleton: No acute osseus abnormality. No worrisome lytic or blastic osseous lesions. Mild degenerative disc bulge at C6-7 without significant stenosis. Other neck: No acute soft tissue abnormality within the neck. Salivary glands normal. Thyroid normal. No adenopathy.  Upper chest: Visualized upper chest within normal limits. Partially visualized lungs are clear. Review of the MIP images confirms the above findings CTA HEAD FINDINGS Anterior circulation: Internal carotid arteries widely patent to the termini without stenosis. Right A1 patent. Absent/hypoplastic left A1, accounting for the diminutive left ICA is compared to the right. Normal anterior communicating artery. Anterior cerebral arteries widely patent to their distal aspects without stenosis. No M1 stenosis or occlusion. No proximal M2 occlusion. Distal MCA branches well perfused and symmetric. Posterior circulation: Vertebral arteries widely patent to the vertebrobasilar junction without stenosis. Posterior inferior cerebral arteries patent bilaterally. Basilar artery widely patent to its distal aspect. Superior cerebral arteries patent bilaterally. Both of the posterior cerebral artery supplied via the basilar and are well perfused to their distal aspects without stenosis. Venous sinuses: Patent. Anatomic variants: Hypoplastic/absent left A1 with the anterior cerebral artery supplied via the right carotid artery system. No aneurysm. Delayed phase: Not performed. Review of the MIP images confirms the above findings IMPRESSION: Normal CTA of the head and neck with no evidence for emergent large vessel occlusion. No significant atheromatous disease for patient age. No hemodynamically significant or correctable stenosis. These results were communicated to Rory Percy at 3:28 amon 5/21/2019by text page via the [facility] messaging system. Electronically Signed   By: Jeannine Boga M.D.   On: 06/05/2017 03:43   Ct Angio Neck W Or Wo Contrast  Result Date: 06/05/2017 CLINICAL DATA:  Initial evaluation for acute left-sided weakness. EXAM: CT ANGIOGRAPHY HEAD AND NECK TECHNIQUE: Multidetector CT imaging of the head and neck was performed using the standard protocol during bolus administration of intravenous contrast. Multiplanar  CT image reconstructions and MIPs were obtained to evaluate the vascular anatomy. Carotid stenosis measurements (when applicable) are obtained utilizing NASCET criteria, using the distal internal carotid diameter as the denominator. CONTRAST:  42mL ISOVUE-370 IOPAMIDOL (ISOVUE-370) INJECTION 76% COMPARISON:  Prior CT performed earlier the same day. FINDINGS: CTA NECK FINDINGS Aortic arch: Visualized aortic arch of normal caliber with normal branch pattern. No flow-limiting stenosis about the origin of the great vessels. Visualized subclavian arteries widely patent. Right carotid system: Right common and internal carotid arteries widely patent without stenosis, dissection, or occlusion. No atheromatous narrowing about the right carotid bifurcation. Left carotid system: Left common and internal carotid arteries widely patent without stenosis, dissection, or occlusion. No atheromatous narrowing about the left carotid bifurcation. Vertebral arteries: Left vertebral artery arises separately from the aortic arch. Vertebral arteries widely patent within the neck without stenosis, dissection, or occlusion. Skeleton: No acute osseus abnormality. No worrisome lytic or blastic osseous lesions. Mild degenerative disc bulge at C6-7 without significant stenosis. Other neck: No acute soft tissue abnormality within the neck. Salivary glands normal. Thyroid normal. No adenopathy. Upper chest: Visualized upper chest within normal limits. Partially visualized lungs are clear. Review of the MIP images confirms the above findings CTA HEAD FINDINGS Anterior circulation: Internal carotid arteries widely patent to the termini without stenosis. Right A1 patent. Absent/hypoplastic left A1, accounting for the diminutive left ICA is compared  to the right. Normal anterior communicating artery. Anterior cerebral arteries widely patent to their distal aspects without stenosis. No M1 stenosis or occlusion. No proximal M2 occlusion. Distal MCA  branches well perfused and symmetric. Posterior circulation: Vertebral arteries widely patent to the vertebrobasilar junction without stenosis. Posterior inferior cerebral arteries patent bilaterally. Basilar artery widely patent to its distal aspect. Superior cerebral arteries patent bilaterally. Both of the posterior cerebral artery supplied via the basilar and are well perfused to their distal aspects without stenosis. Venous sinuses: Patent. Anatomic variants: Hypoplastic/absent left A1 with the anterior cerebral artery supplied via the right carotid artery system. No aneurysm. Delayed phase: Not performed. Review of the MIP images confirms the above findings IMPRESSION: Normal CTA of the head and neck with no evidence for emergent large vessel occlusion. No significant atheromatous disease for patient age. No hemodynamically significant or correctable stenosis. These results were communicated to Rory Percy at 3:28 amon 5/21/2019by text page via the [facility] messaging system. Electronically Signed   By: Jeannine Boga M.D.   On: 06/05/2017 03:43   Dg Chest Port 1 View  Result Date: 06/05/2017 CLINICAL DATA:  Stroke.  Left-sided weakness. EXAM: PORTABLE CHEST 1 VIEW COMPARISON:  None. FINDINGS: Mild cardiomegaly. Mild aortic tortuosity, otherwise normal mediastinal contours. No pulmonary edema. No focal airspace disease, pleural effusion or pneumothorax. No acute osseous abnormalities. IMPRESSION: Mild cardiomegaly and tortuous thoracic aorta. No acute chest process. Electronically Signed   By: Jeb Levering M.D.   On: 06/05/2017 03:33   Ct Head Code Stroke Wo Contrast  Result Date: 06/05/2017 CLINICAL DATA:  Code stroke. Initial evaluation for acute left-sided weakness. EXAM: CT HEAD WITHOUT CONTRAST TECHNIQUE: Contiguous axial images were obtained from the base of the skull through the vertex without intravenous contrast. COMPARISON:  None. FINDINGS: Brain: Cerebral volume within normal limits. No  acute intracranial hemorrhage. No acute large vessel territory infarct. No mass lesion, midline shift or mass effect. No hydrocephalus. No extra-axial fluid collection. Vascular: No asymmetric hyperdense vessel. Skull: Scalp soft tissues and calvarium within normal limits. Sinuses/Orbits: Globes and orbital soft tissues within normal limits. Scattered mucosal thickening throughout the ethmoidal air cells and maxillary sinuses with probable mucocele at the left ethmoidal air cells. Mastoid air cells are clear. Other: None. ASPECTS [facility] Stroke Program Early CT Score) - Ganglionic level infarction (caudate, lentiform nuclei, internal capsule, insula, M1-M3 cortex): 7 - Supraganglionic infarction (M4-M6 cortex): 3 Total score (0-10 with 10 being normal): 10 IMPRESSION: 1. No acute intracranial infarct or other abnormality identified. 2. ASPECTS is 10. These results were communicated to Rory Percy at 3:10 amon 5/21/2019by text page via the [facility] messaging system. Electronically Signed   By: Jeannine Boga M.D.   On: 06/05/2017 03:12   Mr Brain Wo Contrast  Result Date: 06/06/2017 CLINICAL DATA:  LEFT-sided weakness.  Follow-up stroke. EXAM: MRI HEAD WITHOUT CONTRAST TECHNIQUE: Multiplanar, multiecho pulse sequences of the brain and surrounding structures were obtained without intravenous contrast. COMPARISON:  CT HEAD Jun 05, 2017 and CTA HEAD and neck Jun 05, 2017. FINDINGS: INTRACRANIAL CONTENTS: Crescentic reduced diffusion RIGHT basal ganglia/corona radiata and subcentimeter foci of reduced diffusion RIGHT occipital lobe. Areas of diffusion abnormality demonstrate low ADC values. No susceptibility artifact to suggest hemorrhage. Ventricles and sulci are normal for patient's age. A few punctate supratentorial white matter FLAIR T2 hyperintensities exclusive of aforementioned abnormality, normal for age. No midline shift, mass effect or masses. No abnormal extra-axial fluid collections. VASCULAR: Normal major  intracranial vascular flow voids present at  skull base. SKULL AND UPPER CERVICAL SPINE: No abnormal sellar expansion. No suspicious calvarial bone marrow signal. Craniocervical junction maintained. SINUSES/ORBITS: Trace paranasal sinus mucosal thickening with possible RIGHT maxillary mucosal retention cyst.The included ocular globes and orbital contents are non-suspicious. OTHER: None. IMPRESSION: 1. Acute RIGHT basal ganglia/corona radiata infarct. Tiny RIGHT acute occipital posterior watershed territory infarcts. 2. Otherwise negative noncontrast MRI of the head for age. Electronically Signed   By: Elon Alas M.D.   On: 06/06/2017 03:31   TTE - Left ventricle: The cavity size was normal. Wall thickness was   normal. Systolic function was normal. The estimated ejection   fraction was in the range of 60% to 65%. Wall motion was normal;   there were no regional wall motion abnormalities. Doppler   parameters are consistent with abnormal left ventricular   relaxation (grade 1 diastolic dysfunction). - Aortic valve: There was mild to moderate regurgitation. - Pulmonary arteries: Systolic pressure was mildly increased. PA   peak pressure: 31 mm Hg (S). Impressions: - No cardiac source of emboli was indentified.   PHYSICAL EXAM  Temp:  [97.5 F (36.4 C)-98.4 F (36.9 C)] 98 F (36.7 C) (05/22 1200) Pulse Rate:  [56-78] 78 (05/22 1000) Resp:  [11-26] 18 (05/22 1000) BP: (113-153)/(78-107) 145/107 (05/22 1000) SpO2:  [95 %-100 %] 96 % (05/22 1000)  General - Well nourished, well developed, in no apparent distress.  Ophthalmologic - fundi not visualized due to noncooperation.  Cardiovascular - Regular rate and rhythm.  Mental Status -  Level of arousal and orientation to time, place, and person were intact. Language including expression, naming, repetition, comprehension was assessed and found intact. Attention span and concentration were normal. Fund of Knowledge was assessed and  was intact.  Cranial Nerves II - XII - II - Visual field intact OU. III, IV, VI - Extraocular movements intact. V - Facial sensation decreased on the left, 90% of the right. VII - Facial movement intact bilaterally. VIII - Hearing & vestibular intact bilaterally. X - Palate elevates symmetrically. XI - Chin turning & shoulder shrug intact bilaterally. XII - Tongue protrusion intact.  Motor Strength - The patient's strength was normal in RUE and RLE, but left UE 4-/5 proximal, 4/5 distal, left LE proximal and distal 4/5.  Bulk was normal and fasciculations were absent.   Motor Tone - Muscle tone was assessed at the neck and appendages and was normal.  Reflexes - The patient's reflexes were symmetrical in all extremities and he had no pathological reflexes.  Sensory - Light touch, temperature/pinprick were assessed and were decreased on the left, 90% of the right.    Coordination - The patient had normal movements in the right hand and foot with no ataxia or dysmetria.  Tremor was absent.  Gait and Station - not tested due to weakness.   ASSESSMENT/PLAN [name] is a 57 y.o. male with history of HTN and HLD admitted for left arm and leg weakness. TPA given.    Stroke - right BG/CR infarct, may be small vessel disease source   Resultant left hemiparesis   MRI right BG/CR infarct  CTA head and neck unremarkable  2D Echo EF 60-65%  LDL 87  HgbA1c 5.6  UDS neg  SCDs for VTE prophylaxis  No antithrombotic prior to admission, now on No antithrombotic within tPA 24h.  Ongoing aggressive stroke risk factor management  Therapy recommendations:  Pending   Disposition:  pending  Hypertension On the high end Increase lisinopril home  dose 20 daily to 20mg  bid  Long term BP goal normotensive  Hyperlipidemia  Home meds:  none   LDL 87, goal < 70  Now on lipitor 20  Continue statin at discharge  Other Stroke Risk Factors    Other Active  Problems  Elevated TG  Left sciatica - continue neurontin   BPH - continue flomax   Transfer to floor  Hospital day # 1   Rosalin Hawking, MD PhD Stroke Neurology 06/06/2017 1:37 PM    To contact Stroke Continuity provider, please refer to [email or web address] After hours, contact General Neurology

## 2017-06-06 NOTE — Progress Notes (Signed)
Inpatient Rehabilitation-Admissions Coordinator    Met with patient at the bedside to discuss team's recommendation for inpatient rehabilitation. Shared booklets, expectations while in CIR, expected length of stay, and anticipated functional level at DC. Provided letter of benefits as pt inquiring about insurance coverage. Pt plans to discuss the presented information with his wife tonight. Plan to follow up with pt for his dispo preference, timing of medical readiness, and IP Rehab bed availability. Process for insurance authorization has been initiated. Call if questions.   Jhonnie Garner, OTR/L  Rehab Admissions Coordinator  [phone] 06/06/2017 5:10 PM

## 2017-06-06 NOTE — Progress Notes (Signed)
Physical Therapy Treatment Patient Details Name: Robert Sandoval MRN: [id number] DOB: [birth year] Today's Date: 06/06/2017    History of Present Illness  Robert Sandoval is a 57 y.o. male past medical history of hypertension admitted as an acute code stroke for left-sided weakness.  Underwent TPA administration.     PT Comments    Pt much improved today. Pt with improved L LE strength and coordination. Pt able to tolerate ambulation with minAx2 with RW x 60' x2 trials. Pt given HEP and demo's great understanding. Pt demos excellent rehab potential and will greatly benefit from inpatient rehab upon d/c to achieve maximal functional return.   Follow Up Recommendations  CIR     Equipment Recommendations  Other (comment)(TBD at next venue)    Recommendations for Other Services Rehab consult     Precautions / Restrictions Precautions Precautions: Fall Precaution Comments: BP< 180/105 Restrictions Weight Bearing Restrictions: No    Mobility  Bed Mobility Overal bed mobility: Needs Assistance Bed Mobility: Supine to Sit     Supine to sit: Min assist;HOB elevated     General bed mobility comments: with v/c's pt able to move L LE to EOB with active quad set at knee. v/c's and minA for L UE use due to impaired coordination/strength  Transfers Overall transfer level: Needs assistance Equipment used: Rolling walker (2 wheeled) Transfers: Sit to/from Stand Sit to Stand: Min guard         General transfer comment: v/cs to achieve terminal L knee flexion. pt able to activally complete quad set and demo'd no buckling  Ambulation/Gait Ambulation/Gait assistance: Min assist;+2 safety/equipment Ambulation Distance (Feet): 60 Feet(x2) Assistive device: Rolling walker (2 wheeled) Gait Pattern/deviations: Step-to pattern;Decreased stride length;Decreased step length - right;Decreased weight shift to left;Narrow base of support Gait velocity: slow Gait velocity interpretation: <1.8 ft/sec,  indicate of risk for recurrent falls General Gait Details: v/c's to complete L quad set to prevent buckling when advancing the R LE. Pt with only 1 episode of L knee buckling but able to catch self, most likely due to fatigue as it was at the end. minA at L UE on walker due to weak grip and hand rotating off with fatigue. Pt began to have step through pattern towards the end of the walk as well    Stairs             Wheelchair Mobility    Modified Rankin (Stroke Patients Only) Modified Rankin (Stroke Patients Only) Pre-Morbid Rankin Score: No symptoms Modified Rankin: Moderately severe disability     Balance Overall balance assessment: Needs assistance Sitting-balance support: No upper extremity supported Sitting balance-Leahy Scale: Good     Standing balance support: Bilateral upper extremity supported Standing balance-Leahy Scale: Poor Standing balance comment: UE support for balance                            Cognition Arousal/Alertness: Awake/alert Behavior During Therapy: WFL for tasks assessed/performed Overall Cognitive Status: Within Functional Limits for tasks assessed                                        Exercises General Exercises - Lower Extremity Ankle Circles/Pumps: AROM;Left;10 reps;Supine Quad Sets: AROM;Left;10 reps;Supine(with 5 sec hold) Long Arc Quad: AROM;Left;10 reps;Seated(with 5 sec hold) Hip Flexion/Marching: AROM;Left;10 reps;Seated(with emphasis on slow and controlled)    General Comments  Pertinent Vitals/Pain Pain Assessment: No/denies pain    Home Living                      Prior Function            PT Goals (current goals can now be found in the care plan section) Acute Rehab PT Goals Patient Stated Goal: return to independent Progress towards PT goals: Progressing toward goals    Frequency    Min 4X/week      PT Plan Current plan remains appropriate    Co-evaluation               AM-PAC PT "6 Clicks" Daily Activity  Outcome Measure  Difficulty turning over in bed (including adjusting bedclothes, sheets and blankets)?: A Little Difficulty moving from lying on back to sitting on the side of the bed? : A Little Difficulty sitting down on and standing up from a chair with arms (e.g., wheelchair, bedside commode, etc,.)?: Unable Help needed moving to and from a bed to chair (including a wheelchair)?: A Little Help needed walking in hospital room?: A Little Help needed climbing 3-5 steps with a railing? : A Lot 6 Click Score: 15    End of Session Equipment Utilized During Treatment: Gait belt Activity Tolerance: Patient tolerated treatment well Patient left: in chair;with call bell/phone within reach;with chair alarm set;with family/visitor present Nurse Communication: Mobility status PT Visit Diagnosis: Other abnormalities of gait and mobility (R26.89);Other symptoms and signs involving the nervous system (R29.898);Hemiplegia and hemiparesis Hemiplegia - Right/Left: Left Hemiplegia - dominant/non-dominant: Non-dominant Hemiplegia - caused by: Cerebral infarction     Time: 6063-0160 PT Time Calculation (min) (ACUTE ONLY): 29 min  Charges:  $Gait Training: 8-22 mins $Therapeutic Exercise: 8-22 mins                    G Codes:       Kittie Plater, PT, DPT Pager #: [phone] Office #: [phone]    [place] 06/06/2017, 10:44 AM

## 2017-06-06 NOTE — Progress Notes (Signed)
Patient transferred from the 4N to 3W RM 28. Patient alert and oriented x 4. Tele placed and was verified. Patient oriented to room and was made comfortable.

## 2017-06-07 ENCOUNTER — Inpatient Hospital Stay (HOSPITAL_COMMUNITY)
Admission: RE | Admit: 2017-06-07 | Discharge: 2017-06-13 | DRG: 057 | Disposition: A | Discharge status: Home / Self Care | Payer: Managed Care, Other (non HMO) | Source: Intra-hospital | Attending: Physical Medicine & Rehabilitation | Admitting: Physical Medicine & Rehabilitation

## 2017-06-07 ENCOUNTER — Encounter (HOSPITAL_COMMUNITY): Payer: Self-pay

## 2017-06-07 ENCOUNTER — Encounter (HOSPITAL_COMMUNITY): Payer: Self-pay | Admitting: Physical Medicine and Rehabilitation

## 2017-06-07 DIAGNOSIS — I69354 Hemiplegia and hemiparesis following cerebral infarction affecting left non-dominant side: Principal | ICD-10-CM

## 2017-06-07 DIAGNOSIS — M545 Low back pain: Secondary | ICD-10-CM | POA: Diagnosis present

## 2017-06-07 DIAGNOSIS — I69398 Other sequelae of cerebral infarction: Secondary | ICD-10-CM | POA: Diagnosis not present

## 2017-06-07 DIAGNOSIS — N400 Enlarged prostate without lower urinary tract symptoms: Secondary | ICD-10-CM

## 2017-06-07 DIAGNOSIS — M5410 Radiculopathy, site unspecified: Secondary | ICD-10-CM | POA: Diagnosis present

## 2017-06-07 DIAGNOSIS — I959 Hypotension, unspecified: Secondary | ICD-10-CM | POA: Diagnosis not present

## 2017-06-07 DIAGNOSIS — I10 Essential (primary) hypertension: Secondary | ICD-10-CM

## 2017-06-07 DIAGNOSIS — R269 Unspecified abnormalities of gait and mobility: Secondary | ICD-10-CM

## 2017-06-07 DIAGNOSIS — I6381 Other cerebral infarction due to occlusion or stenosis of small artery: Secondary | ICD-10-CM | POA: Insufficient documentation

## 2017-06-07 DIAGNOSIS — I639 Cerebral infarction, unspecified: Secondary | ICD-10-CM

## 2017-06-07 DIAGNOSIS — Z8349 Family history of other endocrine, nutritional and metabolic diseases: Secondary | ICD-10-CM

## 2017-06-07 DIAGNOSIS — R2689 Other abnormalities of gait and mobility: Secondary | ICD-10-CM | POA: Diagnosis present

## 2017-06-07 DIAGNOSIS — G8114 Spastic hemiplegia affecting left nondominant side: Secondary | ICD-10-CM

## 2017-06-07 DIAGNOSIS — Z825 Family history of asthma and other chronic lower respiratory diseases: Secondary | ICD-10-CM | POA: Diagnosis not present

## 2017-06-07 DIAGNOSIS — K5900 Constipation, unspecified: Secondary | ICD-10-CM | POA: Diagnosis present

## 2017-06-07 DIAGNOSIS — E785 Hyperlipidemia, unspecified: Secondary | ICD-10-CM | POA: Diagnosis present

## 2017-06-07 DIAGNOSIS — Z79899 Other long term (current) drug therapy: Secondary | ICD-10-CM

## 2017-06-07 LAB — BASIC METABOLIC PANEL
Anion gap: 9 (ref 5–15)
BUN: 16 mg/dL (ref 6–20)
CALCIUM: 9.3 mg/dL (ref 8.9–10.3)
CO2: 22 mmol/L (ref 22–32)
CREATININE: 0.87 mg/dL (ref 0.61–1.24)
Chloride: 108 mmol/L (ref 101–111)
GFR calc Af Amer: >60 mL/min (ref >60)
Glucose, Bld: 107 mg/dL — ABNORMAL HIGH (ref 65–99)
POTASSIUM: 4.1 mmol/L (ref 3.5–5.1)
Sodium: 139 mmol/L (ref 135–145)

## 2017-06-07 LAB — CBC
HCT: 44.4 % (ref 39.0–52.0)
Hemoglobin: 14.6 g/dL (ref 13.0–17.0)
MCH: 28.3 pg (ref 26.0–34.0)
MCHC: 32.9 g/dL (ref 30.0–36.0)
MCV: 86 fL (ref 78.0–100.0)
PLATELETS: 282 10*3/uL (ref 150–400)
RBC: 5.16 MIL/uL (ref 4.22–5.81)
RDW: 13.1 % (ref 11.5–15.5)
WBC: 6.7 10*3/uL (ref 4.0–10.5)

## 2017-06-07 LAB — TSH: TSH: 2.683 u[IU]/mL (ref 0.350–4.500)

## 2017-06-07 LAB — VITAMIN B12: Vitamin B-12: 505 pg/mL (ref 180–914)

## 2017-06-07 MED ORDER — POLYETHYLENE GLYCOL 3350 17 G PO PACK: 17.0000 g | PACK | Freq: Every day | ORAL | 0 refills | Status: DC | PRN

## 2017-06-07 MED ORDER — LISINOPRIL 20 MG PO TABS
20.0000 mg | ORAL_TABLET | Freq: Two times a day (BID) | ORAL | Status: DC
  Administered 2017-06-07 – 2017-06-09 (×4): 20 mg via ORAL
  Filled 2017-06-07 (×4): qty 1

## 2017-06-07 MED ORDER — CLOPIDOGREL BISULFATE 75 MG PO TABS
75.0000 mg | ORAL_TABLET | Freq: Every day | ORAL | Status: DC
  Administered 2017-06-08 – 2017-06-13 (×6): 75 mg via ORAL
  Filled 2017-06-07 (×6): qty 1

## 2017-06-07 MED ORDER — SENNOSIDES-DOCUSATE SODIUM 8.6-50 MG PO TABS: 1.0000 | ORAL_TABLET | Freq: Every evening | ORAL | Status: DC | PRN

## 2017-06-07 MED ORDER — TRAZODONE HCL 50 MG PO TABS: 25.0000 mg | ORAL_TABLET | Freq: Every evening | ORAL | Status: DC | PRN

## 2017-06-07 MED ORDER — LISINOPRIL 20 MG PO TABS: 20.0000 mg | ORAL_TABLET | Freq: Two times a day (BID) | ORAL | Status: DC

## 2017-06-07 MED ORDER — GABAPENTIN 300 MG PO CAPS: 300.0000 mg | ORAL_CAPSULE | Freq: Three times a day (TID) | ORAL | Status: DC

## 2017-06-07 MED ORDER — FLEET ENEMA 7-19 GM/118ML RE ENEM: 1.0000 | ENEMA | Freq: Once | RECTAL | Status: DC | PRN

## 2017-06-07 MED ORDER — BISACODYL 10 MG RE SUPP: 10.0000 mg | Freq: Every day | RECTAL | Status: DC | PRN

## 2017-06-07 MED ORDER — ASPIRIN EC 81 MG PO TBEC
81.0000 mg | DELAYED_RELEASE_TABLET | Freq: Every day | ORAL | Status: DC
  Administered 2017-06-08 – 2017-06-13 (×6): 81 mg via ORAL
  Filled 2017-06-07 (×6): qty 1

## 2017-06-07 MED ORDER — CLOPIDOGREL BISULFATE 75 MG PO TABS: 75.0000 mg | ORAL_TABLET | Freq: Every day | ORAL | Status: DC

## 2017-06-07 MED ORDER — POLYETHYLENE GLYCOL 3350 17 G PO PACK
17.0000 g | PACK | Freq: Two times a day (BID) | ORAL | Status: DC
  Administered 2017-06-07 – 2017-06-13 (×12): 17 g via ORAL
  Filled 2017-06-07 (×11): qty 1

## 2017-06-07 MED ORDER — ASPIRIN 81 MG PO TBEC: 81.0000 mg | DELAYED_RELEASE_TABLET | Freq: Every day | ORAL | Status: DC

## 2017-06-07 MED ORDER — GABAPENTIN 300 MG PO CAPS
300.0000 mg | ORAL_CAPSULE | Freq: Three times a day (TID) | ORAL | Status: DC
  Administered 2017-06-07 – 2017-06-13 (×16): 300 mg via ORAL
  Filled 2017-06-07 (×16): qty 1

## 2017-06-07 MED ORDER — PROCHLORPERAZINE 25 MG RE SUPP: 12.5000 mg | Freq: Four times a day (QID) | RECTAL | Status: DC | PRN

## 2017-06-07 MED ORDER — FLUTICASONE PROPIONATE 50 MCG/ACT NA SUSP
1.0000 | Freq: Every day | NASAL | Status: DC
  Administered 2017-06-08 – 2017-06-13 (×6): 1 via NASAL
  Filled 2017-06-07: qty 16

## 2017-06-07 MED ORDER — PROCHLORPERAZINE EDISYLATE 10 MG/2ML IJ SOLN: 5.0000 mg | Freq: Four times a day (QID) | INTRAMUSCULAR | Status: DC | PRN

## 2017-06-07 MED ORDER — ACETAMINOPHEN 325 MG PO TABS
325.0000 mg | ORAL_TABLET | ORAL | Status: DC | PRN
  Administered 2017-06-13: 650 mg via ORAL
  Filled 2017-06-07: qty 2

## 2017-06-07 MED ORDER — LORATADINE 10 MG PO TABS
10.0000 mg | ORAL_TABLET | Freq: Every day | ORAL | Status: DC
  Administered 2017-06-07 – 2017-06-13 (×7): 10 mg via ORAL
  Filled 2017-06-07 (×7): qty 1

## 2017-06-07 MED ORDER — ALUM & MAG HYDROXIDE-SIMETH 200-200-20 MG/5ML PO SUSP: 30.0000 mL | ORAL | Status: DC | PRN

## 2017-06-07 MED ORDER — DIPHENHYDRAMINE HCL 12.5 MG/5ML PO ELIX: 12.5000 mg | ORAL_SOLUTION | Freq: Four times a day (QID) | ORAL | Status: DC | PRN

## 2017-06-07 MED ORDER — POLYETHYLENE GLYCOL 3350 17 G PO PACK
17.0000 g | PACK | Freq: Every day | ORAL | Status: DC | PRN
  Filled 2017-06-07: qty 1

## 2017-06-07 MED ORDER — ATORVASTATIN CALCIUM 20 MG PO TABS
20.0000 mg | ORAL_TABLET | Freq: Every day | ORAL | Status: DC
  Administered 2017-06-07 – 2017-06-12 (×6): 20 mg via ORAL
  Filled 2017-06-07 (×6): qty 1

## 2017-06-07 MED ORDER — PROCHLORPERAZINE MALEATE 5 MG PO TABS: 5.0000 mg | ORAL_TABLET | Freq: Four times a day (QID) | ORAL | Status: DC | PRN

## 2017-06-07 MED ORDER — GABAPENTIN 300 MG PO CAPS
300.0000 mg | ORAL_CAPSULE | Freq: Three times a day (TID) | ORAL | Status: DC
  Filled 2017-06-07: qty 1

## 2017-06-07 MED ORDER — GUAIFENESIN-DM 100-10 MG/5ML PO SYRP: 5.0000 mL | ORAL_SOLUTION | Freq: Four times a day (QID) | ORAL | Status: DC | PRN

## 2017-06-07 MED ORDER — ENOXAPARIN SODIUM 40 MG/0.4ML ~~LOC~~ SOLN
40.0000 mg | SUBCUTANEOUS | Status: DC
  Administered 2017-06-07 – 2017-06-12 (×6): 40 mg via SUBCUTANEOUS
  Filled 2017-06-07 (×6): qty 0.4

## 2017-06-07 MED ORDER — ATORVASTATIN CALCIUM 20 MG PO TABS: 20.0000 mg | ORAL_TABLET | Freq: Every day | ORAL | Status: DC

## 2017-06-07 MED ORDER — TAMSULOSIN HCL 0.4 MG PO CAPS
0.4000 mg | ORAL_CAPSULE | Freq: Every day | ORAL | Status: DC
  Administered 2017-06-08 – 2017-06-13 (×6): 0.4 mg via ORAL
  Filled 2017-06-07 (×6): qty 1

## 2017-06-07 NOTE — Progress Notes (Signed)
Inpatient Rehabilitation-Admissions Coordinator   Spoke with pt and wife at the bedside. Pt and wife wanting CIR. AC received medical approval and insurance authorization today. CIR bed available today and pt will be admitted today. Communicated plan to floor nurse, CM, and SW.   Jhonnie Garner, OTR/L  Rehab Admissions Coordinator  [phone] 06/07/2017 11:53 AM

## 2017-06-07 NOTE — IPOC Note (Signed)
Overall Plan of Care [facility]) Patient Details Name: Robert Sandoval MRN: [id number] DOB: 09-19-1960  Admitting Diagnosis: Acute ischemic stroke [facility])  Hospital Problems: Principal Problem:   Acute ischemic stroke ([place]) - R basal ganglia s/p IV tPA Active Problems:   Essential hypertension, benign   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 (HCC)   Gait disturbance, post-stroke   Hyperlipidemia   Basal ganglia infarction ([place])   Benign prostatic hyperplasia   Hemiparesis affecting left side as late effect of stroke ([place])     Functional Problem List: Nursing Pain, Bowel  PT Balance, Behavior, Endurance, Motor, Nutrition, Pain, Perception, Safety, Sensory, Skin Integrity  OT Balance, Endurance, Motor, Pain, Safety  SLP    TR         Basic ADL's: OT Bathing, Dressing, Toileting     Advanced  ADL's: OT Simple Meal Preparation     Transfers: PT Bed Mobility, Bed to Chair, Car, Furniture, Floor  OT Toilet, Metallurgist: PT Ambulation, Emergency planning/management officer, Stairs     Additional Impairments: OT Fuctional Use of Upper Extremity  SLP        TR      Anticipated Outcomes Item Anticipated Outcome  Self Feeding    Swallowing      Basic self-care  Mod I  Toileting  Mod I   Bathroom Transfers Mod I  Bowel/Bladder  cont x 2 LBM 05/20  Transfers  Mod I  Locomotion  Mod I  Communication     Cognition     Pain  less than 2  Safety/Judgment  to remain free of falls   Therapy Plan: PT Intensity: Minimum of 1-2 x/day ,45 to 90 minutes PT Frequency: 5 out of 7 days PT Duration Estimated Length of Stay: 7-9 days OT Intensity: Minimum of 1-2 x/day, 45 to 90 minutes OT Frequency: 5 out of 7 days OT Duration/Estimated Length of Stay: 7 days      Team Interventions: Nursing Interventions Patient/Family Education, Disease Management/Prevention, Discharge Planning, Pain Management, Bowel Management  PT interventions  Ambulation/gait training, Disease management/prevention, Pain management, Stair training, Visual/perceptual remediation/compensation, DME/adaptive equipment instruction, Training and development officer, Patient/family education, Wheelchair propulsion/positioning, Therapeutic Activities, Therapeutic Exercise, Psychosocial support, Cognitive remediation/compensation, Community reintegration, Functional mobility training, UE/LE Strength taining/ROM, Neuromuscular re-education, Splinting/orthotics, Discharge planning, UE/LE Coordination activities  OT Interventions Training and development officer, Community reintegration, Discharge planning, Disease mangement/prevention, Engineer, drilling, Functional mobility training, Neuromuscular re-education, Pain management, Patient/family education, Psychosocial support, Self Care/advanced ADL retraining, Therapeutic Activities, Therapeutic Exercise, UE/LE Strength taining/ROM, UE/LE Coordination activities  SLP Interventions    TR Interventions    SW/CM Interventions Discharge Planning, Patient/Family Education, Psychosocial Support   Barriers to Discharge MD  Medical stability  Nursing      PT      OT      SLP      SW       Team Discharge Planning: Destination: PT-Home ,OT- Home , SLP-  Projected Follow-up: PT-Home health PT, OT-  Outpatient OT, SLP-  Projected Equipment Needs: PT- , OT- To be determined, SLP-  Equipment Details: PT-tbd, OT-  Patient/family involved in discharge planning: PT- Patient,  OT-Patient, SLP-   MD ELOS: 4-7 days. Medical Rehab Prognosis:  Good Assessment: 42 year oldright handedmaleof [place] descentwith history of HTN, hyperlipidemia, LBP with LLE radiculopathy;who was admitted on 06/05/17 with left sided weakness and elevated SBP 190s. CT head negative and he receive tPA. CTA head/neck done was negative for large vessel  occlusion. UDS negative. MRI brain done revealing acute right basal ganglia/CR infarct  and acute tiny right occipital infarct in watershed territory. 2D echo showed EF 60-65% with no wall abnormality and mild to moderate AVR. Dr. Erlinda Hong felt that stroke due to small vessel disease --DAPT added with recommendations for aggressive risk factor management. Lisinopril increased to bid for tighter control and lipitor added with LDL goal <70. Patient with functional deficits due to left sided weakness with sensory deficits. Will set goals for Mod I with PT/OT.  See Team Conference Notes for weekly updates to the plan of care

## 2017-06-07 NOTE — Progress Notes (Signed)
PMR Admission Coordinator Pre-Admission Assessment  Patient: Robert Sandoval is an 57 y.o., male MRN: [id number] DOB: Mar 15, 1960 Height: 5\' 8"  (172.7 cm) Weight: 72.2 kg (159 lb 2.8 oz)                                                                                                                                                  Insurance Information HMO:     PPO:      PCP:      IPA:      80/20:      OTHER:  PRIMARY: [employer]    Policy#: [id number]      Subscriber: Self CM Name: Robert Sandoval      Phone#: [phone]     Fax#: [phone] Pre-Cert#: [id number] Approved for 7 days with review due on May 30th     Employer:  Benefits:  Phone #: [phone]     Name:  Eff. Date: 01/16/17     Deduct: $2,000      Out of Pocket Max: $6,350      Life Max: NA CIR: Coverage at 75% with 25% co-pay      SNF: Coverage at 75% with 25% co-pay (120 day max/yr) Outpatient: Coverage at 75%     Co-Pay: 25%  (60 combined visit limit) Home Health: Coverage at 75%        Co-Pay: 25% (120 visit/yr) DME: Coverage at 75%     Co-Pay: 25% Providers: In-Network   Medicaid Application Date:       Case Manager:  Disability Application Date:       Case Worker:   Emergency Publishing copy Information    Name Relation Home Work Mobile   Sandoval,Robert Brother [phone]       Current Medical History  Patient Admitting Diagnosis: Right basal ganglia/Corona Radiata infarct, thrombotic  History of Present Illness: Robert Sandoval is a 57 year old right-handed male history of hypertension hyperlipidemia.Presented 06/05/2017 with acute onset of left-sided weakness. Initial blood pressures with systolics in the 355D. Cranial CT scan negative. Urine drug screen negative. Patient did receive TPA. CT angiogram of head and neck negative for emergent large vessel occlusion or stenosis. 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 Tiny right acute occipital posterior watershed territory  infarction. Echocardiogram with ejection fraction 32% grade 1 diastolic dysfunction. Cardene drip initiated for blood pressure control and lisinopril was adjusted. Neurology consulted maintained on aspirin and Plavix for CVA prophylaxis. Pt is off Cardene drip.Tolerating a regular diet. Physical and occupational therapy evaluations completed with recommendations of physical medicine rehab consult. Patient is to be admitted for a comprehensive rehab program on 06/07/17. .   Total: 3  Past Medical History      Past Medical History:  Diagnosis Date  . Allergy   . Hyperlipidemia   . Hypertension  Family History  family history includes Asthma in his father; Hyperlipidemia in his father.  Prior Rehab/Hospitalizations:  Has the patient had major surgery during 100 days prior to admission? No; of note pt did report dental work with use of local anesthesia in March   Current Medications   Current Facility-Administered Medications:  .   stroke: mapping our early stages of recovery book, , Does not apply, Once, Robert Hawking, MD .  acetaminophen (TYLENOL) tablet 650 mg, 650 mg, Oral, Q4H PRN, 650 mg at 06/06/17 1248 **OR** [DISCONTINUED] acetaminophen (TYLENOL) solution 650 mg, 650 mg, Per Tube, Q4H PRN **OR** [DISCONTINUED] acetaminophen (TYLENOL) suppository 650 mg, 650 mg, Rectal, Q4H PRN, Robert Portland, MD .  aspirin EC tablet 81 mg, 81 mg, Oral, Daily, Robert Hawking, MD, 81 mg at 06/06/17 1438 .  atorvastatin (LIPITOR) tablet 20 mg, 20 mg, Oral, q1800, Robert Hawking, MD, 20 mg at 06/06/17 1808 .  clopidogrel (PLAVIX) tablet 75 mg, 75 mg, Oral, Daily, Robert Hawking, MD, 75 mg at 06/06/17 1437 .  gabapentin (NEURONTIN) capsule 300 mg, 300 mg, Oral, TID PRN, Robert Hawking, MD .  lisinopril (PRINIVIL,ZESTRIL) tablet 20 mg, 20 mg, Oral, BID, Robert Hawking, MD, 20 mg at 06/06/17 1438 .  polyethylene glycol (MIRALAX / GLYCOLAX) packet 17 g, 17 g, Oral, Daily PRN, Robert Hawking, MD .   senna-docusate (Senokot-S) tablet 1 tablet, 1 tablet, Oral, QHS PRN, Robert Portland, MD .  tamsulosin [facility]) capsule 0.4 mg, 0.4 mg, Oral, Daily, Robert Hawking, MD, 0.4 mg at 06/06/17 1438  Patients Current Diet:       Diet Order           Diet regular Room service appropriate? 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  Independent  Indoor Mobility: Did the patient need assistance with walking from room to room (with or without device)? Independent  Stairs: Did the patient need assistance with internal or external stairs (with or without device)? Independent  Functional Cognition: Did the patient need help planning regular tasks such as shopping or remembering to take medications? Independent  Current Functional Level Cognition  Arousal/Alertness: Awake/alert Overall Cognitive Status: Within Functional Limits for tasks assessed Orientation Level: Oriented X4 Attention: Selective Selective Attention: Appears intact Memory: Appears intact Awareness: Appears intact Problem Solving: Appears intact    Extremity Assessment (includes Sensation/Coordination)  Upper Extremity Assessment: LUE  deficits/detail LUE Deficits / Details: AROM WFL with 3+/5 strength throughout (shoulder, elbow, wrist, grasp). Diminished coordination and proprioception noted.  LUE Sensation: decreased light touch, decreased proprioception LUE Coordination: decreased fine motor, decreased gross motor  Lower Extremity Assessment: Defer to PT evaluation LLE Deficits / Details: AAROM WFL, strength hip flexion 2+/5, knee extension 2+/5, ankle DF 3=/5 LLE Sensation: decreased light touch(proprioception NT)  ADLs  Overall ADL's : Needs assistance/impaired Eating/Feeding: Set up, Sitting Grooming: Minimal assistance, Standing Upper Body Bathing: Minimal assistance, Sitting Lower Body Bathing: Sit to/from stand, Moderate assistance Upper Body Dressing : Minimal assistance, Sitting Lower Body Dressing: Sit to/from stand, Moderate assistance Toilet Transfer: Ambulation, RW, Moderate assistance Toilet Transfer Details (indicate cue type and reason): Assist for balance and coordination Toileting- Clothing Manipulation and Hygiene: Minimal assistance, Sit to/from stand Functional mobility during ADLs: Rolling walker, Moderate assistance General ADL Comments: Pt requiring assistance for balance and coordination when standing. Limited by decreased LUE coordination and proprioception.     Mobility  Overal bed mobility: Needs Assistance Bed Mobility: Sit to Supine Supine to sit: Min assist, HOB elevated Sit to supine: Min guard General bed mobility comments: Cues for technique and min guard assist for safety.     Transfers  Overall transfer level: Needs assistance Equipment used: Rolling walker (2 wheeled) Transfers: Sit to/from Stand Sit to Stand: Min assist Stand pivot transfers: Mod assist, +2 safety/equipment General transfer comment: MIn assist to power up to full standing position. Mod assist while ambulating.     Ambulation / Gait / Stairs / Wheelchair Mobility   Ambulation/Gait Ambulation/Gait assistance: Min assist, +2 safety/equipment Ambulation Distance (Feet): 60 Feet(x2) Assistive device: Rolling walker (2 wheeled) Gait Pattern/deviations: Step-to pattern, Decreased stride length, Decreased step length - right, Decreased weight shift to left, Narrow base of support General Gait Details: v/c's to complete L quad set to prevent buckling when advancing the R LE. Pt with only 1 episode of L knee buckling but able to catch self, most likely due to fatigue as it was at the end. minA at L UE on walker due to weak grip and hand rotating off with fatigue. Pt began to have step through pattern towards the end of the walk as well  Gait velocity: slow Gait velocity interpretation: <1.8 ft/sec, indicate of risk for recurrent falls    Posture / Balance Balance Overall balance assessment: Needs assistance Sitting-balance support: No upper extremity supported Sitting balance-Leahy Scale: Good Standing balance support: Bilateral upper extremity supported Standing balance-Leahy Scale: Poor Standing balance comment: UE support for balance    Special needs/care consideration BiPAP/CPAP: No CPM: No Continuous Drip IV: No Dialysis: No       Days: NA Life Vest: No Oxygen: No Special Bed: No Trach Size: No Wound Vac (area): No      Location: NA Skin: No                              Location: NA Bowel mgmt: 5/19 (pt reports taking some type of powder daily for regularity) Bladder mgmt:Continent use of urinal.  Diabetic mgmt: NA     Previous Home Environment Living Arrangements: Spouse/significant other  Lives With: Spouse Available Help at Discharge: Family Type of Home: Apartment Home Layout: One level Home Access: Stairs to enter Entrance Stairs-Rails: Left Entrance Stairs-Number of Steps: 2 Bathroom Shower/Tub: Chiropodist: Standard Home Care Services: No  Discharge Living Setting Plans for Discharge Living Setting:  Patient's home, Lives with (comment), Apartment(lives with wife) Type of Home at Discharge: Apartment Discharge Home Layout: One level Discharge Home Access: Stairs to enter Entrance Stairs-Rails: Left Entrance Stairs-Number of Steps: 3 Discharge Bathroom Shower/Tub: Tub/shower unit(pt uses a Radio broadcast assistant inside his tub as a seat) Discharge Bathroom Toilet: Standard Discharge Bathroom Accessibility: Yes How Accessible: Accessible via walker Does the patient have any  problems obtaining your medications?: No  Social/Family/Support Systems Patient Roles: Spouse, Parent(pt works full time as Geophysicist/field seismologist; has grown son out of the home) Sport and exercise psychologist Information: wife: Jonelle Sidle; brother: Robert Anticipated Caregiver: Wife and Brother Anticipated Ambulance person Information: Wife: [phone]; Brother: [phone] Ability/Limitations of Caregiver: both can physically assist if needed Caregiver Availability: 24/7(wife and brother can split shifts) Discharge Plan Discussed with Primary Caregiver: Yes Is Caregiver In Agreement with Plan?: Yes Does Caregiver/Family have Issues with Lodging/Transportation while Pt is in Rehab?: No   Goals/Additional Needs Patient/Family Goal for Rehab: Mod I PT/OT SLP NA Expected length of stay: 7 Cultural Considerations: NA Dietary Needs: Soft food, thin liquids Equipment Needs: TBD Special Service Needs: NA Additional Information: NA Pt/Family Agrees to Admission and willing to participate: Yes; Of note pt speaks [place] well; pt's wife speaks Spanish and pt translates for her.  Program Orientation Provided & Reviewed with Pt/Caregiver Including Roles  & Responsibilities: Yes(reveiwed with pt) Additional Information Needs: NA  Barriers to Discharge: Home environment access/layout  Barriers to Discharge Comments: (3 steps to enter home)   Decrease burden of Care through IP rehab admission: NA    Possible need for SNF placement upon discharge:Not  anticipated.    Patient Condition: This patient's condition remains as documented in the consult dated 06/06/17, in which the Rehabilitation Physician determined and documented that the patient's condition is appropriate for intensive rehabilitative care in an inpatient rehabilitation facility. Will admit to inpatient rehab today.  Preadmission Screen Completed By:  Jhonnie Garner, 06/06/2017 6:13 PM ______________________________________________________________________   Discussed status with Dr. Letta Pate on 06/07/17 at 12:08AM and received telephone approval for admission today.  Admission Coordinator:  Jhonnie Garner, time 12:08AM/Date: 06/07/17             Cosigned by: Charlett Blake, MD at 06/07/2017 1:30 PM  Revision History

## 2017-06-07 NOTE — Progress Notes (Signed)
Pt arrived to unit via w/c with personal belongings. Pt A&O able to make needs known, lungs clear, BS positive in all four quads with LBM 05/20, PRN Miralax provided, SR up x 3, bed alarm on and functioning, call bell in reach.

## 2017-06-07 NOTE — Plan of Care (Signed)
Managing adls with min assist, wifes assistance.

## 2017-06-07 NOTE — H&P (Signed)
Physical Medicine and Rehabilitation Admission H&P        Chief Complaint  Patient presents with  . Stroke with functional deficits.       HPI: Robert Sandoval is a 57 year old right handed male of [place] descent with history of HTN, hyperlipidemia, LBP with LLE radiculopathy; who was admitted on 06/05/17 with left sided weakness and elevated SBP 190s. CT head negative and he receive tPA. CTA head/neck done was negative for large vessel occlusion. UDS negative. MRI brain done revealing acute right basal ganglia/CR infarct and acute tiny right occipital infarct in watershed territory.  2D echo showed EF 60-65% with no wall abnormality and mild to moderate AVR. Dr. 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  Lisinopril increased to bid for tighter control and lipitor added with LDL goal < 70.  Patient with functional deficits due to left sided weakness with sensory deficits. CIR recommended for follow up therapy.    Patient states that his wife assisted him in the shower today.   Review of Systems  Constitutional: Negative for chills and fever.  HENT: Negative for hearing loss and tinnitus.   Eyes: Negative for blurred vision and double vision.  Respiratory: Negative for cough and shortness of breath.   Cardiovascular: Negative for chest pain, palpitations and leg swelling.  Gastrointestinal: Positive for constipation. Negative for abdominal pain, heartburn and nausea.  Genitourinary: Negative for dysuria and urgency.  Musculoskeletal: Positive for back pain and myalgias.  Skin: Negative for itching and rash.  Neurological: Positive for sensory change (numbness LUE/LLE) and focal weakness.  Psychiatric/Behavioral: Negative for depression. The patient is not nervous/anxious.           Past Medical History:  Diagnosis Date  . Allergy    . BPH (benign prostatic hyperplasia)      followed by [facility]   . Hyperlipidemia    . Hypertension    . Low back pain radiating to left lower extremity      sees Dr. Tonita Cong      History reviewed. No pertinent surgical history.           Family History  Problem Relation Age of Onset  . Hyperlipidemia Father    . Asthma Father        Social History:  Married. (wife non-english speaking)  Works as a Dealer. He reports that he has never smoked. He has never used smokeless tobacco. He reports that he drinks 1-2 beers twice a month. He reports that he does not use drugs.      Allergies: No Known Allergies      Medications Prior to Admission  Medication Sig Dispense Refill  . gabapentin (NEURONTIN) 300 MG capsule Take 300 mg by mouth 3 (three) times daily as needed (for pain).       Marland Kitchen lisinopril (PRINIVIL,ZESTRIL) 20 MG tablet Take 1 tablet (20 mg total) by mouth daily. 90 tablet 3  . tamsulosin (FLOMAX) 0.4 MG CAPS capsule Take 0.4 mg by mouth daily.      . hydrocortisone (ANUSOL-HC) 2.5 % rectal cream Place 1 application rectally at bedtime. (Patient not taking: Reported on 06/05/2017) 30 g 0  . polyethylene glycol powder (GLYCOLAX/MIRALAX) powder Take 17 g by mouth daily. (Patient not taking: Reported on 06/05/2017) 3350 g 1      Drug Regimen Review  Drug regimen was reviewed and remains appropriate with no significant issues identified  Home: Home Living Family/patient expects to be discharged to:: Private residence Living Arrangements: Spouse/significant other Available Help at Discharge: Family Type of Home: Apartment Home Access: Stairs to enter Technical brewer of Steps: 2 Entrance Stairs-Rails: Left Home Layout: One level Bathroom Shower/Tub: Chiropodist: Standard Home Equipment: None  Lives With: Spouse   Functional History: Prior Function Level of Independence: Independent 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  Transfers Overall transfer level: Needs assistance Equipment used: Rolling walker (2 wheeled) Transfers: Sit to/from Stand Sit to Stand: Min assist Stand pivot transfers: Mod assist, +2 safety/equipment General transfer comment: MIn assist to power up to full standing position. Mod assist while ambulating.  Ambulation/Gait Ambulation/Gait assistance: Min assist, +2 safety/equipment Ambulation Distance (Feet): 60 Feet(x2) Assistive device: Rolling walker (2 wheeled) Gait Pattern/deviations: Step-to pattern, Decreased stride length, Decreased step length - right, Decreased weight shift to left, Narrow base of support General Gait Details: v/c's to complete L quad set to prevent buckling when advancing the R LE. Pt with only 1 episode of L knee buckling but able to catch self, most likely due to fatigue as it was at the end. minA at L UE on walker due to weak grip and hand rotating off with fatigue. Pt began to have step through pattern towards the end of the walk as well  Gait velocity: slow Gait velocity interpretation: <1.8 ft/sec, indicate of risk for recurrent falls   ADL: ADL Overall ADL's : Needs assistance/impaired Eating/Feeding: Set up, Sitting Grooming: Minimal assistance, Standing Upper Body Bathing: Minimal assistance, Sitting Lower Body Bathing: Sit to/from stand, Moderate assistance Upper Body Dressing : Minimal assistance, Sitting Lower Body Dressing: Sit to/from stand, Moderate assistance Toilet Transfer: Ambulation, RW, Moderate assistance Toilet Transfer Details (indicate cue type and reason): Assist for balance and coordination Toileting- Clothing Manipulation and Hygiene: Minimal assistance, Sit to/from stand Functional mobility during ADLs:  Rolling walker, Moderate assistance General ADL Comments: Pt requiring assistance for balance and coordination when standing. Limited by decreased LUE coordination and proprioception.    Cognition: Cognition Overall Cognitive Status: Within Functional Limits for tasks assessed Arousal/Alertness: Awake/alert Orientation Level: Oriented X4 Attention: Selective Selective Attention: Appears intact Memory: Appears intact Awareness: Appears intact Problem Solving: Appears intact Cognition Arousal/Alertness: Awake/alert Behavior During Therapy: WFL for tasks assessed/performed Overall Cognitive Status: Within Functional Limits for tasks assessed     Blood pressure 118/90, pulse 67, temperature 97.8 F (36.6 C), temperature source Oral, resp. rate 14, height _0  (1.727 m), weight 72.2 kg (159 lb 2.8 oz), SpO2 99 %. Physical Exam  Nursing note and vitals reviewed. Neurological:  Speech clear and fluent.     General: No acute distress Mood and affect are appropriate Heart: Regular rate and rhythm no rubs murmurs or extra sounds Lungs: Clear to auscultation, breathing unlabored, no rales or wheezes Abdomen: Positive bowel sounds, soft nontender to palpation, nondistended Extremities: No clubbing, cyanosis, or edema Skin: No evidence of breakdown, no evidence of rash Neurologic: Cranial nerves II through XII intact, motor strength is 5/5 in R deltoid, bicep, tricep, grip, hip flexor, knee extensors, ankle dorsiflexor and plantar flexor 4/5 left deltoid bicep tricep grip 4- left hip flexor 4- left knee extensor 4- left  ankle dorsiflexor Sensory exam normal sensation to light touch and proprioception in bilateral upper and lower extremities Patient notes that light touch feels differently on the left side compared to the right side particularly in his face and left arm Cerebellar exam normal finger to nose to finger as well as heel to shin in Right upper and lower extremities.    Musculoskeletal: Full range of motion in all 4 extremities. No joint swelling   Lab Results Last 48 Hours        Results for orders placed or performed during the hospital encounter of 06/05/17 (from the past 48 hour(s))  CBC     Status: None    Collection Time: 06/07/17  6:28 AM  Result Value Ref Range    WBC 6.7 4.0 - 10.5 K/uL    RBC 5.16 4.22 - 5.81 MIL/uL    Hemoglobin 14.6 13.0 - 17.0 g/dL    HCT 44.4 39.0 - 52.0 %    MCV 86.0 78.0 - 100.0 fL    MCH 28.3 26.0 - 34.0 pg    MCHC 32.9 30.0 - 36.0 g/dL    RDW 13.1 11.5 - 15.5 %    Platelets 282 150 - 400 K/uL      Comment: Performed at [facility], [place] [address] [place], [place] [address]  Basic metabolic panel     Status: Abnormal    Collection Time: 06/07/17  6:28 AM  Result Value Ref Range    Sodium 139 135 - 145 mmol/L    Potassium 4.1 3.5 - 5.1 mmol/L    Chloride 108 101 - 111 mmol/L    CO2 22 22 - 32 mmol/L    Glucose, Bld 107 (H) 65 - 99 mg/dL    BUN 16 6 - 20 mg/dL    Creatinine, Ser 0.87 0.61 - 1.24 mg/dL    Calcium 9.3 8.9 - 10.3 mg/dL    GFR calc non Af Amer >60 >60 mL/min    GFR calc Af Amer >60 >60 mL/min      Comment: (NOTE) The eGFR has been calculated using the CKD EPI equation. This calculation has not been validated in all clinical situations. eGFR's persistently <60 mL/min signify possible Chronic Kidney Disease.      Anion gap 9 5 - 15      Comment: Performed at [facility] [address] [place], [place] [address]  TSH     Status: None    Collection Time: 06/07/17  6:28 AM  Result Value Ref Range    TSH 2.683 0.350 - 4.500 uIU/mL      Comment: Performed by a 3rd Generation assay with a functional sensitivity of <=0.01 uIU/mL. Performed at [facility], [place] [address], [place], [place] [address]    Vitamin B12     Status: None    Collection Time: 06/07/17  6:28 AM  Result Value Ref Range    Vitamin B-12 505 180 - 914 pg/mL      Comment: (NOTE) This assay is not  validated for testing neonatal or myeloproliferative syndrome specimens for Vitamin B12 levels. Performed at [facility], [place] [address], [place], [place] [address]         Imaging Results (Last 48 hours)  Mr Brain Wo Contrast   Result Date: 06/06/2017 CLINICAL DATA:  LEFT-sided weakness.  Follow-up stroke. EXAM: MRI HEAD WITHOUT CONTRAST TECHNIQUE: Multiplanar, multiecho pulse sequences of the brain and surrounding structures were obtained without intravenous  contrast. COMPARISON:  CT HEAD Jun 05, 2017 and CTA HEAD and neck Jun 05, 2017. FINDINGS: INTRACRANIAL CONTENTS: Crescentic reduced diffusion RIGHT basal ganglia/corona radiata and subcentimeter foci of reduced diffusion RIGHT occipital lobe. Areas of diffusion abnormality demonstrate low ADC values. No susceptibility artifact to suggest hemorrhage. Ventricles and sulci are normal for patient's age. A few punctate supratentorial white matter FLAIR T2 hyperintensities exclusive of aforementioned abnormality, normal for age. No midline shift, mass effect or masses. No abnormal extra-axial fluid collections. VASCULAR: Normal major intracranial vascular flow voids present at skull base. SKULL AND UPPER CERVICAL SPINE: No abnormal sellar expansion. No suspicious calvarial bone marrow signal. Craniocervical junction maintained. SINUSES/ORBITS: Trace paranasal sinus mucosal thickening with possible RIGHT maxillary mucosal retention cyst.The included ocular globes and orbital contents are non-suspicious. OTHER: None. IMPRESSION: 1. Acute RIGHT basal ganglia/corona radiata infarct. Tiny RIGHT acute occipital posterior watershed territory infarcts. 2. Otherwise negative noncontrast MRI of the head for age. Electronically Signed   By: Elon Alas M.D.   On: 06/06/2017 03:31             Medical Problem List and Plan: 1.    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  DVT  Prophylaxis/Anticoagulation: Pharmaceutical: Lovenox 3. LBP/Pain Management: Resume gabapentin to help with LLE radiculopathy.  4. Mood: LCSW to follow for evaluation and support.  5. Neuropsych: This patient is capable of making decisions on his own behalf.  6. Skin/Wound Care: routine pressure relief measures.  7. Fluids/Electrolytes/Nutrition: Po intake good--augment bowel regimen.  8. HTN: Monitor BP bid. Continue Lisinopril bid. 9. Dyslipidemia: On Lipitor now. 10. Constipation: Will schedule miralax to bid for now.  11. BPH: On flomax.     Post Admission Physician Evaluation: 1. Functional deficits secondary  to right basal ganglia/corona radiata infarct with left hemiparesis. 2. Patient admitted to receive collaborative, interdisciplinary care between the physiatrist, rehab nursing staff, and therapy team. 3. Patient's level of medical complexity and substantial therapy needs in context of that medical necessity cannot be provided at a lesser intensity of care. 4. Patient has experienced substantial functional loss from his/her baseline. Judging by the patient's diagnosis, physical exam, and functional history, the patient has potential for functional progress which will result in measurable gains while on inpatient rehab.  These gains will be of substantial and practical use upon discharge in facilitating mobility and self-care at the household level. 5. Physiatrist will provide 24 hour management of medical needs as well as oversight of the therapy plan/treatment and provide guidance as appropriate regarding the interaction of the two. 6. 24 hour rehab nursing will assist in the management of  bladder management, bowel management, safety, skin/wound care, disease management, medication administration, pain management and patient education  and help integrate therapy concepts, techniques,education, etc. 7. PT will assess and treat for:  Marland Kitchen  Goals are: independent with assistive  device. 8. OT will assess and treat for  .  Goals are: independent with increased time.  9. SLP will assess and treat for  .  Goals are: N/A. 10. Case Management and Social Worker will assess and treat for psychological issues and discharge planning. 11. Team conference will be held weekly to assess progress toward goals and to determine barriers to discharge. 12.  Patient will receive at least 3 hours of therapy per day at least 5 days per week. 13. ELOS and Prognosis: 7 days excellent       "I have personally performed a face  to face diagnostic evaluation of this patient.  Additionally, I have reviewed and concur with the physician assistant's documentation above."  Charlett Blake M.D. [facility] FAAPM&R (Sports Med, Neuromuscular Med) Diplomate [employer], PA-C 06/07/2017

## 2017-06-07 NOTE — Progress Notes (Addendum)
Physical Therapy Treatment Patient Details Name: Robert Sandoval MRN: [id number] DOB: 1960/07/26 Today's Date: 06/07/2017    History of Present Illness  Robert Sandoval is a 57 y.o. male past medical history of hypertension admitted as an acute code stroke for left-sided weakness.  Underwent TPA administration.     PT Comments    Patient is making progress toward PT goals and tolerated session well. Wife present in room. Pt required min/mod A for gait training and reliant on UE support for balance due to L side weakness. Current plan remains appropriate.    Follow Up Recommendations  CIR     Equipment Recommendations  Other (comment)(TBD at next venue)    Recommendations for Other Services Rehab consult     Precautions / Restrictions Precautions Precautions: Fall Precaution Comments: BP< 180/105 Restrictions Weight Bearing Restrictions: No    Mobility  Bed Mobility Overal bed mobility: Needs Assistance Bed Mobility: Supine to Sit     Supine to sit: HOB elevated;Supervision     General bed mobility comments: supervision for safety; increased time and effort needed  Transfers Overall transfer level: Needs assistance Equipment used: Rolling walker (2 wheeled) Transfers: Sit to/from Stand Sit to Stand: Min guard         General transfer comment: cues for safe hand placement; min guard for safety  Ambulation/Gait Ambulation/Gait assistance: Min assist;+2 safety/equipment;Mod assist Ambulation Distance (Feet): 150 Feet(with standing rest break) Assistive device: Rolling walker (2 wheeled);1 person hand held assist Gait Pattern/deviations: Decreased stride length;Decreased weight shift to left;Narrow base of support;Step-through pattern;Decreased step length - right;Decreased step length - left;Ataxic(ataxia noted L LE without use of AD) Gait velocity: decreased   General Gait Details: min A to ambulate with AD and mod A with HHA for balance and weight shifting; L knee  buckling with fatigue; pt given cues to self monitor for need of rest breaks    Stairs             Wheelchair Mobility    Modified Rankin (Stroke Patients Only) Modified Rankin (Stroke Patients Only) Pre-Morbid Rankin Score: No symptoms Modified Rankin: Moderately severe disability     Balance Overall balance assessment: Needs assistance Sitting-balance support: No upper extremity supported Sitting balance-Leahy Scale: Good     Standing balance support: Bilateral upper extremity supported Standing balance-Leahy Scale: Poor Standing balance comment: UE support for balance                            Cognition Arousal/Alertness: Awake/alert Behavior During Therapy: WFL for tasks assessed/performed Overall Cognitive Status: Within Functional Limits for tasks assessed                                        Exercises General Exercises - Lower Extremity Ankle Circles/Pumps: AROM;Left;10 reps;Seated Long Arc Quad: AROM;Left;Seated  STS X6     General Comments        Pertinent Vitals/Pain Pain Assessment: No/denies pain    Home Living                      Prior Function            PT Goals (current goals can now be found in the care plan section) Acute Rehab PT Goals Patient Stated Goal: return to independent Progress towards PT goals: Progressing toward goals    Frequency  Min 4X/week      PT Plan Current plan remains appropriate    Co-evaluation              AM-PAC PT "6 Clicks" Daily Activity  Outcome Measure  Difficulty turning over in bed (including adjusting bedclothes, sheets and blankets)?: A Little Difficulty moving from lying on back to sitting on the side of the bed? : A Little Difficulty sitting down on and standing up from a chair with arms (e.g., wheelchair, bedside commode, etc,.)?: Unable Help needed moving to and from a bed to chair (including a wheelchair)?: A Little Help needed  walking in hospital room?: A Little Help needed climbing 3-5 steps with a railing? : A Lot 6 Click Score: 15    End of Session Equipment Utilized During Treatment: Gait belt Activity Tolerance: Patient tolerated treatment well Patient left: in chair;with call bell/phone within reach;with family/visitor present;with chair alarm set Nurse Communication: Mobility status PT Visit Diagnosis: Other abnormalities of gait and mobility (R26.89);Other symptoms and signs involving the nervous system (R29.898);Hemiplegia and hemiparesis Hemiplegia - Right/Left: Left Hemiplegia - dominant/non-dominant: Non-dominant Hemiplegia - caused by: Cerebral infarction     Time: 1216-2446 PT Time Calculation (min) (ACUTE ONLY): 16 min  Charges:  $Gait Training: 8-22 mins                    G Codes:       Earney Navy, PTA Pager: [phone]     Darliss Cheney 06/07/2017, 2:54 PM

## 2017-06-07 NOTE — Discharge Summary (Addendum)
Stroke Discharge Summary  Patient ID: Robert Sandoval   MRN: [id number]      DOB: [birth year]  Date of Admission: 06/05/2017 Date of Discharge: 06/07/2017  Attending Physician:  Rosalin Hawking, MD, Stroke MD Consultant(s):   Alysia Penna, MD (Physical Medicine & Rehabtilitation)  Patient's PCP:  Patient, No Pcp Per  Discharge Diagnoses:  Principal Problem:   Acute ischemic stroke ([place]) - R basal ganglia s/p IV tPA Active Problems:   Essential hypertension   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 [facility])   Gait disturbance, post-stroke   Hyperlipidemia  Past Medical History:  Diagnosis Date  . Allergy   . Hyperlipidemia   . Hypertension    History reviewed. No pertinent surgical history.  Medications to be continued on Rehab Allergies as of 06/07/2017   No Known Allergies     Medication List    STOP taking these medications   hydrocortisone 2.5 % rectal cream Commonly known as:  ANUSOL-HC   polyethylene glycol powder powder Commonly known as:  GLYCOLAX/MIRALAX Replaced by:  polyethylene glycol packet     TAKE these medications   aspirin 81 MG EC tablet Take 1 tablet (81 mg total) by mouth daily. Start taking on:  06/08/2017   atorvastatin 20 MG tablet Commonly known as:  LIPITOR Take 1 tablet (20 mg total) by mouth daily at 6 PM.   clopidogrel 75 MG tablet Commonly known as:  PLAVIX Take 1 tablet (75 mg total) by mouth daily. Start taking on:  06/08/2017   gabapentin 300 MG capsule Commonly known as:  NEURONTIN Take 1 capsule (300 mg total) by mouth 3 (three) times daily. What changed:    when to take this  reasons to take this   lisinopril 20 MG tablet Commonly known as:  PRINIVIL,ZESTRIL Take 1 tablet (20 mg total) by mouth 2 (two) times daily. What changed:  when to take this   polyethylene glycol packet Commonly known as:  MIRALAX / GLYCOLAX Take 17 g by mouth daily as needed for moderate  constipation. Replaces:  polyethylene glycol powder powder   senna-docusate 8.6-50 MG tablet Commonly known as:  Senokot-S Take 1 tablet by mouth at bedtime as needed for mild constipation.   tamsulosin 0.4 MG Caps capsule Commonly known as:  FLOMAX Take 0.4 mg by mouth daily.       LABORATORY STUDIES CBC    Component Value Date/Time   WBC 6.7 06/07/2017 0628   RBC 5.16 06/07/2017 0628   HGB 14.6 06/07/2017 0628   HCT 44.4 06/07/2017 0628   PLT 282 06/07/2017 0628   MCV 86.0 06/07/2017 0628   MCV 88.0 07/06/2014 1324   MCH 28.3 06/07/2017 0628   MCHC 32.9 06/07/2017 0628   RDW 13.1 06/07/2017 0628   LYMPHSABS 2.8 06/05/2017 0236   MONOABS 0.4 06/05/2017 0236   EOSABS 0.2 06/05/2017 0236   BASOSABS 0.1 06/05/2017 0236   CMP    Component Value Date/Time   NA 139 06/07/2017 0628   NA 140 08/05/2016 1531   K 4.1 06/07/2017 0628   CL 108 06/07/2017 0628   CO2 22 06/07/2017 0628   GLUCOSE 107 (H) 06/07/2017 0628   BUN 16 06/07/2017 0628   BUN 8 08/05/2016 1531   CREATININE 0.87 06/07/2017 0628   CREATININE 0.78 07/13/2015 1104   CALCIUM 9.3 06/07/2017 0628   PROT 7.0 06/05/2017 0236   PROT 7.3 08/05/2016 1531   ALBUMIN 3.8 06/05/2017 0236  ALBUMIN 4.4 08/05/2016 1531   AST 22 06/05/2017 0236   ALT 20 06/05/2017 0236   ALKPHOS 82 06/05/2017 0236   BILITOT 1.2 06/05/2017 0236   BILITOT 0.8 08/05/2016 1531   GFRNONAA >60 06/07/2017 0628   GFRNONAA >89 07/13/2015 1104   GFRAA >60 06/07/2017 0628   GFRAA >89 07/13/2015 1104   COAGS Lab Results  Component Value Date   INR 1.04 06/05/2017   Lipid Panel    Component Value Date/Time   CHOL 157 06/05/2017 0401   CHOL 172 08/05/2016 1531   TRIG 195 (H) 06/05/2017 0401   HDL 31 (L) 06/05/2017 0401   HDL 30 (L) 08/05/2016 1531   CHOLHDL 5.1 06/05/2017 0401   VLDL 39 06/05/2017 0401   LDLCALC 87 06/05/2017 0401   LDLCALC 76 08/05/2016 1531   HgbA1C  Lab Results  Component Value Date   HGBA1C 5.6  06/05/2017   Urinalysis    Component Value Date/Time   COLORURINE COLORLESS (A) 06/05/2017 0359   APPEARANCEUR CLEAR 06/05/2017 0359   LABSPEC 1.010 06/05/2017 0359   PHURINE 8.0 06/05/2017 0359   GLUCOSEU NEGATIVE 06/05/2017 0359   HGBUR NEGATIVE 06/05/2017 0359   BILIRUBINUR NEGATIVE 06/05/2017 0359   BILIRUBINUR neg 09/14/2014 [place] 06/05/2017 0359   PROTEINUR NEGATIVE 06/05/2017 0359   UROBILINOGEN 0.2 09/14/2014 1131   NITRITE NEGATIVE 06/05/2017 0359   LEUKOCYTESUR NEGATIVE 06/05/2017 0359   Urine Drug Screen     Component Value Date/Time   LABOPIA NONE DETECTED 06/05/2017 0359   COCAINSCRNUR NONE DETECTED 06/05/2017 0359   LABBENZ NONE DETECTED 06/05/2017 0359   AMPHETMU NONE DETECTED 06/05/2017 0359   THCU NONE DETECTED 06/05/2017 0359   LABBARB NONE DETECTED 06/05/2017 0359    Alcohol Level    Component Value Date/Time   ETH <10 06/05/2017 0236    SIGNIFICANT DIAGNOSTIC STUDIES Ct Angio Head W Or Wo Contrast  Result Date: 06/05/2017 CLINICAL DATA:  Initial evaluation for acute left-sided weakness. EXAM: CT ANGIOGRAPHY HEAD AND NECK TECHNIQUE: Multidetector CT imaging of the head and neck was performed using the standard protocol during bolus administration of intravenous contrast. Multiplanar CT image reconstructions and MIPs were obtained to evaluate the vascular anatomy. Carotid stenosis measurements (when applicable) are obtained utilizing NASCET criteria, using the distal internal carotid diameter as the denominator. CONTRAST:  86mL ISOVUE-370 IOPAMIDOL (ISOVUE-370) INJECTION 76% COMPARISON:  Prior CT performed earlier the same day. FINDINGS: CTA NECK FINDINGS Aortic arch: Visualized aortic arch of normal caliber with normal branch pattern. No flow-limiting stenosis about the origin of the great vessels. Visualized subclavian arteries widely patent. Right carotid system: Right common and internal carotid arteries widely patent without stenosis,  dissection, or occlusion. No atheromatous narrowing about the right carotid bifurcation. Left carotid system: Left common and internal carotid arteries widely patent without stenosis, dissection, or occlusion. No atheromatous narrowing about the left carotid bifurcation. Vertebral arteries: Left vertebral artery arises separately from the aortic arch. Vertebral arteries widely patent within the neck without stenosis, dissection, or occlusion. Skeleton: No acute osseus abnormality. No worrisome lytic or blastic osseous lesions. Mild degenerative disc bulge at C6-7 without significant stenosis. Other neck: No acute soft tissue abnormality within the neck. Salivary glands normal. Thyroid normal. No adenopathy. Upper chest: Visualized upper chest within normal limits. Partially visualized lungs are clear. Review of the MIP images confirms the above findings CTA HEAD FINDINGS Anterior circulation: Internal carotid arteries widely patent to the termini without stenosis. Right A1 patent. Absent/hypoplastic left A1,  accounting for the diminutive left ICA is compared to the right. Normal anterior communicating artery. Anterior cerebral arteries widely patent to their distal aspects without stenosis. No M1 stenosis or occlusion. No proximal M2 occlusion. Distal MCA branches well perfused and symmetric. Posterior circulation: Vertebral arteries widely patent to the vertebrobasilar junction without stenosis. Posterior inferior cerebral arteries patent bilaterally. Basilar artery widely patent to its distal aspect. Superior cerebral arteries patent bilaterally. Both of the posterior cerebral artery supplied via the basilar and are well perfused to their distal aspects without stenosis. Venous sinuses: Patent. Anatomic variants: Hypoplastic/absent left A1 with the anterior cerebral artery supplied via the right carotid artery system. No aneurysm. Delayed phase: Not performed. Review of the MIP images confirms the above findings  IMPRESSION: Normal CTA of the head and neck with no evidence for emergent large vessel occlusion. No significant atheromatous disease for patient age. No hemodynamically significant or correctable stenosis. These results were communicated to Robert Sandoval at 3:28 amon 5/21/2019by text page via the [facility] messaging system. Electronically Signed   By: Jeannine Boga M.D.   On: 06/05/2017 03:43   Ct Angio Neck W Or Wo Contrast  Result Date: 06/05/2017 CLINICAL DATA:  Initial evaluation for acute left-sided weakness. EXAM: CT ANGIOGRAPHY HEAD AND NECK TECHNIQUE: Multidetector CT imaging of the head and neck was performed using the standard protocol during bolus administration of intravenous contrast. Multiplanar CT image reconstructions and MIPs were obtained to evaluate the vascular anatomy. Carotid stenosis measurements (when applicable) are obtained utilizing NASCET criteria, using the distal internal carotid diameter as the denominator. CONTRAST:  69mL ISOVUE-370 IOPAMIDOL (ISOVUE-370) INJECTION 76% COMPARISON:  Prior CT performed earlier the same day. FINDINGS: CTA NECK FINDINGS Aortic arch: Visualized aortic arch of normal caliber with normal branch pattern. No flow-limiting stenosis about the origin of the great vessels. Visualized subclavian arteries widely patent. Right carotid system: Right common and internal carotid arteries widely patent without stenosis, dissection, or occlusion. No atheromatous narrowing about the right carotid bifurcation. Left carotid system: Left common and internal carotid arteries widely patent without stenosis, dissection, or occlusion. No atheromatous narrowing about the left carotid bifurcation. Vertebral arteries: Left vertebral artery arises separately from the aortic arch. Vertebral arteries widely patent within the neck without stenosis, dissection, or occlusion. Skeleton: No acute osseus abnormality. No worrisome lytic or blastic osseous lesions. Mild degenerative disc bulge  at C6-7 without significant stenosis. Other neck: No acute soft tissue abnormality within the neck. Salivary glands normal. Thyroid normal. No adenopathy. Upper chest: Visualized upper chest within normal limits. Partially visualized lungs are clear. Review of the MIP images confirms the above findings CTA HEAD FINDINGS Anterior circulation: Internal carotid arteries widely patent to the termini without stenosis. Right A1 patent. Absent/hypoplastic left A1, accounting for the diminutive left ICA is compared to the right. Normal anterior communicating artery. Anterior cerebral arteries widely patent to their distal aspects without stenosis. No M1 stenosis or occlusion. No proximal M2 occlusion. Distal MCA branches well perfused and symmetric. Posterior circulation: Vertebral arteries widely patent to the vertebrobasilar junction without stenosis. Posterior inferior cerebral arteries patent bilaterally. Basilar artery widely patent to its distal aspect. Superior cerebral arteries patent bilaterally. Both of the posterior cerebral artery supplied via the basilar and are well perfused to their distal aspects without stenosis. Venous sinuses: Patent. Anatomic variants: Hypoplastic/absent left A1 with the anterior cerebral artery supplied via the right carotid artery system. No aneurysm. Delayed phase: Not performed. Review of the MIP images confirms the above findings IMPRESSION:  Normal CTA of the head and neck with no evidence for emergent large vessel occlusion. No significant atheromatous disease for patient age. No hemodynamically significant or correctable stenosis. These results were communicated to Robert Sandoval at 3:28 amon 5/21/2019by text page via the [facility] messaging system. Electronically Signed   By: Jeannine Boga M.D.   On: 06/05/2017 03:43   Mr Brain Wo Contrast  Result Date: 06/06/2017 CLINICAL DATA:  LEFT-sided weakness.  Follow-up stroke. EXAM: MRI HEAD WITHOUT CONTRAST TECHNIQUE: Multiplanar,  multiecho pulse sequences of the brain and surrounding structures were obtained without intravenous contrast. COMPARISON:  CT HEAD Jun 05, 2017 and CTA HEAD and neck Jun 05, 2017. FINDINGS: INTRACRANIAL CONTENTS: Crescentic reduced diffusion RIGHT basal ganglia/corona radiata and subcentimeter foci of reduced diffusion RIGHT occipital lobe. Areas of diffusion abnormality demonstrate low ADC values. No susceptibility artifact to suggest hemorrhage. Ventricles and sulci are normal for patient's age. A few punctate supratentorial white matter FLAIR T2 hyperintensities exclusive of aforementioned abnormality, normal for age. No midline shift, mass effect or masses. No abnormal extra-axial fluid collections. VASCULAR: Normal major intracranial vascular flow voids present at skull base. SKULL AND UPPER CERVICAL SPINE: No abnormal sellar expansion. No suspicious calvarial bone marrow signal. Craniocervical junction maintained. SINUSES/ORBITS: Trace paranasal sinus mucosal thickening with possible RIGHT maxillary mucosal retention cyst.The included ocular globes and orbital contents are non-suspicious. OTHER: None. IMPRESSION: 1. Acute RIGHT basal ganglia/corona radiata infarct. Tiny RIGHT acute occipital posterior watershed territory infarcts. 2. Otherwise negative noncontrast MRI of the head for age. Electronically Signed   By: Elon Alas M.D.   On: 06/06/2017 03:31   Dg Chest Port 1 View  Result Date: 06/05/2017 CLINICAL DATA:  Stroke.  Left-sided weakness. EXAM: PORTABLE CHEST 1 VIEW COMPARISON:  None. FINDINGS: Mild cardiomegaly. Mild aortic tortuosity, otherwise normal mediastinal contours. No pulmonary edema. No focal airspace disease, pleural effusion or pneumothorax. No acute osseous abnormalities. IMPRESSION: Mild cardiomegaly and tortuous thoracic aorta. No acute chest process. Electronically Signed   By: Jeb Levering M.D.   On: 06/05/2017 03:33   Ct Head Code Stroke Wo Contrast  Result Date:  06/05/2017 CLINICAL DATA:  Code stroke. Initial evaluation for acute left-sided weakness. EXAM: CT HEAD WITHOUT CONTRAST TECHNIQUE: Contiguous axial images were obtained from the base of the skull through the vertex without intravenous contrast. COMPARISON:  None. FINDINGS: Brain: Cerebral volume within normal limits. No acute intracranial hemorrhage. No acute large vessel territory infarct. No mass lesion, midline shift or mass effect. No hydrocephalus. No extra-axial fluid collection. Vascular: No asymmetric hyperdense vessel. Skull: Scalp soft tissues and calvarium within normal limits. Sinuses/Orbits: Globes and orbital soft tissues within normal limits. Scattered mucosal thickening throughout the ethmoidal air cells and maxillary sinuses with probable mucocele at the left ethmoidal air cells. Mastoid air cells are clear. Other: None. ASPECTS [facility] Stroke Program Early CT Score) - Ganglionic level infarction (caudate, lentiform nuclei, internal capsule, insula, M1-M3 cortex): 7 - Supraganglionic infarction (M4-M6 cortex): 3 Total score (0-10 with 10 being normal): 10 IMPRESSION: 1. No acute intracranial infarct or other abnormality identified. 2. ASPECTS is 10. These results were communicated to Robert Sandoval at 3:10 amon 5/21/2019by text page via the [facility] messaging system. Electronically Signed   By: Jeannine Boga M.D.   On: 06/05/2017 03:12    TTE - Left ventricle: The cavity size was normal. Wall thickness wasnormal. Systolic function was normal. The estimated ejectionfraction was in the range of 60% to 65%. Wall motion was normal;there were no regional wall motion  abnormalities. Dopplerparameters are consistent with abnormal left ventricularrelaxation (grade 1 diastolic dysfunction). - Aortic valve: There was mild to moderate regurgitation. - Pulmonary arteries: Systolic pressure was mildly increased. PApeak pressure: 31 mm Hg (S). Impressions:   No cardiac source of emboli was  indentified.     HISTORY OF PRESENT ILLNESS Robert Sandoval is a 57 y.o. male past medical history of hypertension, last known well and midnight when he went to bed, and woke up to realize that he cannot move his left arm or leg. He says that he was in his usual state of health all day, did not notice anything different, went to bed as he does around midnight and woke up realizing that his arm and leg had gone to sleep. He tried to get out of bed but was unable to. EMS was called.  He was brought in as an acute code stroke for left-sided weakness.  His only blood pressure was in 190s on initial assessment. Upon initial assessment, his initial NIH stroke scale was 8.  He had complete flaccidity of the left upper extremity, his left leg-he could wiggle his toes but could not get the leg off the bed. He was taken for a noncontrast CT of the head, that did not show any intracranial abnormality.  No bleed. IV TPA  was administered after reviewing contraindications.  He was LKW: 0001 on 06/05/2017. Premorbid modified Rankin scale (mRS): 0   HOSPITAL COURSE Robert Sandoval is a 57 y.o. male with history of HTN and HLD admitted for left arm and leg weakness. TPA given 06/05/2017 at 0256. Tolerated tPA without difficulty. Stroke d/t Small vessel disease - HTN, HLD. D/c to CIR for ongoing PT, OT and ST.   Stroke - right BG/CR infarct, may be small vessel disease source   Resultant left hemiparesis   MRI right BG/CR infarct  CTA head and neck unremarkable  2D Echo EF 60-65%  LDL 87  HgbA1c 5.6  UDS neg  aspirin 81 mg daily prior to admission, now on aspirin 81 mg daily and clopidogrel 75 mg daily. Given mild stroke, will place on aspirin 81 mg and plavix 75 mg daily x 3 weeks, then plavix alone.   Ongoing aggressive stroke risk factor management  Therapy recommendations:  CIR  Disposition:  CIR  Hypertension  On the high end  Increased lisinopril home dose 20 daily to 20mg  bid  Long term BP  goal normotensive  Hyperlipidemia  Home meds:  none   LDL 87, goal < 70  Now on lipitor 20  Continue statin at discharge  Other Active Problems  Elevated TG  Left sciatica - continue neurontin   BPH - continue flomax    DISCHARGE EXAM Blood pressure 118/90, pulse 67, temperature 97.8 F (36.6 C), temperature source Oral, resp. rate 14, height 5\' 8"  (1.727 m), weight 72.2 kg (159 lb 2.8 oz), SpO2 99 %. General - Well nourished, well developed, in no apparent distress. Cardiovascular - Regular rate and rhythm. Mental Status -  Level of arousal and orientation to time, place, and person were intact. Language including expression, naming, repetition, comprehension was assessed and found intact. Attention span and concentration were normal. Fund of Knowledge was assessed and was intact. Cranial Nerves II - XII - II - Visual field intact OU. III, IV, VI - Extraocular movements intact. V - Facial sensation decreased on the left VII - Facial movement intact bilaterally. VIII - Hearing & vestibular intact bilaterally. X - Palate elevates symmetrically.  XI - Chin turning & shoulder shrug intact bilaterally. XII - Tongue protrusion intact. Motor Strength - The patient's strength was normal in RUE and RLE, LUE 4-/5 proximal, 4/5 distal, LLE proximal and distal 4-/5.  Bulk was normal and fasciculations were absent.   Motor Tone - Muscle tone was assessed at the neck and appendages and was normal. Reflexes - The patient's reflexes were symmetrical in all extremities and he had no pathological reflexes. Sensory - Light touch, temperature/pinprick were assessed and were decreased on the left   Coordination - The patient had normal movements in the right hand and foot with no ataxia or dysmetria.  Tremor was absent. Gait and Station - not tested   Discharge Diet  Regular thin liquids  DISCHARGE PLAN  Disposition:  Transfer to [place] for ongoing PT, OT and  ST  aspirin 81 mg daily and clopidogrel 75 mg daily for secondary stroke prevention x 3 months then PLAVIX alone  Recommend ongoing risk factor control by Primary Care Physician at time of discharge from inpatient rehabilitation.  Follow-up PCP in 2 weeks following discharge from rehab.  Follow-up in [facility] in 4 weeks following discharge from rehab, office to schedule an appointment.   30 minutes were spent preparing discharge.  Burnetta Sabin, MSN, APRN, ANVP-BC, AGPCNP-BC Advanced Practice Stroke Nurse [place] for Schedule & Pager information 06/07/2017 3:43 PM    ATTENDING NOTE: I reviewed above note and agree with the assessment and plan. I have made any additions or clarifications directly to the above note. Pt was seen and examined.   Pt wife at bedside. Pt sitting in bed. Doing well, left side hemiparesis continues to improve. No acute event overnight. PT/OT recommend CIR. Plan for CIR transfer today.   Rosalin Hawking, MD PhD Stroke Neurology 06/07/2017 5:51 PM

## 2017-06-07 NOTE — Care Management Note (Signed)
Case Management Note  Patient Details  Name: Robert Sandoval MRN: [id number] Date of Birth: 10/07/1960  Subjective/Objective:    Pt admitted with CVA. He is from home with spouse.                Action/Plan: Pt discharging to CIR today. CM singing off.   Expected Discharge Date:  06/07/17               Expected Discharge Plan:  [place]  In-House Referral:     Discharge planning Services  CM Consult  Post Acute Care Choice:    Choice offered to:     DME Arranged:    DME Agency:     HH Arranged:    HH Agency:     Status of Service:  Completed, signed off  If discussed at [employer], dates discussed:    Additional Comments:  Pollie Friar, RN 06/07/2017, 1:35 PM

## 2017-06-07 NOTE — Progress Notes (Signed)
Physical Medicine and Rehabilitation Consult Reason for Consult: Left-sided weakness Referring Physician: Dr.Xu   HPI: Robert Sandoval is a 57 y.o. right-handed male with history of hypertension and hyperlipidemia.  Per chart review patient lives with spouse.  One level apartment with 2 steps to entry.  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  Presented 06/05/2017 with acute onset of left-sided weakness.  Initial blood pressures with systolics 166A.  Cranial CT scan negative for acute changes.  UDS was negative.  Patient did receive TPA.  CT angiogram of head and neck negative for emergent large vessel occlusion or stenosis.  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  Tiny right acute occipital posterior watershed territory infarction.  Echocardiogram with ejection fraction of 63% grade 1 diastolic dysfunction.  Cardene drip for blood pressure control.  Neurology consulted with work-up presently ongoing.  Physical therapy evaluation completed 06/05/2017 with recommendations of physical medicine rehab consult.   Review of Systems  Constitutional: Negative for chills and fever.  HENT: Negative for hearing loss.   Eyes: Negative for blurred vision and double vision.  Respiratory: Negative for shortness of breath.   Cardiovascular: Negative for chest pain, palpitations and leg swelling.  Gastrointestinal: Positive for constipation. Negative for nausea and vomiting.  Genitourinary: Positive for urgency. Negative for dysuria, flank pain and hematuria.  Skin: Negative for rash.  Neurological: Positive for tingling, sensory change and focal weakness.  All other systems reviewed and are negative.      Past Medical History:  Diagnosis Date  . Allergy   . Hyperlipidemia   . Hypertension    History reviewed. No pertinent surgical history.      Family History  Problem Relation Age of Onset  . Hyperlipidemia Father   . Asthma  Father    Social History:  reports that he has never smoked. He has never used smokeless tobacco. He reports that he drinks alcohol. He reports that he does not use drugs. Allergies: No Known Allergies       Medications Prior to Admission  Medication Sig Dispense Refill  . gabapentin (NEURONTIN) 300 MG capsule Take 300 mg by mouth 3 (three) times daily as needed (for pain).     Marland Kitchen lisinopril (PRINIVIL,ZESTRIL) 20 MG tablet Take 1 tablet (20 mg total) by mouth daily. 90 tablet 3  . tamsulosin (FLOMAX) 0.4 MG CAPS capsule Take 0.4 mg by mouth daily.    . hydrocortisone (ANUSOL-HC) 2.5 % rectal cream Place 1 application rectally at bedtime. (Patient not taking: Reported on 06/05/2017) 30 g 0  . polyethylene glycol powder (GLYCOLAX/MIRALAX) powder Take 17 g by mouth daily. 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 Functional Status:  Mobility: [place] bed mobility: Needs Assistance Bed Mobility: Supine to Sit Supine to sit: HOB elevated, Mod assist General bed mobility comments: assist for L leg and balance with trunk Transfers Overall transfer level: Needs assistance Equipment used: 2 person hand held assist Transfers: Sit to/from Stand, [employer]  Sit to Stand: Min assist, +2 safety/equipment Stand pivot transfers: Mod assist, +2 safety/equipment General transfer comment: stood initially unaided, but off  balance with L LE weakness, stood with +2 A for safety and lines, stepping L forward first, then L LE buckling assist for stance phase stability with stepping to chair and cues for sequencing bilat UE support  ADL:  Cognition: Cognition Overall Cognitive Status: Within Functional Limits for tasks assessed Arousal/Alertness: Awake/alert Orientation Level: Oriented X4 Attention: Selective Selective Attention: Appears intact Memory: Appears intact Awareness: Appears intact Problem Solving: Appears intact Cognition Arousal/Alertness: Awake/alert Behavior During Therapy: WFL for tasks assessed/performed Overall Cognitive Status: Within Functional Limits for tasks assessed  Blood pressure (!) 122/92, pulse (!) 59, temperature 98.4 F (36.9 C), temperature source Oral, resp. rate 13, height 5\' 8"  (1.727 m), weight 72.2 kg (159 lb 2.8 oz), SpO2 95 %. Physical Exam  Vitals reviewed. Constitutional: He is oriented to person, place, and time. He appears well-developed.  HENT:  Head: Normocephalic.  Eyes: EOM are normal.  Neck: Normal range of motion. Neck supple. No thyromegaly present.  Cardiovascular: Normal rate, regular rhythm and normal heart sounds.  Respiratory: Effort normal and breath sounds normal. No respiratory distress.  GI: Soft. Bowel sounds are normal. He exhibits no distension.  Neurological: He is alert and oriented to person, place, and time.  Skin: Skin is warm and dry.  Neuro:  Tone is normal without evidence of spasticity Cerebellar exam shows + evidence of ataxia on finger nose finger or heel to shin testing Left side only  No evidence of trunkal ataxia  Motor strength is 5/5 in Right and 4/5 in Left deltoid, biceps, triceps, finger flexors and extensors, wrist flexors and extensors, hip flexors, knee flexors and extensors, ankle dorsiflexors, plantar flexors, invertors and evertors, toe flexors and extensors  Sensory exam able to distinguish light touch  in the upper and lower limbs   Cranial nerves II- Visual fields are intact to confrontation testing, no blurring of vision III- no evidence of ptosis, upward, downward and medial gaze intact IV- no vertical diplopia or head tilt V- no facial numbness or masseter weakness VI- no pupil abduction weakness VII- no facial droop, good lid closure VII- normal auditory acuity IX- no pharygeal weakness,  X- no pharyngeal weakness, no hoarseness XI- no trap or SCM weakness XII- no glossal weakness  Sit to stand minG  LabResultsLast24Hours  No results found for this or any previous visit (from the past 24 hour(s)).    ImagingResults(Last48hours)  Ct Angio Head W Or Wo Contrast  Result Date: 06/05/2017 CLINICAL DATA:  Initial evaluation for acute left-sided weakness. EXAM: CT ANGIOGRAPHY HEAD AND NECK TECHNIQUE: Multidetector CT imaging of the head and neck was performed using the standard protocol during bolus administration of intravenous contrast. Multiplanar CT image reconstructions and MIPs were obtained to evaluate the vascular anatomy. Carotid stenosis measurements (when applicable) are obtained utilizing NASCET criteria, using the distal internal carotid diameter as the denominator. CONTRAST:  55mL ISOVUE-370 IOPAMIDOL (ISOVUE-370) INJECTION 76% COMPARISON:  Prior CT performed earlier the same day. FINDINGS: CTA NECK FINDINGS Aortic arch: Visualized aortic arch of normal caliber with normal branch pattern. No flow-limiting stenosis about the origin of the great vessels. Visualized subclavian arteries widely patent. Right carotid system: Right common and internal carotid arteries widely patent without stenosis, dissection, or occlusion. No atheromatous narrowing about the right carotid bifurcation. Left carotid system: Left common and internal carotid arteries widely patent without stenosis, dissection, or occlusion. No  atheromatous narrowing about the left carotid bifurcation.  Vertebral arteries: Left vertebral artery arises separately from the aortic arch. Vertebral arteries widely patent within the neck without stenosis, dissection, or occlusion. Skeleton: No acute osseus abnormality. No worrisome lytic or blastic osseous lesions. Mild degenerative disc bulge at C6-7 without significant stenosis. Other neck: No acute soft tissue abnormality within the neck. Salivary glands normal. Thyroid normal. No adenopathy. Upper chest: Visualized upper chest within normal limits. Partially visualized lungs are clear. Review of the MIP images confirms the above findings CTA HEAD FINDINGS Anterior circulation: Internal carotid arteries widely patent to the termini without stenosis. Right A1 patent. Absent/hypoplastic left A1, accounting for the diminutive left ICA is compared to the right. Normal anterior communicating artery. Anterior cerebral arteries widely patent to their distal aspects without stenosis. No M1 stenosis or occlusion. No proximal M2 occlusion. Distal MCA branches well perfused and symmetric. Posterior circulation: Vertebral arteries widely patent to the vertebrobasilar junction without stenosis. Posterior inferior cerebral arteries patent bilaterally. Basilar artery widely patent to its distal aspect. Superior cerebral arteries patent bilaterally. Both of the posterior cerebral artery supplied via the basilar and are well perfused to their distal aspects without stenosis. Venous sinuses: Patent. Anatomic variants: Hypoplastic/absent left A1 with the anterior cerebral artery supplied via the right carotid artery system. No aneurysm. Delayed phase: Not performed. Review of the MIP images confirms the above findings IMPRESSION: Normal CTA of the head and neck with no evidence for emergent large vessel occlusion. No significant atheromatous disease for patient age. No hemodynamically significant or correctable stenosis. These results were communicated to Rory Percy at 3:28 amon 5/21/2019by  text page via the [facility] messaging system. Electronically Signed   By: Jeannine Boga M.D.   On: 06/05/2017 03:43   Ct Angio Neck W Or Wo Contrast  Result Date: 06/05/2017 CLINICAL DATA:  Initial evaluation for acute left-sided weakness. EXAM: CT ANGIOGRAPHY HEAD AND NECK TECHNIQUE: Multidetector CT imaging of the head and neck was performed using the standard protocol during bolus administration of intravenous contrast. Multiplanar CT image reconstructions and MIPs were obtained to evaluate the vascular anatomy. Carotid stenosis measurements (when applicable) are obtained utilizing NASCET criteria, using the distal internal carotid diameter as the denominator. CONTRAST:  76mL ISOVUE-370 IOPAMIDOL (ISOVUE-370) INJECTION 76% COMPARISON:  Prior CT performed earlier the same day. FINDINGS: CTA NECK FINDINGS Aortic arch: Visualized aortic arch of normal caliber with normal branch pattern. No flow-limiting stenosis about the origin of the great vessels. Visualized subclavian arteries widely patent. Right carotid system: Right common and internal carotid arteries widely patent without stenosis, dissection, or occlusion. No atheromatous narrowing about the right carotid bifurcation. Left carotid system: Left common and internal carotid arteries widely patent without stenosis, dissection, or occlusion. No atheromatous narrowing about the left carotid bifurcation. Vertebral arteries: Left vertebral artery arises separately from the aortic arch. Vertebral arteries widely patent within the neck without stenosis, dissection, or occlusion. Skeleton: No acute osseus abnormality. No worrisome lytic or blastic osseous lesions. Mild degenerative disc bulge at C6-7 without significant stenosis. Other neck: No acute soft tissue abnormality within the neck. Salivary glands normal. Thyroid normal. No adenopathy. Upper chest: Visualized upper chest within normal limits. Partially visualized lungs are clear. Review of the MIP  images confirms the above findings CTA HEAD FINDINGS Anterior circulation: Internal carotid arteries widely patent to the termini without stenosis. Right A1 patent. Absent/hypoplastic left A1, accounting for the diminutive left ICA is compared to the right. Normal anterior communicating artery. Anterior cerebral  arteries widely patent to their distal aspects without stenosis. No M1 stenosis or occlusion. No proximal M2 occlusion. Distal MCA branches well perfused and symmetric. Posterior circulation: Vertebral arteries widely patent to the vertebrobasilar junction without stenosis. Posterior inferior cerebral arteries patent bilaterally. Basilar artery widely patent to its distal aspect. Superior cerebral arteries patent bilaterally. Both of the posterior cerebral artery supplied via the basilar and are well perfused to their distal aspects without stenosis. Venous sinuses: Patent. Anatomic variants: Hypoplastic/absent left A1 with the anterior cerebral artery supplied via the right carotid artery system. No aneurysm. Delayed phase: Not performed. Review of the MIP images confirms the above findings IMPRESSION: Normal CTA of the head and neck with no evidence for emergent large vessel occlusion. No significant atheromatous disease for patient age. No hemodynamically significant or correctable stenosis. These results were communicated to Rory Percy at 3:28 amon 5/21/2019by text page via the [facility] messaging system. Electronically Signed   By: Jeannine Boga M.D.   On: 06/05/2017 03:43   Mr Brain Wo Contrast  Result Date: 06/06/2017 CLINICAL DATA:  LEFT-sided weakness.  Follow-up stroke. EXAM: MRI HEAD WITHOUT CONTRAST TECHNIQUE: Multiplanar, multiecho pulse sequences of the brain and surrounding structures were obtained without intravenous contrast. COMPARISON:  CT HEAD Jun 05, 2017 and CTA HEAD and neck Jun 05, 2017. FINDINGS: INTRACRANIAL CONTENTS: Crescentic reduced diffusion RIGHT basal ganglia/corona  radiata and subcentimeter foci of reduced diffusion RIGHT occipital lobe. Areas of diffusion abnormality demonstrate low ADC values. No susceptibility artifact to suggest hemorrhage. Ventricles and sulci are normal for patient's age. A few punctate supratentorial white matter FLAIR T2 hyperintensities exclusive of aforementioned abnormality, normal for age. No midline shift, mass effect or masses. No abnormal extra-axial fluid collections. VASCULAR: Normal major intracranial vascular flow voids present at skull base. SKULL AND UPPER CERVICAL SPINE: No abnormal sellar expansion. No suspicious calvarial bone marrow signal. Craniocervical junction maintained. SINUSES/ORBITS: Trace paranasal sinus mucosal thickening with possible RIGHT maxillary mucosal retention cyst.The included ocular globes and orbital contents are non-suspicious. OTHER: None. IMPRESSION: 1. Acute RIGHT basal ganglia/corona radiata infarct. Tiny RIGHT acute occipital posterior watershed territory infarcts. 2. Otherwise negative noncontrast MRI of the head for age. Electronically Signed   By: Elon Alas M.D.   On: 06/06/2017 03:31   Dg Chest Port 1 View  Result Date: 06/05/2017 CLINICAL DATA:  Stroke.  Left-sided weakness. EXAM: PORTABLE CHEST 1 VIEW COMPARISON:  None. FINDINGS: Mild cardiomegaly. Mild aortic tortuosity, otherwise normal mediastinal contours. No pulmonary edema. No focal airspace disease, pleural effusion or pneumothorax. No acute osseous abnormalities. IMPRESSION: Mild cardiomegaly and tortuous thoracic aorta. No acute chest process. Electronically Signed   By: Jeb Levering M.D.   On: 06/05/2017 03:33   Ct Head Code Stroke Wo Contrast  Result Date: 06/05/2017 CLINICAL DATA:  Code stroke. Initial evaluation for acute left-sided weakness. EXAM: CT HEAD WITHOUT CONTRAST TECHNIQUE: Contiguous axial images were obtained from the base of the skull through the vertex without intravenous contrast. COMPARISON:  None.  FINDINGS: Brain: Cerebral volume within normal limits. No acute intracranial hemorrhage. No acute large vessel territory infarct. No mass lesion, midline shift or mass effect. No hydrocephalus. No extra-axial fluid collection. Vascular: No asymmetric hyperdense vessel. Skull: Scalp soft tissues and calvarium within normal limits. Sinuses/Orbits: Globes and orbital soft tissues within normal limits. Scattered mucosal thickening throughout the ethmoidal air cells and maxillary sinuses with probable mucocele at the left ethmoidal air cells. Mastoid air cells are clear. Other: None. ASPECTS [facility] Stroke Program Early CT  Score) - Ganglionic level infarction (caudate, lentiform nuclei, internal capsule, insula, M1-M3 cortex): 7 - Supraganglionic infarction (M4-M6 cortex): 3 Total score (0-10 with 10 being normal): 10 IMPRESSION: 1. No acute intracranial infarct or other abnormality identified. 2. ASPECTS is 10. These results were communicated to Rory Percy at 3:10 amon 5/21/2019by text page via the [facility] messaging system. Electronically Signed   By: Jeannine Boga M.D.   On: 06/05/2017 03:12     Assessment/Plan: Diagnosis: Right basal ganglia/Corona Radiata infarct, thrombotic 1. Does the need for close, 24 hr/day medical supervision in concert with the patient's rehab needs make it unreasonable for this patient to be served in a less intensive setting? Yes 2. Co-Morbidities requiring supervision/potential complications: Uncontrolled hypertension 3. Due to bladder management, bowel management, safety, disease management, medication administration and patient education, does the patient require 24 hr/day rehab nursing? Yes 4. Does the patient require coordinated care of a physician, rehab nurse, PT (1-2 hrs/day, 5 days/week) and OT (1-2 hrs/day, 5 days/week) to address physical and functional deficits in the context of the above medical diagnosis(es)? Yes Addressing deficits in the following areas: balance,  endurance, locomotion, strength, transferring, bathing, dressing, toileting and psychosocial support 5. Can the patient actively participate in an intensive therapy program of at least 3 hrs of therapy per day at least 5 days per week? Yes 6. The potential for patient to make measurable gains while on inpatient rehab is excellent 7. Anticipated functional outcomes upon discharge from inpatient rehab are modified independent  with PT, modified independent with OT, n/a with SLP. 8. Estimated rehab length of stay to reach the above functional goals is: 7-d 9. Anticipated D/C setting: Home 10. Anticipated post D/C treatments: Outpatient therapy 11. Overall Rehab/Functional Prognosis: excellent  RECOMMENDATIONS: This patient's condition is appropriate for continued rehabilitative care in the following setting: CIR once stroke workup completed and pt is off nicardipine Patient has agreed to participate in recommended program. Yes Note that insurance prior authorization may be required for reimbursement for recommended care.  Comment: Need to monitor BP off nicardipine to see if pt can be managed off IV meds  "I have personally performed a face to face diagnostic evaluation of this patient.  Additionally, I have reviewed and concur with the physician assistant's documentation above." Charlett Blake M.D. [facility] FAAPM&R (Sports Med, Neuromuscular Med) Diplomate Am Board of Electrodiagnostic Med  Elizabeth Sauer 06/06/2017          Revision History                        Routing History

## 2017-06-08 ENCOUNTER — Inpatient Hospital Stay (HOSPITAL_COMMUNITY): Payer: Managed Care, Other (non HMO) | Admitting: Occupational Therapy

## 2017-06-08 ENCOUNTER — Inpatient Hospital Stay (HOSPITAL_COMMUNITY): Payer: Managed Care, Other (non HMO)

## 2017-06-08 ENCOUNTER — Inpatient Hospital Stay (HOSPITAL_COMMUNITY): Payer: Managed Care, Other (non HMO) | Admitting: Physical Therapy

## 2017-06-08 DIAGNOSIS — I69354 Hemiplegia and hemiparesis following cerebral infarction affecting left non-dominant side: Secondary | ICD-10-CM

## 2017-06-08 DIAGNOSIS — N400 Enlarged prostate without lower urinary tract symptoms: Secondary | ICD-10-CM

## 2017-06-08 LAB — CBC WITH DIFFERENTIAL/PLATELET
Abs Immature Granulocytes: 0 10*3/uL (ref 0.0–0.1)
BASOS ABS: 0.1 10*3/uL (ref 0.0–0.1)
Basophils Relative: 1 %
EOS ABS: 0.2 10*3/uL (ref 0.0–0.7)
EOS PCT: 3 %
HEMATOCRIT: 42.2 % (ref 39.0–52.0)
HEMOGLOBIN: 14.1 g/dL (ref 13.0–17.0)
IMMATURE GRANULOCYTES: 1 %
LYMPHS ABS: 2 10*3/uL (ref 0.7–4.0)
LYMPHS PCT: 34 %
MCH: 28.5 pg (ref 26.0–34.0)
MCHC: 33.4 g/dL (ref 30.0–36.0)
MCV: 85.4 fL (ref 78.0–100.0)
Monocytes Absolute: 0.4 10*3/uL (ref 0.1–1.0)
Monocytes Relative: 7 %
NEUTROS PCT: 54 %
Neutro Abs: 3.2 10*3/uL (ref 1.7–7.7)
Platelets: 278 10*3/uL (ref 150–400)
RBC: 4.94 MIL/uL (ref 4.22–5.81)
RDW: 12.9 % (ref 11.5–15.5)
WBC: 5.8 10*3/uL (ref 4.0–10.5)

## 2017-06-08 LAB — COMPREHENSIVE METABOLIC PANEL
ALT: 19 U/L (ref 17–63)
AST: 21 U/L (ref 15–41)
Albumin: 3.6 g/dL (ref 3.5–5.0)
Alkaline Phosphatase: 73 U/L (ref 38–126)
Anion gap: 8 (ref 5–15)
BILIRUBIN TOTAL: 1.4 mg/dL — AB (ref 0.3–1.2)
BUN: 15 mg/dL (ref 6–20)
CO2: 25 mmol/L (ref 22–32)
Calcium: 9.2 mg/dL (ref 8.9–10.3)
Chloride: 108 mmol/L (ref 101–111)
Creatinine, Ser: 0.86 mg/dL (ref 0.61–1.24)
Glucose, Bld: 101 mg/dL — ABNORMAL HIGH (ref 65–99)
Potassium: 3.7 mmol/L (ref 3.5–5.1)
Sodium: 141 mmol/L (ref 135–145)
TOTAL PROTEIN: 6.7 g/dL (ref 6.5–8.1)

## 2017-06-08 NOTE — Evaluation (Signed)
Physical Therapy Assessment and Plan  Patient Details  Name: Robert Sandoval MRN: [id number] Date of Birth: [birth year]  PT Diagnosis: Ataxia, Difficulty walking, Hemiparesis non-dominant and Impaired sensation Rehab Potential: Good ELOS: 7-9 days   Today's Date: 06/08/2017 PT Individual Time: 0930-1030 PT Individual Time Calculation (min): 60 min    Problem List:  Patient Active Problem List   Diagnosis Date Noted  . Benign prostatic hyperplasia   . Hemiparesis affecting left side as late effect of stroke ([place])   . Basal ganglia infarction ([place]) 06/07/2017  . 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 ([place])   . Gait disturbance, post-stroke   . Hyperlipidemia   . Acute ischemic stroke ([place]) - R basal ganglia s/p IV tPA 06/05/2017  . Prostate nodule 10/30/2016  . Essential hypertension 08/05/2016  . External hemorrhoids without complication 83/41/9622  . Melanosis of colon 11/23/2015  . Diverticulosis of colon without hemorrhage 11/23/2015  . Internal hemorrhoids 11/23/2015  . Essential hypertension, benign 09/14/2014    Past Medical History:  Past Medical History:  Diagnosis Date  . Allergy   . BPH (benign prostatic hyperplasia)    followed by [facility]  . Hyperlipidemia   . Hypertension   . Low back pain radiating to left lower extremity    sees Dr. Tonita Cong   Past Surgical History: History reviewed. No pertinent surgical history.  Assessment & Plan Clinical Impression: Patient is a 57 y.o. year old male with history of HTN, hyperlipidemia, LBP with LLE radiculopathy;who was admitted on 06/05/17 with left sided weakness and elevated SBP 190s. CT head negative and he receive tPA. CTA head/neck done was negative for large vessel occlusion. UDS negative. MRI brain done revealing acute right basal ganglia/CR infarct and acute tiny right occipital infarct in watershed territory. 2D echo showed EF 60-65% with no wall abnormality and mild  to moderate AVR. Dr. 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 Lisinopril increased to bid for tighter control and lipitor added with LDL goal <70. Patient with functional deficits due to left sided weakness with sensory deficits. CIR recommended for follow up therapy.   Patient transferred to CIR on 06/07/2017 .   Patient currently requires independent  with mobility secondary to muscle weakness, ataxia and decreased coordination and decreased standing balance, decreased postural control and decreased balance strategies.  Prior to hospitalization, patient was independent  with mobility and lived with Spouse in a [place] home.  Home access is 3Stairs to enter.  Patient will benefit from skilled PT intervention to maximize safe functional mobility, minimize fall risk and decrease caregiver burden for planned discharge intermittent supervision.  Anticipate patient will benefit from follow up OP at discharge.  PT - End of Session Activity Tolerance: Tolerates 30+ min activity with multiple rests Endurance Deficit: Yes PT Assessment Rehab Potential (ACUTE/IP ONLY): Good PT Patient demonstrates impairments in the following area(s): Balance;Behavior;Endurance;Motor;Nutrition;Pain;Perception;Safety;Sensory;Skin Integrity PT Transfers Functional Problem(s): Bed Mobility;Bed to Chair;Car;Furniture;Floor PT Locomotion Functional Problem(s): Ambulation;Wheelchair Mobility;Stairs PT Plan PT Intensity: Minimum of 1-2 x/day ,45 to 90 minutes PT Frequency: 5 out of 7 days PT Duration Estimated Length of Stay: 7-9 days PT Treatment/Interventions: Ambulation/gait training;Disease management/prevention;Pain management;Stair training;Visual/perceptual remediation/compensation;DME/adaptive equipment instruction;Balance/vestibular training;Patient/family education;Wheelchair propulsion/positioning;Therapeutic  Activities;Therapeutic Exercise;Psychosocial support;Cognitive remediation/compensation;Community reintegration;Functional mobility training;UE/LE Strength taining/ROM;Neuromuscular re-education;Splinting/orthotics;Discharge planning;UE/LE Coordination activities PT Transfers Anticipated Outcome(s): Mod I PT Locomotion Anticipated Outcome(s): Mod I PT Recommendation Follow Up Recommendations: Home health PT Patient destination: Home  Equipment Details: tbd  Skilled Therapeutic Intervention Evaluation completed (see details above and below) with education on PT POC and goals and individual treatment initiated with focus on functional transfers, ambulation, and balance. Pt donned shoes and socks while seated EOB with supervision. Pt ambulated into bathroom with RW and min assist. Pt transported to the gym. Pt performed car transfer stand pivot with min assist. Pt ambulated x 150 ft without AD, mod assist, scissoring and verbal cues for increased step length. Pt ascended/descended 12 steps with B handrails min assist, reciprocal pattern. Pt participated in berg balance test as detailed below, 40/56 and discussed results with pt. Pt ambulated back to room with RW and min assist, x 200 ft. Pt left seated in w/c with wife present.    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[employer]) Leisure: Hobbies-yes (Comment) Comments: enjoys walking Cognition Overall  Cognitive Status: Within Functional Limits for tasks assessed Arousal/Alertness: Awake/alert Orientation Level: Oriented X4 Selective Attention: Appears intact Memory: Appears intact Awareness: Appears intact Problem Solving: Appears intact Safety/Judgment: Appears intact Sensation Sensation Light Touch: Appears Intact Proprioception: Appears Intact Additional Comments: sensation intact B LEs, pt reports numbness to L arm and L side of face Coordination Gross Motor Movements are Fluid and Coordinated: No Fine Motor Movements are Fluid and Coordinated: No Coordination and Movement Description: ataxic L LE Heel Shin Test: impaired, slow and uncoordinated L LE Motor  Motor Motor: Ataxia;Hemiplegia Motor - Skilled Clinical Observations: mild R hemiparesis   Trunk/Postural Assessment  Cervical Assessment Cervical Assessment: Within Functional Limits Thoracic Assessment Thoracic Assessment: Within Functional Limits Postural Control Postural Control: Deficits on evaluation Protective Responses: impaired  Balance Balance Balance Assessed: (P) Yes Standardized Balance Assessment Standardized Balance Assessment: Berg Balance Test Berg Balance Test Sit to Stand: Able to stand  independently using hands Standing Unsupported: Able to stand 2 minutes with supervision Sitting with Back Unsupported but Feet Supported on Floor or Stool: Able to sit safely and securely 2 minutes Stand to Sit: Sits safely with minimal use of hands Transfers: Able to transfer safely, definite need of hands Standing Unsupported with Eyes Closed: Able to stand 10 seconds safely Standing Ubsupported with Feet Together: Able to place feet together independently and stand for 1 minute with supervision From Standing, Reach Forward with Outstretched Arm: Can reach forward >12 cm safely (5") From Standing Position, Pick up Object from Floor: Able to pick up shoe, needs supervision From Standing Position, Turn to  Look Behind Over each Shoulder: Looks behind from both sides and weight shifts well Turn 360 Degrees: Able to turn 360 degrees safely but slowly Standing Unsupported, Alternately Place Feet on Step/Stool: Able to complete >2 steps/needs minimal assist Standing Unsupported, One Foot in Front: Loses balance while stepping or standing Standing on One Leg: Able to lift leg independently and hold 5-10 seconds(SLS on R LE 8 sec, SLS on L LE 2 sec) Total Score: 40 Static Sitting Balance Static Sitting - Level of Assistance: 5: Stand by assistance Dynamic Sitting Balance Dynamic Sitting - Level of Assistance: 5: Stand by assistance Static  Standing Balance Static Standing - Level of Assistance: 4: Min assist Dynamic Standing Balance Dynamic Standing - Level of Assistance: 3: Mod assist(without AD) Extremity Assessment  RLE Assessment RLE Assessment: Within Functional Limits LLE Assessment LLE Assessment: Exceptions to WFL(grossly 3+/5 throughout)   See Function Navigator for Current Functional Status.   Refer to Care Plan for Long Term Goals  Recommendations for other services: None   Discharge Criteria: Patient will be discharged from PT if patient refuses treatment 3 consecutive times without medical reason, if treatment goals not met, if there is a change in medical status, if patient makes no progress towards goals or if patient is discharged from hospital.  The above assessment, treatment plan, treatment alternatives and goals were discussed and mutually agreed upon: by patient and by family  Netta Corrigan, PT, DPT 06/08/2017, 10:31 AM

## 2017-06-08 NOTE — Progress Notes (Addendum)
[facility]IC[facility]ILIT[facility]     PROGRESS NOTE  Subjective/Complaints:  Patient seen sitting up in bed this morning. He states he slept well overnight. He states he would like to get ready for the day. He is referred for therapies.  ROS: denies CP, SOB, nausea, vomiting, diarrhea.  Objective: Vital Signs: Blood pressure 95/61, pulse 66, temperature 98.2 F (36.8 C), temperature source Oral, resp. rate 18, SpO2 96 %. No results found. Recent Labs    06/07/17 0628 06/08/17 0609  WBC 6.7 5.8  HGB 14.6 14.1  HCT 44.4 42.2  PLT 282 278   Recent Labs    06/07/17 0628 06/08/17 0609  NA 139 141  K 4.1 3.7  CL 108 108  GLUCOSE 107* 101*  BUN 16 15  CREATININE 0.87 0.86  CALCIUM 9.3 9.2   CBG (last 3)  No results for input(s): GLUCAP in the last 72 hours.  Wt Readings from Last 3 Encounters:  06/06/17 72.2 kg (159 lb 2.8 oz)  08/05/16 72.6 kg (160 lb)  07/13/15 75.3 kg (166 lb)    Physical Exam:  BP 95/61 (BP Location: Right Arm)   Pulse 66   Temp 98.2 F (36.8 C) (Oral)   Resp 18   SpO2 96%   General: No acute distress. Well-developed. Well-nourished. HENT: Normocephalic. Atraumatic. Eyes: EOMI. No discharge. Heart: Regular rate and rhythm. No JVD. Lungs: Clear to auscultation, breathing unlabored Abdomen: Positive bowel sounds, nondistended Musculoskeletal: No edema or tenderness in extremities Neurologic: alert and oriented Motor: 5/5 in R deltoid, bicep, tricep, grip, hip flexor, knee extensors, ankle dorsiflexor and plantar flexor 4+/5 left deltoid bicep tricep grip  4/5 left hip flexor, knee extensor, ankle dorsiflexor Sensation subjectively diminished to light touch left upper extremity Skin: No evidence of breakdown, no evidence of rash Psych: Mood and affect are appropriate  Assessment/Plan: 1. Functional deficits secondary to right basal ganglia/corona radiata and occipital infarcts which require 3+ hours per day of interdisciplinary  therapy in a comprehensive inpatient rehab setting. Physiatrist is providing close team supervision and 24 hour management of active medical problems listed below. Physiatrist and rehab team continue to assess barriers to discharge/monitor patient progress toward functional and medical goals.  Function:  Bathing Bathing position      Bathing parts      Bathing assist        Upper Body Dressing/Undressing Upper body dressing                    Upper body assist        Lower Body Dressing/Undressing Lower body dressing                                  Lower body assist        Toileting Toileting   Toileting steps completed by patient: Performs perineal hygiene Toileting steps completed by helper: Adjust clothing prior to toileting, Adjust clothing after toileting Toileting Assistive Devices: Grab bar or rail  Toileting assist Assist level: Touching or steadying assistance (Pt.75%)   Transfers Chair/bed Clinical biochemist          Cognition Comprehension Comprehension assist level: Follows basic conversation/direction with no assist  Expression Expression assist level: Expresses basic needs/ideas: With no assist  Technical sales engineer Social  Interaction assist level: Interacts appropriately 90% of the time - Needs monitoring or encouragement for participation or interaction.  Problem Solving Problem solving assist level: Solves basic 90% of the time/requires cueing < 10% of the time  Memory Memory assist level: Recognizes or recalls 90% of the time/requires cueing < 10% of the time    Medical Problem List and Plan: 1.   Left hemiparesis  secondary to right basal ganglia/corona radiata and occipital infarcts  Begin CIR  Notes reviewed, images reviewed, labs reviewed 2. DVT Prophylaxis/Anticoagulation: Pharmaceutical:Lovenox 3.LBP/Pain Management:Resumed gabapentin to help with LLE  radiculopathy. 4. Mood:LCSW to follow for evaluation and support. 5. Neuropsych: This patientiscapable of making decisions on hisown behalf. 6.Skin/Wound Care:routine pressure relief measures. 7. Fluids/Electrolytes/Nutrition:  BMP within acceptable range on 5/24 8. HTN: Monitor BP bid. Continue   Lisinopril bid.  Labile at present with relatively low values, we'll monitor with increased mobility and consider decreasing lisinopril if necessary 9. Dyslipidemia: On Lipitor now. 10. Constipation: scheduled miralax to bid.  11. BPH: On flomax.   LOS (Days) 1 A FACE TO FACE EVALUATION WAS PERFORMED  Araya Roel Lorie Phenix 06/08/2017 10:19 AM

## 2017-06-08 NOTE — Progress Notes (Signed)
Physical Therapy Session Note  Patient Details  Name: Robert Sandoval MRN: [id number] Date of Birth: 25-Nov-1960  Today's Date: 06/08/2017 PT Individual Time: 1500-1600 PT Individual Time Calculation (min): 60 min   Short Term Goals: Week 1:  PT Short Term Goal 1 (Week 1): STG=LTG due to ELOS  Skilled Therapeutic Interventions/Progress Updates:   Pt in w/c and agreeable to therapy, pain as detailed below. Session focused on functional ambulation, LLE NMR, and standing balance. Ambulated to/from bathroom to have continent void, supervision for LE garment management, pericare, and functional standing balance. Ambulated to/from therapy gym w/ close supervision using RW and verbal cues for gait pattern. Performed LE strengthening and NMR exercises in standing (intermittent UE support) including partial knee bends 2x10, knee marches 2x10, heel rocks 2x10, and standing hip abduction 2x10. Visual cues for targets w/ LLE movement. Emphasis on L hip and pelvic stability in single leg stance activities as pt has decreased L hip abduction strength evidenced during gait. Performed dynamic standing balance tasks on both compliant and firm surfaces w/ emphasis on anterior weight shifting. Tasks performed w/ close supervision w/o UE support. Returned to room and ended session in w/c, call bell within reach and all needs met.   Therapy Documentation Precautions:  Precautions Precautions: Fall Precaution Comments: BP< 180/105 Restrictions Weight Bearing Restrictions: No Vital Signs: Therapy Vitals Temp: 98 F (36.7 C) Temp Source: Oral Pulse Rate: 72 Resp: 18 BP: 108/78 Patient Position (if appropriate): Lying Oxygen Therapy SpO2: 96 % O2 Device: Room Air Pain:    See Function Navigator for Current Functional Status.   Therapy/Group: Individual Therapy  Yohan Samons K Arnette 06/08/2017, 4:01 PM

## 2017-06-08 NOTE — Evaluation (Signed)
Occupational Therapy Assessment and Plan  Patient Details  Name: Robert Sandoval MRN: [id number] Date of Birth: 1960-06-11  OT Diagnosis: acute pain, hemiplegia affecting non-dominant side and muscle weakness (generalized) Rehab Potential: Rehab Potential (ACUTE ONLY): Good ELOS: 7 days   Today's Date: 06/08/2017 OT Individual Time: 2707-8675 OT Individual Time Calculation (min): 71 min     Problem List:  Patient Active Problem List   Diagnosis Date Noted  . Benign prostatic hyperplasia   . Hemiparesis affecting left side as late effect of stroke ([place])   . Basal ganglia infarction ([place]) 06/07/2017  . 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 ([place])   . Gait disturbance, post-stroke   . Hyperlipidemia   . Acute ischemic stroke ([place]) - R basal ganglia s/p IV tPA 06/05/2017  . Prostate nodule 10/30/2016  . Essential hypertension 08/05/2016  . External hemorrhoids without complication 44/92/0100  . Melanosis of colon 11/23/2015  . Diverticulosis of colon without hemorrhage 11/23/2015  . Internal hemorrhoids 11/23/2015  . Essential hypertension, benign 09/14/2014    Past Medical History:  Past Medical History:  Diagnosis Date  . Allergy   . BPH (benign prostatic hyperplasia)    followed by [facility]  . Hyperlipidemia   . Hypertension   . Low back pain radiating to left lower extremity    sees Dr. Tonita Cong   Past Surgical History: History reviewed. No pertinent surgical history.  Assessment & Plan Clinical Impression: Patient is a 57 y.o. right handedmaleof [place] descentwith history of HTN, hyperlipidemia, LBP with LLE radiculopathy;who was admitted on 06/05/17 with left sided weakness and elevated SBP 190s. CT head negative and he receive tPA. CTA head/neck done was negative for large vessel occlusion. UDS negative. MRI brain done revealing acute right basal ganglia/CR infarct and acute tiny right occipital infarct in watershed  territory. 2D echo showed EF 60-65% with no wall abnormality and mild to moderate AVR. Dr. 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 Lisinopril increased to bid for tighter control and lipitor added with LDL goal <70. Patient with functional deficits due to left sided weakness with sensory deficits. CIR recommended for follow up therapy.    Patient transferred to CIR on 06/07/2017 .    Patient currently requires min with basic self-care skills secondary to muscle weakness, impaired timing and sequencing, unbalanced muscle activation, ataxia and decreased coordination and decreased standing balance, decreased postural control and decreased balance strategies.  Prior to hospitalization, patient could complete ADLs with independent .  Patient will benefit from skilled intervention to increase independence with basic self-care skills prior to discharge home with care partner.  Anticipate patient will require intermittent supervision and follow up outpatient.  OT - End of Session Activity Tolerance: Tolerates 30+ min activity with multiple rests Endurance Deficit: Yes Endurance Deficit Description: required frequent rest breaks OT Assessment Rehab Potential (ACUTE ONLY): Good OT Patient demonstrates impairments in the following area(s): Balance;Endurance;Motor;Pain;Safety OT Basic ADL's Functional Problem(s): Bathing;Dressing;Toileting OT Advanced ADL's Functional Problem(s): Simple Meal Preparation OT Transfers Functional Problem(s): Toilet;Tub/Shower OT Additional Impairment(s): Fuctional Use of Upper Extremity OT Plan OT Intensity: Minimum of 1-2 x/day, 45 to 90 minutes OT Frequency: 5 out of 7 days OT Duration/Estimated Length of Stay: 7 days OT Treatment/Interventions: Medical illustrator training;Community reintegration;Discharge planning;Disease mangement/prevention;DME/adaptive equipment  instruction;Functional mobility training;Neuromuscular re-education;Pain management;Patient/family education;Psychosocial support;Self Care/advanced ADL retraining;Therapeutic Activities;Therapeutic Exercise;UE/LE Strength taining/ROM;UE/LE Coordination activities OT Basic Self-Care Anticipated Outcome(s): Mod I OT  Toileting Anticipated Outcome(s): Mod I OT Bathroom Transfers Anticipated Outcome(s): Mod I OT Recommendation Recommendations for Other Services: Therapeutic Recreation consult Therapeutic Recreation Interventions: Outing/community reintergration Patient destination: Home Follow Up Recommendations: Outpatient OT Equipment Recommended: To be determined   Skilled Therapeutic Intervention OT eval completed with discussion of rehab process, OT purpose, POC, ELOS, and goals.  ADL assessment completed with bathing and dressing at sit > stand level in room shower.  Min assist ambulation and transfer into room shower with RW.  Therapist providing min guard during bathing when standing and min cues for balance.  Pt completed dressing at sit > stand level with min assist when standing.  Pt required increased time to don socks and shoes.  Completed tub/shower transfer in ADL tubroom, educating on tub transfer bench vs shower seat, pt reporting having a bench at home that he could use for shower.  Also had pt step over tub ledge, requiring min assist due to not utilizing grab bars to simulate home setup. Educated pt on potential use of suction cup grab bar for steady assist as well.  Recommend tub bench at this time for increased independence and safety with transfers.  Completed 9 hole peg test (Rt: 31 sec and Lt: 43 sec) and Box of blocks assessment (Rt 40 blocks and Lt 33 blocks).  Discussed noted overshooting with Lt hand with 9 hole peg test and decreased strength with grasp/pinch.  OT Evaluation Precautions/Restrictions  Precautions Precautions: Fall Precaution Comments: BP<  180/105 Restrictions Weight Bearing Restrictions: No Pain Pain Assessment Pain Scale: 0-10 Pain Score: 5  Pain Type: Neuropathic pain Pain Location: Leg Pain Orientation: Left Pain Descriptors / Indicators: Tingling;Constant Pain Intervention(s): RN made aware;Shower Home Living/Prior Functioning Home Living Available Help at Discharge: Family Type of Home: Apartment Home Access: Stairs to enter Entrance Stairs-Number of Steps: 3 Entrance Stairs-Rails: Left Home Layout: One level Bathroom Shower/Tub: Tub/shower unit Bathroom Toilet: Standard  Lives With: Spouse IADL History Homemaking Responsibilities: Yes Meal Prep Responsibility: Secondary Prior Function Level of Independence: Independent with basic ADLs, Independent with gait  Able to Take Stairs?: Yes Driving: Yes 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[employer]) Leisure: Hobbies-yes (Comment) Comments: enjoys walking ADL  See Function Navigator Vision Baseline Vision/History: Wears glasses Wears Glasses: At all times Patient Visual Report: No change from baseline Vision Assessment?: Yes Eye Alignment: Within Functional Limits Ocular Range of Motion: Within Functional Limits Alignment/Gaze Preference: Within Defined Limits Tracking/Visual Pursuits: Able to track stimulus in all quads without difficulty Saccades: Within functional limits Convergence: Within functional limits Visual Fields: No apparent deficits Perception  Perception: Within Functional Limits Praxis Praxis: Intact Cognition Overall Cognitive Status: Within Functional Limits for tasks assessed Arousal/Alertness: Awake/alert Orientation Level: Person;Place;Situation Person: Oriented Place: Oriented Situation: Oriented Year: 2019 Month: May Day of Week: (Thursday) Memory: Appears intact Immediate Memory Recall: Sock;Blue;Bed Memory Recall: Sock;Blue;Bed Memory Recall Sock: Without Cue Memory Recall Blue: Without Cue Memory Recall  Bed: Without Cue Attention: Alternating Selective Attention: Appears intact Awareness: Appears intact Problem Solving: Appears intact Safety/Judgment: Appears intact Sensation Sensation Light Touch: Appears Intact(reports "different" on Lt but sensation intact) Proprioception: Appears Intact Additional Comments: pt reports "different" with numbness/tingling in Lt arm but light touch intact Coordination Gross Motor Movements are Fluid and Coordinated: No Fine Motor Movements are Fluid and Coordinated: No Coordination and Movement Description: ataxic L LE Finger Nose Finger Test: mild ataxia LUE Heel Shin Test: impaired, slow and uncoordinated L LE 9 Hole Peg Test: Rt: 31 seconds and Lt: 43   seconds Motor  Motor Motor: Ataxia;Hemiplegia Motor - Skilled Clinical Observations: mild R hemiparesis  Mobility     Trunk/Postural Assessment  Cervical Assessment Cervical Assessment: Within Functional Limits Thoracic Assessment Thoracic Assessment: Within Functional Limits Postural Control Postural Control: Deficits on evaluation Protective Responses: impaired  Balance Balance Balance Assessed: (P) Yes Standardized Balance Assessment Standardized Balance Assessment: Berg Balance Test Berg Balance Test Sit to Stand: Able to stand  independently using hands Standing Unsupported: Able to stand 2 minutes with supervision Sitting with Back Unsupported but Feet Supported on Floor or Stool: Able to sit safely and securely 2 minutes Stand to Sit: Sits safely with minimal use of hands Transfers: Able to transfer safely, definite need of hands Standing Unsupported with Eyes Closed: Able to stand 10 seconds safely Standing Ubsupported with Feet Together: Able to place feet together independently and stand for 1 minute with supervision From Standing, Reach Forward with Outstretched Arm: Can reach forward >12 cm safely (5") From Standing Position, Pick up Object from Floor: Able to pick up shoe,  needs supervision From Standing Position, Turn to Look Behind Over each Shoulder: Looks behind from both sides and weight shifts well Turn 360 Degrees: Able to turn 360 degrees safely but slowly Standing Unsupported, Alternately Place Feet on Step/Stool: Able to complete >2 steps/needs minimal assist Standing Unsupported, One Foot in Front: Loses balance while stepping or standing Standing on One Leg: Able to lift leg independently and hold 5-10 seconds(SLS on R LE 8 sec, SLS on L LE 2 sec) Total Score: 40 Static Sitting Balance Static Sitting - Level of Assistance: 5: Stand by assistance Dynamic Sitting Balance Dynamic Sitting - Level of Assistance: 5: Stand by assistance Static Standing Balance Static Standing - Level of Assistance: 4: Min assist Dynamic Standing Balance Dynamic Standing - Level of Assistance: 3: Mod assist(without AD) Extremity/Trunk Assessment RUE Assessment RUE Assessment: Within Functional Limits LUE Assessment LUE Assessment: Within Functional Limits(ROM WFL, strength grossly 4/5, loose gross grasp)   See Function Navigator for Current Functional Status.   Refer to Care Plan for Long Term Goals  Recommendations for other services: Therapeutic Recreation  Outing/community reintegration   Discharge Criteria: Patient will be discharged from OT if patient refuses treatment 3 consecutive times without medical reason, if treatment goals not met, if there is a change in medical status, if patient makes no progress towards goals or if patient is discharged from hospital.  The above assessment, treatment plan, treatment alternatives and goals were discussed and mutually agreed upon: by patient  HOXIE, SARAH 06/08/2017, 12:51 PM  

## 2017-06-08 NOTE — Progress Notes (Signed)
Social Work  Social Work Assessment and Plan  Patient Details  Name: Robert Sandoval MRN: [id number] Date of Birth: Jan 28, 1960  Today's Date: 06/08/2017  Problem List:  Patient Active Problem List   Diagnosis Date Noted  . Benign prostatic hyperplasia   . Hemiparesis affecting left side as late effect of stroke ([place])   . Basal ganglia infarction ([place]) 06/07/2017  . 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 Gait disturbance, post-stroke   . Hyperlipidemia   . Acute ischemic stroke ([place]) - R basal ganglia s/p IV tPA 06/05/2017  . Prostate nodule 10/30/2016  . Essential hypertension 08/05/2016  . External hemorrhoids without complication 69/67/8938  . Melanosis of colon 11/23/2015  . Diverticulosis of colon without hemorrhage 11/23/2015  . Internal hemorrhoids 11/23/2015  . Essential hypertension, benign 09/14/2014   Past Medical History:  Past Medical History:  Diagnosis Date  . Allergy   . BPH (benign prostatic hyperplasia)    followed by [facility]  . Hyperlipidemia   . Hypertension   . Low back pain radiating to left lower extremity    sees Dr. Tonita Cong   Past Surgical History: History reviewed. No pertinent surgical history. Social History:  reports that he has never smoked. He has never used smokeless tobacco. He reports that he drinks alcohol. He reports that he does not use drugs.  Family / Support Systems Marital Status: Married Patient Roles: Spouse, Parent Spouse/Significant Other: wife, Dacen Frayre (speaks Spanish) @ (C) [phone] Children: son living in [place] Other Supports: brother, Jacqulyn Bath lives across the street from patient and works 2nd shift;  brother, Imer @ (C) [phone] Anticipated Caregiver: Wife and Brother Ability/Limitations of Caregiver: both can physically assist if needed Caregiver Availability: 24/7 Family Dynamics: Pt describes family as very supportive and able to assist IF  needed.  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  Both wife and son are bedside and very involved and supportive.  Pt denies any emotional distress.  Feels he is making good progress and optimistic about being able to return to his job. Recent Psychosocial Issues: None Pyschiatric History: None Substance Abuse History: None  Patient / Family Perceptions, Expectations & Goals Pt/Family understanding of illness & functional limitations: Pt and family with good, general understanding of his stroke and resulting functional limitations/ need for CIR.  Pt reports that his understanding is that stroke likely due to BP issues. Premorbid pt/family roles/activities: Pt was completely independent and working f/t Anticipated changes in roles/activities/participation: Little to no change anticipated if pt able to reach mod ind goals. Pt/family expectations/goals: "I hope I will be able to go back to work."  [employer]: None Premorbid Home Care/DME Agencies: None Transportation available at discharge: yes  Discharge Planning Living Arrangements: Spouse/significant other Support Systems: Spouse/significant other, Children, Other relatives Type of Residence: Private residence Insurance underwriter Resources: Multimedia programmer (specify)([employer]) Financial  Resources: Employment Financial Screen Referred: No Living Expenses: Higher education careers adviser Management: Patient  Does the patient have any problems obtaining your medications?: No Home Management: pt and wife share responsibilities. Patient/Family Preliminary Plans: Pt to d/c home with wife and family able to provide up to 24/7 support, however, goals being set for mod ind. Social Work Anticipated Follow Up Needs: HH/OP  Clinical Impression Very pleasant gentleman here following admit for CVA.  Making good recovery and team anticipating short LOS and mod ind goals.  Good family support and could provide 24/7 if needed.  No s/s of emotional distress, however, will monitor.  SW to follow for support and d/c planning needs.  Rianna Lukes 06/08/2017, 4:45 PM

## 2017-06-09 ENCOUNTER — Inpatient Hospital Stay (HOSPITAL_COMMUNITY): Payer: Managed Care, Other (non HMO)

## 2017-06-09 ENCOUNTER — Inpatient Hospital Stay (HOSPITAL_COMMUNITY): Payer: Managed Care, Other (non HMO) | Admitting: Physical Therapy

## 2017-06-09 MED ORDER — LISINOPRIL 10 MG PO TABS
10.0000 mg | ORAL_TABLET | Freq: Two times a day (BID) | ORAL | Status: DC
  Administered 2017-06-09 – 2017-06-12 (×6): 10 mg via ORAL
  Filled 2017-06-09 (×6): qty 1

## 2017-06-09 NOTE — Progress Notes (Signed)
Occupational Therapy Session Note  Patient Details  Name: Robert Sandoval MRN: [id number] Date of Birth: 28-Nov-1960  Today's Date: 06/09/2017 OT Individual Time: 2336-1224 OT Individual Time Calculation (min): 71 min    Short Term Goals: Week 1:  OT Short Term Goal 1 (Week 1): STG = LTGs due to ELOS  Skilled Therapeutic Interventions/Progress Updates:    1:1. Pt with no c/o pain and ready to start tx upon entering room. Pt participates in discussion related to ADL/IADL responsibilities at home such as making breakfast, cleaning, and doing laundry. Pt agreeable to participate in performance analysis of making coffee and changing sheets on a standard bed to assess motor and process skills. Pt completed both activities safely with supervision and discussed modification/RW management after each task. Pt demo diminished skills in the following areas: gripping, coordinating, manipulating, aligning, stabilizing, restoration, noticing and calibration. Pt and OT discussed deficits and compensatory strategies as well as energy conservation techniques. Exited session with pt seated in w/c in room, call light in reach and all needs met  Therapy Documentation Precautions:  Precautions Precautions: Fall Precaution Comments: BP< 180/105 Restrictions Weight Bearing Restrictions: No Other Treatments:    See Function Navigator for Current Functional Status.   Therapy/Group: Individual Therapy  Robert Sandoval 06/09/2017, 9:26 AM

## 2017-06-09 NOTE — Progress Notes (Signed)
Physical Therapy Session Note  Patient Details  Name: Robert Sandoval MRN: [id number] Date of Birth: 1960-01-30  Today's Date: 06/09/2017 PT Individual Time: 1300-1400 PT Individual Time Calculation (min): 60 min   Short Term Goals: Week 1:  PT Short Term Goal 1 (Week 1): STG=LTG due to ELOS  Skilled Therapeutic Interventions/Progress Updates:   Pt received sitting in [facility] and agreeable to PT  Gait training in with RW through hall 2x 237f with supervision assist from PT. Min cues throughout to improve heel strike on the L.   Variable gait training without AD to force use of LLE, forward/backward and side stepping x 4 each with min assist from PT. Weave through 4 cones x 4 with min assist from PT. Moderate cues from PT for increased step length on the L, improved heel contact on the L and increased stance time on the L to improve normalized gait pattern.  Gait training over unlevel surface x 10 ft with min assist from PT and min cues for improved weight shift over the L.   PT instructed pt in DGI; see below for details. (Pt demonstrates increased fall risk with score of 15/24; <19 indicates increased fall risk)  [facility] and all needs met.          Therapy Documentation Precautions:  Precautions Precautions: Fall Precaution Comments: BP< 180/105 Restrictions Weight Bearing Restrictions: No Pain:  denies.    Balance: Balance Balance Assessed: Yes Standardized Balance Assessment Standardized Balance Assessment: Dynamic Gait Index Dynamic Gait Index Level Surface: Mild Impairment Change in Gait Speed: Mild Impairment Gait with Horizontal Head Turns: Mild Impairment Gait with Vertical Head Turns: Moderate Impairment Gait and Pivot Turn: Mild Impairment Step Over Obstacle: Mild Impairment Step Around Obstacles: Mild Impairment Steps: Mild Impairment Total Score: 15   See Function Navigator for Current  Functional Status.   Therapy/Group: Individual Therapy  ALorie Phenix5/25/2019, 2:06 PM

## 2017-06-09 NOTE — Progress Notes (Signed)
[facility]IC[facility] & REHABILITATION     PROGRESS NOTE  Subjective/Complaints:   Walking with OT still has Left LE weakness, looks at feet, needs cues  ROS: denies CP, SOB, nausea, vomiting, diarrhea.  Objective: Vital Signs: Blood pressure 102/72, pulse 66, temperature 98 F (36.7 C), temperature source Oral, resp. rate 15, SpO2 97 %. No results found. Recent Labs    06/07/17 0628 06/08/17 0609  WBC 6.7 5.8  HGB 14.6 14.1  HCT 44.4 42.2  PLT 282 278   Recent Labs    06/07/17 0628 06/08/17 0609  NA 139 141  K 4.1 3.7  CL 108 108  GLUCOSE 107* 101*  BUN 16 15  CREATININE 0.87 0.86  CALCIUM 9.3 9.2   CBG (last 3)  No results for input(s): GLUCAP in the last 72 hours.  Wt Readings from Last 3 Encounters:  06/06/17 72.2 kg (159 lb 2.8 oz)  08/05/16 72.6 kg (160 lb)  07/13/15 75.3 kg (166 lb)    Physical Exam:  BP 102/72   Pulse 66   Temp 98 F (36.7 C) (Oral)   Resp 15   SpO2 97%   General: No acute distress. Well-developed. Well-nourished. HENT: Normocephalic. Atraumatic. Eyes: EOMI. No discharge. Heart: Regular rate and rhythm. No JVD. Lungs: Clear to auscultation, breathing unlabored Abdomen: Positive bowel sounds, nondistended Musculoskeletal: No edema or tenderness in extremities Neurologic: alert and oriented Motor: 5/5 in R deltoid, bicep, tricep, grip, hip flexor, knee extensors, ankle dorsiflexor and plantar flexor 4+/5 left deltoid bicep tricep grip  4/5 left hip flexor, knee extensor, ankle dorsiflexor Sensation subjectively diminished to light touch left upper extremity Skin: No evidence of breakdown, no evidence of rash Psych: Mood and affect are appropriate  Assessment/Plan: 1. 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 Physiatrist is providing close team supervision and 24 hour  management of active medical problems listed below. Physiatrist and rehab team continue to assess barriers to discharge/monitor patient progress toward functional and medical goals.  Function:  Bathing Bathing position   Position: Shower  Bathing parts Body parts bathed by patient: Right arm, Left arm, Chest, Front perineal area, Abdomen, Buttocks, Right upper leg, Left upper leg, Right lower leg, Left lower leg Body parts bathed by helper: Back  Bathing assist Assist Level: Touching or steadying assistance(Pt > 75%)      Upper Body Dressing/Undressing Upper body dressing   What is the patient wearing?: Pull over shirt/dress     Pull over shirt/dress - Perfomed by patient: Thread/unthread right sleeve, Thread/unthread left sleeve, Put head through opening, Pull shirt over trunk          Upper body assist Assist Level: Touching or steadying assistance(Pt > 75%)      Lower Body Dressing/Undressing Lower body dressing   What is the patient wearing?: Underwear, Pants, Socks, Shoes Underwear - Performed by patient: Thread/unthread right underwear leg, Thread/unthread left underwear leg, Pull underwear up/down   Pants- Performed by patient: Thread/unthread right pants leg, Thread/unthread left pants leg, Pull pants up/down       Socks - Performed by patient: Don/doff right sock, Don/doff left sock   Shoes - Performed by patient: Don/doff right shoe, Don/doff left shoe, Fasten right, Fasten left            Lower body assist Assist for lower body dressing: Touching or steadying assistance (Pt > 75%)  Toileting Toileting   Toileting steps completed by patient: Performs perineal hygiene Toileting steps completed by helper: Adjust clothing prior to toileting, Adjust clothing after toileting Toileting Assistive Devices: Grab bar or rail  Toileting assist Assist level: Touching or steadying assistance (Pt.75%)   Transfers Chair/bed transfer   Chair/bed transfer method:  Stand pivot Chair/bed transfer assist level: Touching or steadying assistance (Pt > 75%) Chair/bed transfer assistive device: Armrests, Medical sales representative     Max distance: 150 ft Assist level: Moderate assist (Pt 50 - 74%)   Wheelchair          Cognition Comprehension Comprehension assist level: Follows basic conversation/direction with no assist  Expression Expression assist level: Expresses basic needs/ideas: With no assist  Social Interaction Social Interaction assist level: Interacts appropriately with others - No medications needed.  Problem Solving Problem solving assist level: Solves basic problems with no assist  Memory Memory assist level: More than reasonable amount of time    Medical Problem List and Plan: 1.   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 DVT Prophylaxis/Anticoagulation: Pharmaceutical:Lovenox 3.LBP/Pain Management:Resumed gabapentin to help with LLE radiculopathy. 4. Mood:LCSW to follow for evaluation and support. 5. Neuropsych: This patientiscapable of making decisions on hisown behalf. 6.Skin/Wound Care:routine pressure relief measures. 7. Fluids/Electrolytes/Nutrition:  BMP within acceptable range on 5/24, good intake, d/c IV 8. HTN: Monitor BP bid. Continue   Lisinopril bid.   Vitals:   06/09/17 0436 06/09/17 0457  BP: 91/68 102/72  Pulse: 69 66  Resp: 15   Temp: 98 F (36.7 C)   SpO2: 97%   Controlled but on low side will reduce lisinopril to 10mg  BID 9. Dyslipidemia: On Lipitor now. 10. Constipation: scheduled miralax to bid.  11. BPH: On flomax.   LOS (Days) 2 A FACE TO FACE EVALUATION WAS PERFORMED  Charlett Blake 06/09/2017 8:26 AM

## 2017-06-09 NOTE — Progress Notes (Signed)
Occupational Therapy Session Note  Patient Details  Name: Robert Sandoval MRN: [id number] Date of Birth: 1960-01-24  Today's Date: 06/09/2017 OT Individual Time: 2761-4709 OT Individual Time Calculation (min): 57 min    Short Term Goals: Week 1:  OT Short Term Goal 1 (Week 1): STG = LTGs due to ELOS  Skilled Therapeutic Interventions/Progress Updates:    1:1. Pt requesting to shower. Pt ambulates to tub room with RW and supervision. Pt completes tub transfer with instruction to hold onto wall to stabilize while stepping over tub with supervision. Pt bathes at sit to stand level with supervision and VC for using LUE to wash most of body for NMR. Pt dresses UB in standing to challenge balance and LB sit to stand with supervision. Pt able to assume crouch/lunge down to ground to tie shoes with one knee lowered, switch legs and stand back up with supervision and VC to use sturdy objects to stabilize such as tub ledge as opposed to pulling on walker. Pt completes biodex balance activities of standing to maintain midline COG, maze activity and catch game to improve weight shifting and even weight bearing over B feet. Pt requires min facilitation at hips in beginning to demonstrate shifting weight across feet. Pt ambulates back to room in same manner as above. Exited session with pt seated in bed, call light in reach and all needs met.   Therapy Documentation Precautions:  Precautions Precautions: Fall Precaution Comments: BP< 180/105 Restrictions Weight Bearing Restrictions: No See Function Navigator for Current Functional Status.   Therapy/Group: Individual Therapy  Tonny Branch 06/09/2017, 4:50 PM

## 2017-06-10 ENCOUNTER — Inpatient Hospital Stay (HOSPITAL_COMMUNITY): Payer: Managed Care, Other (non HMO)

## 2017-06-10 NOTE — Progress Notes (Signed)
[facility] PHYSICAL MEDICINE & REHABILITATION     PROGRESS NOTE  Subjective/Complaints:   Back pain with standing, hx of sciatica 15yago  ROS: denies CP, SOB, nausea, vomiting, diarrhea.  Objective: Vital Signs: Blood pressure 101/69, pulse 62, temperature 98.1 F (36.7 C), temperature source Oral, resp. rate 16, SpO2 97 %. No results found. Recent Labs    06/08/17 0609  WBC 5.8  HGB 14.1  HCT 42.2  PLT 278   Recent Labs    06/08/17 0609  NA 141  K 3.7  CL 108  GLUCOSE 101*  BUN 15  CREATININE 0.86  CALCIUM 9.2   CBG (last 3)  No results for input(s): GLUCAP in the last 72 hours.  Wt Readings from Last 3 Encounters:  06/06/17 72.2 kg (159 lb 2.8 oz)  08/05/16 72.6 kg (160 lb)  07/13/15 75.3 kg (166 lb)    Physical Exam:  BP 101/69 (BP Location: Right Arm)   Pulse 62   Temp 98.1 F (36.7 C) (Oral)   Resp 16   SpO2 97%   General: No acute distress. Well-developed. Well-nourished. HENT: Normocephalic. Atraumatic. Eyes: EOMI. No discharge. Heart: Regular rate and rhythm. No JVD. Lungs: Clear to auscultation, breathing unlabored Abdomen: Positive bowel sounds, nondistended Musculoskeletal: No edema or tenderness in extremities, neg SLR, no pain with palpation of lumbar Neurologic: alert and oriented Motor: 5/5 in R deltoid, bicep, tricep, grip, hip flexor, knee extensors, ankle dorsiflexor and plantar flexor 4+/5 left deltoid bicep tricep grip  4/5 left hip flexor, knee extensor, ankle dorsiflexor Sensation subjectively diminished to light touch left upper extremity Skin: No evidence of breakdown, no evidence of rash Psych: Mood and affect are appropriate  Assessment/Plan: 1. 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 Physiatrist is providing close team supervision and 24 hour management of active medical  problems listed below. Physiatrist and rehab team continue to assess barriers to discharge/monitor patient progress toward functional and medical goals.  Function:  Bathing Bathing position   Position: Shower  Bathing parts Body parts bathed by patient: Right arm, Left arm, Chest, Front perineal area, Abdomen, Buttocks, Right upper leg, Left upper leg, Right lower leg, Left lower leg Body parts bathed by helper: Back  Bathing assist Assist Level: Touching or steadying assistance(Pt > 75%)      Upper Body Dressing/Undressing Upper body dressing   What is the patient wearing?: Pull over shirt/dress     Pull over shirt/dress - Perfomed by patient: Thread/unthread right sleeve, Thread/unthread left sleeve, Put head through opening, Pull shirt over trunk          Upper body assist Assist Level: Touching or steadying assistance(Pt > 75%)      Lower Body Dressing/Undressing Lower body dressing   What is the patient wearing?: Underwear, Pants, Socks, Shoes Underwear - Performed by patient: Thread/unthread right underwear leg, Thread/unthread left underwear leg, Pull underwear up/down   Pants- Performed by patient: Thread/unthread right pants leg, Thread/unthread left pants leg, Pull pants up/down       Socks - Performed by patient: Don/doff right sock, Don/doff left sock   Shoes - Performed by patient: Don/doff right shoe, Don/doff left shoe, Fasten right, Fasten left            Lower body assist Assist for lower body dressing: Touching or steadying assistance (Pt > 75%)      Toileting Toileting  Toileting steps completed by patient: Performs perineal hygiene Toileting steps completed by helper: Adjust clothing prior to toileting, Adjust clothing after toileting Toileting Assistive Devices: Grab bar or rail  Toileting assist Assist level: Touching or steadying assistance (Pt.75%)   Transfers Chair/bed transfer   Chair/bed transfer method: Ambulatory Chair/bed transfer  assist level: Supervision or verbal cues Chair/bed transfer assistive device: Medical sales representative     Max distance: 215ft  Assist level: Supervision or verbal cues   Wheelchair          Cognition Comprehension Comprehension assist level: Follows basic conversation/direction with no assist  Expression Expression assist level: Expresses basic needs/ideas: With no assist  Social Interaction Social Interaction assist level: Interacts appropriately with others - No medications needed.  Problem Solving Problem solving assist level: Solves basic problems with no assist  Memory Memory assist level: More than reasonable amount of time    Medical Problem List and Plan: 1.   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 DVT Prophylaxis/Anticoagulation: Pharmaceutical:Lovenox 3.LBP/Pain Management:Resumed gabapentin to help with LLE radiculopathy. 4. Mood:LCSW to follow for evaluation and support. 5. Neuropsych: This patientiscapable of making decisions on hisown behalf. 6.Skin/Wound Care:routine pressure relief measures. 7. Fluids/Electrolytes/Nutrition:  BMP within acceptable range on 5/24, good intake, d/c IV 8. HTN: Monitor BP bid. Continue   Lisinopril bid.   Vitals:   06/09/17 2010 06/10/17 0516  BP: (!) 118/94 101/69  Pulse: 89 62  Resp: 20 16  Temp: 97.8 F (36.6 C) 98.1 F (36.7 C)  SpO2: 93% 97%  Controlled but on low side will reduce lisinopril to 10mg  BID-monitor 9. Dyslipidemia: On Lipitor now. 10. Constipation: scheduled miralax to bid. No BM recorded 11. BPH: On flomax.   LOS (Days) 3 A FACE TO FACE EVALUATION WAS PERFORMED  Charlett Blake 06/10/2017 8:30 AM

## 2017-06-10 NOTE — Progress Notes (Signed)
Occupational Therapy Session Note  Patient Details  Name: Robert Sandoval MRN: [id number] Date of Birth: 1960-11-08  Today's Date: 06/10/2017 OT Individual Time: 6122-4497 OT Individual Time Calculation (min): 25 min    Short Term Goals: Week 1:  OT Short Term Goal 1 (Week 1): STG = LTGs due to ELOS  Skilled Therapeutic Interventions/Progress Updates:    1:1. Pt requesting to walk this session. Pt ambulates with RW to dayroom with supervision and Vc for looking forward and longer strides. Pt plays 5 frames of wii bowling in standing with forced use of LUE to novel task. Pt with mild discoordination of gross motor movements to swing arm like bowling activity with 1 trial of HOH A. Exited sesion with pt seated in w/c and wife present in room  Therapy Documentation Precautions:  Precautions Precautions: Fall Precaution Comments: BP< 180/105 Restrictions Weight Bearing Restrictions: No General:   Vital Signs:   Pain: Pain Assessment Pain Scale: 0-10 Pain Score: 0-No pain  See Function Navigator for Current Functional Status.   Therapy/Group: Individual Therapy  Tonny Branch 06/10/2017, 9:44 AM

## 2017-06-11 ENCOUNTER — Inpatient Hospital Stay (HOSPITAL_COMMUNITY): Payer: Managed Care, Other (non HMO)

## 2017-06-11 ENCOUNTER — Inpatient Hospital Stay (HOSPITAL_COMMUNITY): Payer: Managed Care, Other (non HMO) | Admitting: Occupational Therapy

## 2017-06-11 ENCOUNTER — Inpatient Hospital Stay (HOSPITAL_COMMUNITY): Payer: Managed Care, Other (non HMO) | Admitting: Physical Therapy

## 2017-06-11 NOTE — Progress Notes (Signed)
Physical Therapy Note  Patient Details  Name: Josealfredo Adkins MRN: [id number] Date of Birth: 25-Oct-1960 Today's Date: 06/11/2017  1420-1450, 30 min individual tx Pain: L sciatic pain, hx PTA, instructed pt in self -stretching bil heel cords and hamstrings in standing at sink  Gait training without use of AD on level tile x 200' with 1 standing rest break as L hip flexors fatigued, min assist..  Sustained stretch bil hamstrings and heel cords, standing on wedge without UE support, x 2 minutes.  L LE fine motor control in sitting, rolling soccer ball under R foot, controlling ball for A<>P,R><L and circular movements.  Kicking ball in sitting and kicking cones in standing with RUE support, focusing on hip and knee flexion.   Gait to return to room 200" without a rest break, kicking Yoga block ( part of the distance to his room) with L foot,  with increasing power noted.  Pt demonstrated poor eccentric control of LLE after kicking, with entire LLE "wobbling" in the air as he returned it to the floor.  Pt left resting in bed with wife present, and needs at hand.  Pt requested that bed alarm not be set, as his wife has been cleared for ambulation to the BR with him.  See function navigator for current status.  Arica Bevilacqua 06/11/2017, 12:34 PM

## 2017-06-11 NOTE — Progress Notes (Signed)
Physical Therapy Session Note  Patient Details  Name: Robert Sandoval MRN: [id number] Date of Birth: 1960/04/25  Today's Date: 06/11/2017 PT Individual Time: 7494-4967 PT Individual Time Calculation (min): 54 min   Short Term Goals: Week 1:  PT Short Term Goal 1 (Week 1): STG=LTG due to ELOS  Skilled Therapeutic Interventions/Progress Updates: Pt presented in w/c agreeable to therapy. Session focused on balance activities. Ambulated no AD min guard to rehab gym. Per report, pt enjoys playing the guitar, introduced guitar to allow pt to practice fine motor skills. PTA encouraged pt to play while standing on Airex,  No significant sway or LOB. Participated in ball toss off rebounder on level tile, airex, and with single LE support on 4in step. Performed obstacle course no AD with min guard and verbal cues for increasing R foot clearance over thresholds. Participated in agility ladder step overs and lateral steps for RLE NMR. Returned to room with pt holding guitar with RUE only initially supervision down to min guard due to decreased foot clearance with RLE upon fatigue. Pt returned to room and remained at EOB to take rest prior to going to bathroom (wife present and signed off for bathroom transferr).      Therapy Documentation Precautions:  Precautions Precautions: Fall Precaution Comments: BP< 180/105 Restrictions Weight Bearing Restrictions: No General:   Vital Signs:   Pain:     See Function Navigator for Current Functional Status.   Therapy/Group: Individual Therapy  Sharin Altidor  Kawon Willcutt, PTA  06/11/2017, 12:14 PM

## 2017-06-11 NOTE — Progress Notes (Signed)
Physical Therapy Session Note  Patient Details  Name: Robert Sandoval MRN: [id number] Date of Birth: 1960/05/28  Today's Date: 06/11/2017 PT Individual Time: 1600-1630 PT Individual Time Calculation (min): 30 min   Short Term Goals: Week 1:  PT Short Term Goal 1 (Week 1): STG=LTG due to ELOS  Skilled Therapeutic Interventions/Progress Updates:    Pt seated EOB upon PT arrival, agreeable to therapy tx and denies pain. Pt ambulated from room<>gym this session >200 ft each direction without AD, min assist, verbal cues for L hip flexion and heel strike. Pt worked on dynamic standing balance and coordination in order to perform toe taps to colored cones, x 2 trials with min assist. Pt performed 2 x 10 L LE lateral step ups for strengthening and NMR, performed side stepping in each direction for dynamic standing balance and NMR. Pt ambulated back to room and left seated in care of wife.   Therapy Documentation Precautions:  Precautions Precautions: Fall Precaution Comments: BP< 180/105 Restrictions Weight Bearing Restrictions: No   See Function Navigator for Current Functional Status.   Therapy/Group: Individual Therapy  Netta Corrigan, PT, DPT 06/11/2017, 7:54 AM

## 2017-06-11 NOTE — Progress Notes (Signed)
Occupational Therapy Session Note  Patient Details  Name: Robert Sandoval MRN: [id number] Date of Birth: 23-Mar-1960  Today's Date: 06/11/2017 OT Individual Time: 1324-4010 OT Individual Time Calculation (min): 45 min    Short Term Goals: Week 1:  OT Short Term Goal 1 (Week 1): STG = LTGs due to ELOS  Skilled Therapeutic Interventions/Progress Updates:    Treatment session with focus on dynamic standing balance, balance reactions and BLE strengthening.  Pt received seated EOB with wife assisting with dressing.  Encouraged pt to attempt tasks prior to asking for assistance, as he has demonstrated ability to complete dressing tasks on eval.  Pt donned socks seated EOB and then donned shoes by crouching/lunging down to ground to tie shoes with one knee lowered, switched legs and stood back up with supervision.  Pt ambulated to therapy gym with RW and supervision, noted slight "limp" when ambulating - pt attributing to sciatic nerve pain.  Engaged in dynamic standing activity with ball toss in standing increasing challenge to tossing ball overhead.  Engaged in alternating toe taps to increase ability to pick up LLE as needed for stepping over tub ledge and with ambulation as well as alternating weight shift.  Completed 8 mins on Nustep on resistance level 3 for endurance, per pt request.  Returned to room ambulating > 300' with RW and supervision.  Therapy Documentation Precautions:  Precautions Precautions: Fall Precaution Comments: BP< 180/105 Restrictions Weight Bearing Restrictions: No Pain: Pain Assessment Pain Score: 0-No pain  See Function Navigator for Current Functional Status.   Therapy/Group: Individual Therapy  [name] 06/11/2017, 9:52 AM

## 2017-06-11 NOTE — Progress Notes (Signed)
[facility] PHYSICAL MEDICINE & REHABILITATION     PROGRESS NOTE  Subjective/Complaints:  Patient seen lying in bed this morning. He states he slept well overnight. He states he had a good weekend.  ROS: denies CP, SOB, nausea, vomiting, diarrhea.  Objective: Vital Signs: Blood pressure 106/73, pulse 65, temperature 98 F (36.7 C), temperature source Oral, resp. rate 18, SpO2 97 %. No results found. No results for input(s): WBC, HGB, HCT, PLT in the last 72 hours. No results for input(s): NA, K, CL, GLUCOSE, BUN, CREATININE, CALCIUM in the last 72 hours.  Invalid input(s): CO CBG (last 3)  No results for input(s): GLUCAP in the last 72 hours.  Wt Readings from Last 3 Encounters:  06/06/17 72.2 kg (159 lb 2.8 oz)  08/05/16 72.6 kg (160 lb)  07/13/15 75.3 kg (166 lb)    Physical Exam:  BP 106/73 (BP Location: Right Arm)   Pulse 65   Temp 98 F (36.7 C) (Oral)   Resp 18   SpO2 97%   General: No acute distress. Well-developed. Well-nourished. HENT: Normocephalic. Atraumatic. Eyes: EOMI. No discharge. Heart: RRR. No JVD. Lungs: Clear to auscultation, breathing unlabored Abdomen: Positive bowel sounds, nondistended Musculoskeletal: No edema or tenderness in extremities Neurologic: alert and oriented Motor: 5/5 in R deltoid, bicep, tricep, grip, hip flexor, knee extensors, ankle dorsiflexor and plantar flexor 4+/5 left deltoid bicep tricep grip (stable) 4/5 left hip flexor, knee extensor, ankle dorsiflexor (stable) Skin: No evidence of breakdown, no evidence of rash Psych: Mood and affect are appropriate  Assessment/Plan: 1. 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 Physiatrist is providing close team supervision and 24 hour management of active medical problems listed below. Physiatrist and rehab team continue to assess  barriers to discharge/monitor patient progress toward functional and medical goals.  Function:  Bathing Bathing position   Position: Shower  Bathing parts Body parts bathed by patient: Right arm, Left arm, Chest, Front perineal area, Abdomen, Buttocks, Right upper leg, Left upper leg, Right lower leg, Left lower leg Body parts bathed by helper: Back  Bathing assist Assist Level: Touching or steadying assistance(Pt > 75%)      Upper Body Dressing/Undressing Upper body dressing   What is the patient wearing?: Pull over shirt/dress     Pull over shirt/dress - Perfomed by patient: Thread/unthread right sleeve, Thread/unthread left sleeve, Put head through opening, Pull shirt over trunk          Upper body assist Assist Level: Touching or steadying assistance(Pt > 75%)      Lower Body Dressing/Undressing Lower body dressing   What is the patient wearing?: Underwear, Pants, Socks, Shoes Underwear - Performed by patient: Thread/unthread right underwear leg, Thread/unthread left underwear leg, Pull underwear up/down   Pants- Performed by patient: Thread/unthread right pants leg, Thread/unthread left pants leg, Pull pants up/down       Socks - Performed by patient: Don/doff right sock, Don/doff left sock   Shoes - Performed by patient: Don/doff right shoe, Don/doff left shoe, Fasten right, Fasten left            Lower body assist Assist for lower body dressing: Touching or steadying assistance (Pt > 75%)      Toileting Toileting   Toileting steps completed by patient: Adjust clothing prior to toileting, Performs perineal hygiene, Adjust clothing after toileting Toileting steps completed by helper: Adjust clothing prior to  toileting, Adjust clothing after toileting Toileting Assistive Devices: Grab bar or rail  Toileting assist Assist level: Touching or steadying assistance (Pt.75%)   Transfers Chair/bed transfer   Chair/bed transfer method: Ambulatory Chair/bed transfer  assist level: Supervision or verbal cues Chair/bed transfer assistive device: Medical sales representative     Max distance: 242ft  Assist level: Supervision or verbal cues   Wheelchair          Cognition Comprehension Comprehension assist level: Follows complex conversation/direction with no assist  Expression Expression assist level: Expresses complex ideas: With no assist  Social Interaction Social Interaction assist level: Interacts appropriately with others - No medications needed.  Problem Solving Problem solving assist level: Solves complex problems: Recognizes & self-corrects  Memory Memory assist level: Complete Independence: No helper    Medical Problem List and Plan: 1.   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 DVT Prophylaxis/Anticoagulation: Pharmaceutical:Lovenox 3.LBP/Pain Management:Resumed gabapentin to help with LLE radiculopathy. 4. Mood:LCSW to follow for evaluation and support. 5. Neuropsych: This patientiscapable of making decisions on hisown behalf. 6.Skin/Wound Care:routine pressure relief measures. 7. Fluids/Electrolytes/Nutrition:  BMP within acceptable range on 5/24 8. HTN: Monitor BP bid. Continue   Lisinopril bid, decreased to 10 mg twice a day on 5/25.   Vitals:   06/10/17 2056 06/11/17 0522  BP: 130/87 106/73  Pulse: 74 65  Resp: 18 18  Temp: 98 F (36.7 C) 98 F (36.7 C)  SpO2: 96% 97%   Slightly labile on 5/27 9. Dyslipidemia: On Lipitor now. 10. Constipation: scheduled miralax to bid.  11. BPH: On flomax.  Voiding without difficulty  LOS (Days) 4 A FACE TO FACE EVALUATION WAS PERFORMED  Robert Sandoval Robert Sandoval 06/11/2017 8:24 AM

## 2017-06-11 NOTE — Care Management (Signed)
Inpatient Rehabilitation Center Individual Statement of Services  Patient Name:  Robert Sandoval  Date:  06/11/2017  Welcome to the [facility].  Our goal is to provide you with an individualized program based on your diagnosis and situation, designed to meet your specific needs.  With this comprehensive rehabilitation program, you will be expected to participate in at least 3 hours of rehabilitation therapies Monday-Friday, with modified therapy programming on the weekends.  Your rehabilitation program will include the following services:  Physical Therapy (PT), Occupational Therapy (OT), 24 hour per day rehabilitation nursing, Case Management (Social Worker), Rehabilitation Medicine, Nutrition Services and Pharmacy Services  Weekly team conferences will be held on Wednesdays to discuss your progress.  Your Social Worker will talk with you frequently to get your input and to update you on team discussions.  Team conferences with you and your family in attendance may also be held.  Expected length of stay: 7 days    Overall anticipated outcome: modified independent  Depending on your progress and recovery, your program may change. Your Social Worker will coordinate services and will keep you informed of any changes. Your Social Worker's name and contact numbers are listed  below.  The following services may also be recommended but are not provided by the [facility] will be made to provide these services after discharge if needed.  Arrangements include referral to agencies that provide these services.  Your insurance has been verified to be:  [employer] Your primary doctor is:  Shea Stakes with [facility]  Pertinent information will be shared with your doctor and your insurance company.  Social Worker:  [place], [place] or ([phone]   Information discussed with and copy given to patient by: Lennart Pall, 06/11/2017, 2:20 PM

## 2017-06-11 NOTE — Progress Notes (Signed)
Physical Therapy Session Note  Patient Details  Name: Robert Sandoval MRN: [id number] Date of Birth: [birth year]  Today's Date: 06/11/2017 PT Individual Time: 0950-1020 PT Individual Time Calculation (min): 30 min   Short Term Goals: Week 1:  PT Short Term Goal 1 (Week 1): STG=LTG due to ELOS  Skilled Therapeutic Interventions/Progress Updates:    Session focused on NMR to address balance and LLE coordination and motor control during functional mobility including transfers, gait without AD with overall min assist and verbal cues for L heel strike and decreased stance time on LLE noted especially as fatigued, Kinetron in standing without UE support or minimal intermittent UE support with rest break between trials and increased step height to increase workload with focus on controlled movement, and Biodex (limits of stability on level 8 compliant surface, no UE support, 19% with 2:13 with min facilitation for weightshifting and verbal cues)  Therapy Documentation Precautions:  Precautions Precautions: Fall Precaution Comments: BP< 180/105 Restrictions Weight Bearing Restrictions: No   Pain: Pain Assessment Pain Score: 0-No pain   See Function Navigator for Current Functional Status.   Therapy/Group: Individual Therapy  Canary Brim Ivory Broad, PT, DPT  06/11/2017, 10:28 AM

## 2017-06-12 ENCOUNTER — Inpatient Hospital Stay (HOSPITAL_COMMUNITY): Payer: Managed Care, Other (non HMO) | Admitting: Occupational Therapy

## 2017-06-12 ENCOUNTER — Inpatient Hospital Stay (HOSPITAL_COMMUNITY): Payer: Managed Care, Other (non HMO) | Admitting: Physical Therapy

## 2017-06-12 ENCOUNTER — Inpatient Hospital Stay (HOSPITAL_COMMUNITY): Payer: Managed Care, Other (non HMO)

## 2017-06-12 DIAGNOSIS — R0989 Other specified symptoms and signs involving the circulatory and respiratory systems: Secondary | ICD-10-CM

## 2017-06-12 DIAGNOSIS — I952 Hypotension due to drugs: Secondary | ICD-10-CM

## 2017-06-12 LAB — BASIC METABOLIC PANEL
ANION GAP: 6 (ref 5–15)
BUN: 12 mg/dL (ref 6–20)
CALCIUM: 8.9 mg/dL (ref 8.9–10.3)
CO2: 25 mmol/L (ref 22–32)
Chloride: 110 mmol/L (ref 101–111)
Creatinine, Ser: 0.82 mg/dL (ref 0.61–1.24)
GFR calc Af Amer: >60 mL/min (ref >60)
Glucose, Bld: 101 mg/dL — ABNORMAL HIGH (ref 65–99)
POTASSIUM: 4.1 mmol/L (ref 3.5–5.1)
Sodium: 141 mmol/L (ref 135–145)

## 2017-06-12 LAB — CBC
HEMATOCRIT: 40 % (ref 39.0–52.0)
Hemoglobin: 13.1 g/dL (ref 13.0–17.0)
MCH: 28.2 pg (ref 26.0–34.0)
MCHC: 32.8 g/dL (ref 30.0–36.0)
MCV: 86 fL (ref 78.0–100.0)
Platelets: 245 10*3/uL (ref 150–400)
RBC: 4.65 MIL/uL (ref 4.22–5.81)
RDW: 12.9 % (ref 11.5–15.5)
WBC: 4.9 10*3/uL (ref 4.0–10.5)

## 2017-06-12 LAB — GLUCOSE, CAPILLARY: GLUCOSE-CAPILLARY: 96 mg/dL (ref 65–99)

## 2017-06-12 MED ORDER — LISINOPRIL 10 MG PO TABS
10.0000 mg | ORAL_TABLET | Freq: Every day | ORAL | Status: DC
  Administered 2017-06-13: 10 mg via ORAL
  Filled 2017-06-12: qty 1

## 2017-06-12 NOTE — Progress Notes (Signed)
Physical Therapy Session Note  Patient Details  Name: Robert Sandoval MRN: [id number] Date of Birth: 07/11/1960  Today's Date: 06/12/2017 PT Individual Time: 1003-1100 PT Individual Time Calculation (min): 57 min   Short Term Goals: Week 1:  PT Short Term Goal 1 (Week 1): STG=LTG due to ELOS  Skilled Therapeutic Interventions/Progress Updates: Pt presented sitting at EOB agreeable to therapy. Pt requesting to use toilet, ambulated to/from bathroom no AD supervision. Pt ambulated to ortho gym and participated in functional activities (see functional tab). Pt was able to perform all activities at supervision or Mod I level. Pt participated in Berg Balance Test with improved score from 40/56 to 50/56 noting improved safety awareness and safety without AD. Pt provided and participated in [place] B activities requiring min cues for technique and demonstrating good safety throughout. Pt ambulated back to room mod I and remained seated at EOB with needs met.      Therapy Documentation Precautions:  Precautions Precautions: Fall Precaution Comments: BP< 180/105 Restrictions Weight Bearing Restrictions: No General:   Vital Signs: Therapy Vitals Temp: 98 F (36.7 C) Temp Source: Oral Pulse Rate: 98 Resp: 20 BP: (!) 120/100 Patient Position (if appropriate): Sitting Oxygen Therapy SpO2: 95 % O2 Device: Room Air Pain: Pain Assessment Pain Scale: 0-10 Pain Score: 0-No pain Mobility: Bed Mobility Bed Mobility: Rolling Right;Rolling Left;Sit to Supine;Supine to Sit Rolling Right: 6: Modified independent (Device/Increase time) Rolling Left: 6: Modified independent (Device/Increase time) Supine to Sit: 6: Modified independent (Device/Increase time) Sit to Supine: 6: Modified independent (Device/Increase time) Transfers Sit to Stand: 6: Modified independent (Device/Increase time) Stand to Sit: 6: Modified independent (Device/Increase time)  See Function Navigator for Current Functional  Status.   Therapy/Group: Individual Therapy  Rosita DeChalus  Rosita DeChalus, PTA  06/12/2017, 3:35 PM  

## 2017-06-12 NOTE — Progress Notes (Signed)
Occupational Therapy Session Note  Patient Details  Name: Robert Sandoval MRN: [id number] Date of Birth: 07/14/1960  Today's Date: 06/12/2017 OT Individual Time: 6546-5035 OT Individual Time Calculation (min): 45 min    Short Term Goals: Week 1:  OT Short Term Goal 1 (Week 1): STG = LTGs due to ELOS  Skilled Therapeutic Interventions/Progress Updates:    Treatment session with focus on functional mobility in home environment and LUE NMR.  Pt received seated EOB with RN providing AM meds.  Pt reports bathing yesterday afternoon with wife present, requesting to shower during PM session.  Ambulated to ADL apt without AD with supervision.  Engaged in functional mobility in home environment and simulated meal prep activity at overall Mod I level.  Pt demonstrating good use of balance reactions and energy conservation strategies during meal prep activity.  Completed tub/shower transfer by stepping over tub ledge without assistance.  Recommend pt obtain grab bar for shower to increase safety with transfer and bathing.  Engaged in [place] with small pegs with in-hand manipulation and translation, pt with intermittent dropping of items with Lt hand due to reports of tingling.  Pt completed 9 hole peg test Rt: 22 seconds and Lt: 32 seconds, demonstrating improvements in coordination and motor control.  Pt continues to demonstrate mild decrease in grip strength and coordination, pt reports noticing mostly when playing guitar.  Ambulated back to room without AD with supervision and left seated upright with all needs in reach.  Therapy Documentation Precautions:  Precautions Precautions: Fall Precaution Comments: BP< 180/105 Restrictions Weight Bearing Restrictions: No General:   Vital Signs: Therapy Vitals Temp: 98.1 F (36.7 C) Temp Source: Oral Pulse Rate: 88 Resp: 20 BP: 134/89 Patient Position (if appropriate): Sitting Oxygen Therapy SpO2: 98 % O2 Device: Room Air Pain: Pain  Assessment Pain Score: 2   Premedicated  See Function Navigator for Current Functional Status.   Therapy/Group: Individual Therapy  [name] 06/12/2017, 9:53 AM

## 2017-06-12 NOTE — Progress Notes (Signed)
Occupational Therapy Session Note  Patient Details  Name: Robert Sandoval MRN: [id number] Date of Birth: [birth year]  Today's Date: 06/12/2017 OT Individual Time: 1335-1426 OT Individual Time Calculation (min): 51 min   Short Term Goals: Week 1:  OT Short Term Goal 1 (Week 1): STG = LTGs due to ELOS  Skilled Therapeutic Interventions/Progress Updates:    Pt received in therapy gym. Session focused on d/c planning, activity tolerance/endurance, and safety awareness. (S) provided during 75 ft of functional mobility to room. Pt completed standing level shower at mod I level. Pt able to bend down and retrieve item from ground during shower with (S). Vc/edu provided for safety and reducing fall risk during b/d tasks, including donning socks/shoes from seated level. Pt completed 200 ft of functional mobility with (S) and vc for pacing/lifting LLE. Pt completed 15 min on NuStep to increase functional endurance and reciprocal movement needed for stepping over curbs/in and out of tub. Vc provided for increasing work load to challenge cardiovascular demands. Pt returned to room and was left supine in bed with all needs met.   Therapy Documentation Precautions:  Precautions Precautions: Fall Precaution Comments: BP< 180/105 Restrictions Weight Bearing Restrictions: No Pain: Pain Assessment Pain Scale: 0-10 Pain Score: 0-No pain  See Function Navigator for Current Functional Status.   Therapy/Group: Individual Therapy  Curtis Sites 06/12/2017, 2:28 PM

## 2017-06-12 NOTE — Progress Notes (Signed)
Occupational Therapy Discharge Summary  Patient Details  Name: Robert Sandoval MRN: [id number] Date of Birth: [birth year]  Patient has met 9 of 9 long term goals due to improved activity tolerance, improved balance, postural control, ability to compensate for deficits, functional use of  LEFT upper extremity, improved awareness and improved coordination.  Patient to discharge at overall Modified Independent level.  Patient's care partner is independent to provide the necessary physical assistance at discharge.    Reasons goals not met: All goals met  Recommendation:  Patient will benefit from ongoing skilled OT services in outpatient setting to continue to advance functional skills in the area of BADL, iADL and Reduce care partner burden.  Equipment: No equipment provided  Reasons for discharge: treatment goals met and discharge from hospital  Patient/family agrees with progress made and goals achieved: Yes  OT Discharge Precautions/Restrictions  Precautions Precautions: Fall Restrictions Weight Bearing Restrictions: No Pain Pain Assessment Pain Scale: 0-10 Pain Score: 0-No pain ADL ADL ADL Comments: See functional navigator Vision Baseline Vision/History: Wears glasses Wears Glasses: At all times Patient Visual Report: No change from baseline Vision Assessment?: No apparent visual deficits Perception  Perception: Within Functional Limits Praxis Praxis: Intact Cognition Overall Cognitive Status: Within Functional Limits for tasks assessed Arousal/Alertness: Awake/alert Orientation Level: Oriented X4 Attention: Alternating Selective Attention: Appears intact Memory: Appears intact Awareness: Appears intact Problem Solving: Appears intact Safety/Judgment: Appears intact Sensation Sensation Light Touch: Appears Intact Proprioception: Appears Intact Additional Comments: pt reports "different" with numbness/tingling in Lt arm but light touch intact Coordination Fine  Motor Movements are Fluid and Coordinated: No(improved from eval) Finger Nose Finger Test: mild ataxia LUE 9 Hole Peg Test: Rt: 22 seconds and Lt: 32 seconds (eval: 31 seconds on Rt and 43 seconds on Lt Mobility  Bed Mobility Bed Mobility: Rolling Right;Rolling Left;Sit to Supine;Supine to Sit Rolling Right: 6: Modified independent (Device/Increase time) Rolling Left: 6: Modified independent (Device/Increase time) Supine to Sit: 6: Modified independent (Device/Increase time) Sit to Supine: 6: Modified independent (Device/Increase time) Transfers Transfers: Sit to Stand;Stand to Sit Sit to Stand: 6: Modified independent (Device/Increase time) Stand to Sit: 6: Modified independent (Device/Increase time)   Extremity/Trunk Assessment RUE Assessment RUE Assessment: Within Functional Limits LUE Assessment LUE Assessment: Within Functional Limits(ROM WFL, strength grossly 4/5, grasp and grip strength mildly less than dominant Rt)   See Function Navigator for Current Functional Status.  [name] 06/12/2017, 5:21 PM

## 2017-06-12 NOTE — Progress Notes (Signed)
[facility] PHYSICAL MEDICINE & REHABILITATION     PROGRESS NOTE  Subjective/Complaints:  Patient seen lying in bed this morning.  He states he slept well overnight.  He would like to get dressed for the day.  ROS: Denies CP, SOB, nausea, vomiting, diarrhea.  Objective: Vital Signs: Blood pressure 134/89, pulse 88, temperature 98.1 F (36.7 C), temperature source Oral, resp. rate 20, SpO2 98 %. No results found. Recent Labs    06/12/17 0520  WBC 4.9  HGB 13.1  HCT 40.0  PLT 245   Recent Labs    06/12/17 0520  NA 141  K 4.1  CL 110  GLUCOSE 101*  BUN 12  CREATININE 0.82  CALCIUM 8.9   CBG (last 3)  Recent Labs    06/11/17 0649  GLUCAP 96    Wt Readings from Last 3 Encounters:  06/06/17 72.2 kg (159 lb 2.8 oz)  08/05/16 72.6 kg (160 lb)  07/13/15 75.3 kg (166 lb)    Physical Exam:  BP 134/89 (BP Location: Left Arm)   Pulse 88   Temp 98.1 F (36.7 C) (Oral)   Resp 20   SpO2 98%   General: No acute distress. Well-developed. Well-nourished. HENT: Normocephalic. Atraumatic. Eyes: EOMI. No discharge. Heart: RRR. No JVD. Lungs: Clear to auscultation, breathing unlabored Abdomen: Positive bowel sounds, nondistended Musculoskeletal: No edema or tenderness in extremities Neurologic: alert and oriented Motor: 5/5 in R deltoid, bicep, tricep, grip, hip flexor, knee extensors, ankle dorsiflexor and plantar flexor 4+/5 left deltoid bicep tricep grip (unchanged) 4/5 left hip flexor, knee extensor, ankle dorsiflexor (unchanged) Skin: No evidence of breakdown, no evidence of rash Psych: Mood and affect are appropriate  Assessment/Plan: 1. 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 Physiatrist is providing close team supervision and 24 hour management of active medical problems listed below. Physiatrist and rehab team  continue to assess barriers to discharge/monitor patient progress toward functional and medical goals.  Function:  Bathing Bathing position   Position: Shower  Bathing parts Body parts bathed by patient: Right arm, Left arm, Chest, Front perineal area, Abdomen, Buttocks, Right upper leg, Left upper leg, Right lower leg, Left lower leg Body parts bathed by helper: Back  Bathing assist Assist Level: Touching or steadying assistance(Pt > 75%)      Upper Body Dressing/Undressing Upper body dressing   What is the patient wearing?: Pull over shirt/dress     Pull over shirt/dress - Perfomed by patient: Thread/unthread right sleeve, Thread/unthread left sleeve, Put head through opening, Pull shirt over trunk          Upper body assist Assist Level: Touching or steadying assistance(Pt > 75%)      Lower Body Dressing/Undressing Lower body dressing   What is the patient wearing?: Socks, Shoes Underwear - Performed by patient: Thread/unthread right underwear leg, Thread/unthread left underwear leg, Pull underwear up/down   Pants- Performed by patient: Thread/unthread right pants leg, Thread/unthread left pants leg, Pull pants up/down       Socks - Performed by patient: Don/doff right sock, Don/doff left sock   Shoes - Performed by patient: Don/doff right shoe, Don/doff left shoe, Fasten right, Fasten left            Lower body assist Assist for lower body dressing: Supervision or verbal cues      Toileting Toileting   Toileting steps completed by patient: Adjust clothing  prior to toileting, Performs perineal hygiene, Adjust clothing after toileting Toileting steps completed by helper: Adjust clothing prior to toileting, Adjust clothing after toileting Toileting Assistive Devices: Grab bar or rail  Toileting assist Assist level: More than reasonable time   Transfers Chair/bed transfer   Chair/bed transfer method: Ambulatory Chair/bed transfer assist level: Touching or  steadying assistance (Pt > 75%) Chair/bed transfer assistive device: Medical sales representative     Max distance: 200 Assist level: Touching or steadying assistance (Pt > 75%)   Wheelchair          Cognition Comprehension Comprehension assist level: Follows complex conversation/direction with no assist  Expression Expression assist level: Expresses complex ideas: With no assist  Social Interaction Social Interaction assist level: Interacts appropriately with others - No medications needed.  Problem Solving Problem solving assist level: Solves complex problems: Recognizes & self-corrects  Memory Memory assist level: Complete Independence: No helper    Medical Problem List and Plan: 1.   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 DVT Prophylaxis/Anticoagulation: Pharmaceutical:Lovenox 3.LBP/Pain Management:Resumed gabapentin to help with LLE radiculopathy. 4. Mood:LCSW to follow for evaluation and support. 5. Neuropsych: This patientiscapable of making decisions on hisown behalf. 6.Skin/Wound Care:routine pressure relief measures. 7. Fluids/Electrolytes/Nutrition:  BMP within acceptable range on 5/28 8. HTN: Monitor BP bid. Continue   Lisinopril bid, decreased to 10 mg twice a day on 5/25, decrease to daily on 5/28.   Vitals:   06/12/17 0559 06/12/17 0847  BP: 96/65 134/89  Pulse: 61 88  Resp: 20   Temp: 98.1 F (36.7 C)   SpO2: 98%    Labile on 5/28 9. Dyslipidemia: On Lipitor now. 10. Constipation: scheduled miralax to bid.  11. BPH: On flomax.  Voiding without difficulty  LOS (Days) 5 A FACE TO FACE EVALUATION WAS PERFORMED  Haelie Clapp Lorie Phenix 06/12/2017 9:21 AM

## 2017-06-12 NOTE — Progress Notes (Signed)
Physical Therapy Session Note  Patient Details  Name: Robert Sandoval MRN: [id number] Date of Birth: [birth year]  Today's Date: 06/12/2017 PT Individual Time: 1300-1330 PT Individual Time Calculation (min): 30 min   Short Term Goals: Week 1:  PT Short Term Goal 1 (Week 1): STG=LTG due to ELOS  Skilled Therapeutic Interventions/Progress Updates:    Functional gait training without AD on unit with focus on increasing stance time on L and L heel strike with supervision overall. Fall recovery practice performing transfers supervision to modified independent on second repetition, reviewing what to do in case of fall. In prone performed  5 reps of push ups for functional strengthening. Single limb and single UE reaches in quadruped position x 5 reps each and then 10 reps each side of opposite/arm leg for NMR for balance retraining, coordination, and motor control. In standing, performed lateral stepping with squat x 5 reps to R and then 5 reps to L with close supervision/steadying assist during squat and cues for technique and form. Denies concerns in regards to d/c tomorrow.  Therapy Documentation Precautions:  Precautions Precautions: Fall Precaution Comments: BP< 180/105 Restrictions Weight Bearing Restrictions: No   Pain:  Denies pain    See Function Navigator for Current Functional Status.   Therapy/Group: Individual Therapy  Canary Brim Ivory Broad, PT, DPT  06/12/2017, 1:48 PM

## 2017-06-12 NOTE — Progress Notes (Signed)
Physical Therapy Discharge Summary  Patient Details  Name: Robert Sandoval MRN: [id number] Date of Birth: 08-Oct-1960  Today's Date: 06/12/2017  Patient has met 7 of 7 long term goals due to improved activity tolerance, improved balance, improved postural control, increased strength, improved awareness and improved coordination.  Patient to discharge at an ambulatory level Modified Independent.   Patient's care partner is independent to provide the necessary physical assistance at discharge.  Reasons goals not met: N/A  Recommendation:  Patient will benefit from ongoing skilled PT services in outpatient setting to continue to advance safe functional mobility, address ongoing impairments in higher level balance, strength, coordination, sensation, and minimize fall risk.  Equipment: No equipment provided  Reasons for discharge: treatment goals met  Patient/family agrees with progress made and goals achieved: Yes  PT Discharge Precautions/Restrictions Restrictions Weight Bearing Restrictions: No Cognition Overall Cognitive Status: Within Functional Limits for tasks assessed Arousal/Alertness: Awake/alert Orientation Level: Oriented X4 Attention: Alternating Selective Attention: Appears intact Memory: Appears intact Awareness: Appears intact Problem Solving: Appears intact Safety/Judgment: Appears intact Sensation Sensation Light Touch: Appears Intact Proprioception: Appears Intact Additional Comments: pt reports "different" with numbness/tingling in Lt arm but light touch intact Motor  Motor Motor: Hemiplegia Motor - Discharge Observations: R hemiparesis  Mobility Transfers Transfers: Yes Sit to Stand: 6: Modified independent (Device/Increase time) Locomotion  Ambulation Ambulation: Yes Ambulation/Gait Assistance: 6: Modified independent (Device/Increase time) Ambulation Distance (Feet): 250 Feet Assistive device: None Stairs / Additional Locomotion Stairs: Yes Stairs  Assistance: 5: Supervision Stair Management Technique: One rail Left Number of Stairs: 12  Trunk/Postural Assessment  Cervical Assessment Cervical Assessment: Within Functional Limits Thoracic Assessment Thoracic Assessment: Within Functional Limits Lumbar Assessment Lumbar Assessment: Within Functional Limits Postural Control Postural Control: Within Functional Limits  Balance Berg Balance Test Sit to Stand: Able to stand without using hands and stabilize independently Standing Unsupported: Able to stand safely 2 minutes Sitting with Back Unsupported but Feet Supported on Floor or Stool: Able to sit safely and securely 2 minutes Stand to Sit: Sits safely with minimal use of hands Transfers: Able to transfer safely, minor use of hands Standing Unsupported with Eyes Closed: Able to stand 10 seconds safely Standing Ubsupported with Feet Together: Able to place feet together independently and stand 1 minute safely From Standing, Reach Forward with Outstretched Arm: Can reach forward >12 cm safely (5") From Standing Position, Pick up Object from Floor: Able to pick up shoe safely and easily From Standing Position, Turn to Look Behind Over each Shoulder: Looks behind from both sides and weight shifts well Turn 360 Degrees: Able to turn 360 degrees safely one side only in 4 seconds or less Standing Unsupported, Alternately Place Feet on Step/Stool: Able to stand independently and complete 8 steps >20 seconds Standing Unsupported, One Foot in Front: Able to take small step independently and hold 30 seconds Standing on One Leg: Able to lift leg independently and hold 5-10 seconds Total Score: 50   See Function Navigator for Current Functional Status.  Robert Sandoval 06/12/2017, 10:45 AM   Lars Masson, PT, DPT

## 2017-06-12 NOTE — Discharge Summary (Signed)
Physician Discharge Summary  Patient ID: Robert Sandoval MRN: [id number] DOB/AGE: 1960/12/04 57 y.o.  Admit date: 06/07/2017 Discharge date: 06/13/2017  Discharge Diagnoses:  Principal Problem:   Acute ischemic stroke ([place]) - R basal ganglia s/p IV tPA Active Problems:   Essential hypertension, benign   Hyperlipidemia   Benign prostatic hyperplasia   Hemiparesis affecting left side as late effect of stroke [facility])   Discharged Condition: stable   Significant Diagnostic Studies: N/A   Labs:  Basic Metabolic Panel: Recent Labs  Lab 06/07/17 0628 06/08/17 0609 06/12/17 0520  NA 139 141 141  K 4.1 3.7 4.1  CL 108 108 110  CO2 22 25 25   GLUCOSE 107* 101* 101*  BUN 16 15 12   CREATININE 0.87 0.86 0.82  CALCIUM 9.3 9.2 8.9    CBC: Recent Labs  Lab 06/07/17 0628 06/08/17 0609 06/12/17 0520  WBC 6.7 5.8 4.9  NEUTROABS  --  3.2  --   HGB 14.6 14.1 13.1  HCT 44.4 42.2 40.0  MCV 86.0 85.4 86.0  PLT 282 278 245    CBG: Recent Labs  Lab 06/11/17 0649  GLUCAP 96    Brief HPI:   Robert Sandoval is a 57 year old right-handed male of [place] descent with history of hypertension hyperlipidemia low back pain with left leg extremity radiculopathy, who was admitted on 06-05-17 with left-sided weakness and elevated systolic blood pressure 626R.  CT of head was negative and he received TPA.  CTA head neck done was negative for large vessel occlusion.  MRI of brain done revealed revealing acute right basal ganglia/corona radiata infarct and acute tiny right occipital infarct in watershed territory.  2D echo showed EF of 60 to 65% with mild to moderate AVR.  Dr. Erlinda Hong felt that stroke was due to small vessel vessel disease.  He recommended DAPT x3 weeks followed by Plavix alone as well as aggressive risk factor management.  Patient currently with functional deficit due to left-sided weakness with sensory deficits and CIR was recommended for follow-up therapy   Hospital Course: Robert Sandoval was  admitted to rehab 06/07/2017 for inpatient therapies to consist of PT and OT at least three hours five days a week. Past admission physiatrist, therapy team and rehab RN have worked together to provide customized collaborative inpatient rehab. He has been maintained on ASA/plavix during his stay. Blood pressures were monitored on bid basis and lisinopril was decreased on 5/28 due to hypotension. Follow up BMET is WNL and CBC showed mild drop in H/H. No signs of bleeding noted. He is continent of bowel and bladder. Po intake has been good and he has made steady progress to modified independence with use of RW.   He will continue to receive further follow-up outpatient PT OT at [facility] neuro outpatient rehab after discharge.   Rehab course: During patient's stay in rehab brief team conference ws  held to monitor patient's progress, set goals and discuss barriers to discharge. At admission, patient required min assist with mobility and basic self care tasks.  He  has had improvement in activity tolerance, balance, postural control as well as ability to compensate for deficits. He has had improvement in functional use LUE  and LLE as well as improvement in awareness. He is able to complete ADL tasks at modified independent level. He is modified independent for transfers and to ambulate 250' without AD. Berg balance score has improved from 40- 50/56. He requires supervision to climb 12 stairs. Family education completed.    Disposition:  Home  Diet: Heart Healthy.  Special Instructions: 1. No driving or strenuous activity till cleared by MD. 2. Needs CBC rechecked in 1-2 weeks. 3. Stop taking ASA after June 12th. .    Discharge Instructions    Ambulatory referral to Physical Medicine Rehab   Complete by:  As directed    1-2 weeks transitional care appt     Allergies as of 06/13/2017   No Known Allergies     Medication List    STOP taking these medications   senna-docusate 8.6-50 MG  tablet Commonly known as:  Senokot-S     TAKE these medications   acetaminophen 325 MG tablet Commonly known as:  TYLENOL Take 1-2 tablets (325-650 mg total) by mouth every 4 (four) hours as needed for mild pain.   aspirin 81 MG EC tablet Take 1 tablet (81 mg total) by mouth daily.   atorvastatin 20 MG tablet Commonly known as:  LIPITOR Take 1 tablet (20 mg total) by mouth daily at 6 PM.   clopidogrel 75 MG tablet Commonly known as:  PLAVIX Take 1 tablet (75 mg total) by mouth daily.   fluticasone 50 MCG/ACT nasal spray Commonly known as:  FLONASE Place 1 spray into both nostrils daily.   gabapentin 300 MG capsule Commonly known as:  NEURONTIN Take 1 capsule (300 mg total) by mouth 3 (three) times daily.   lisinopril 10 MG tablet Commonly known as:  PRINIVIL,ZESTRIL Take 1 tablet (10 mg total) by mouth daily. Start taking on:  06/14/2017 What changed:    medication strength  how much to take  when to take this   loratadine 10 MG tablet Commonly known as:  CLARITIN Take 1 tablet (10 mg total) by mouth daily.   polyethylene glycol packet Commonly known as:  MIRALAX / GLYCOLAX Take 17 g by mouth 2 (two) times daily. What changed:    when to take this  reasons to take this   tamsulosin 0.4 MG Caps capsule Commonly known as:  FLOMAX Take 0.4 mg by mouth daily.      Follow-up Information    Jamse Arn, MD Follow up.   Specialty:  Physical Medicine and Rehabilitation Why:  office will call you with follow up appointment Contact information: [address] [name] [address] [phone]        Rosalin Hawking, MD. Call in 1 day(s).   Specialty:  Neurology Why:  for follow up appointment Contact information: [address] [place] [address] [phone]        [name], MD Follow up on 06/19/2017.   Specialties:  Family Medicine, Sports Medicine Why:  @ 11:00 am (please arrive at 10:45 am for hospital  follow up appt) Contact information: [place] [address] [phone]           Signed: Bary Leriche 06/13/2017, 5:21 PM

## 2017-06-13 MED ORDER — FLUTICASONE PROPIONATE 50 MCG/ACT NA SUSP: 1.0000 | Freq: Every day | NASAL | 2 refills | Status: DC

## 2017-06-13 MED ORDER — LISINOPRIL 10 MG PO TABS: 10.0000 mg | ORAL_TABLET | Freq: Every day | ORAL | 0 refills | Status: DC

## 2017-06-13 MED ORDER — POLYETHYLENE GLYCOL 3350 17 G PO PACK: 17.0000 g | PACK | Freq: Two times a day (BID) | ORAL | 0 refills | Status: DC

## 2017-06-13 MED ORDER — ATORVASTATIN CALCIUM 20 MG PO TABS: 20.0000 mg | ORAL_TABLET | Freq: Every day | ORAL | 0 refills | Status: DC

## 2017-06-13 MED ORDER — ACETAMINOPHEN 325 MG PO TABS: 325.0000 mg | ORAL_TABLET | ORAL | Status: DC | PRN

## 2017-06-13 MED ORDER — LORATADINE 10 MG PO TABS: 10.0000 mg | ORAL_TABLET | Freq: Every day | ORAL | Status: DC

## 2017-06-13 NOTE — Discharge Instructions (Signed)
Inpatient Rehab Discharge Instructions  Robert Sandoval Discharge date and time:  06/13/17  Activities/Precautions/ Functional Status: Activity: no lifting, driving, or strenuous exercise till cleared by MD. Diet: cardiac diet Wound Care:   Functional status:  ___ No restrictions     ___ Walk up steps independently ___ 24/7 supervision/assistance   ___ Walk up steps with assistance ___ Intermittent supervision/assistance  ___ Bathe/dress independently ___ Walk with walker     ___ Bathe/dress with assistance ___ Walk Independently    ___ Shower independently ___ Walk with assistance    ___ Shower with assistance _X__ No alcohol     ___ Return to work/school ________    COMMUNITY REFERRALS UPON DISCHARGE:    Outpatient: PT     OT                  Agency:  [facility]    Phone: [phone]               Appointment Date/Time:  6/6   @ 8:30 (arrive 8:00)                                                                 6/10 @ 8:45 am  Medical Equipment/Items Ordered:  Rolling walker                                                      Agency/Supplier:  [facility] @ [phone]   Special Instructions: 1. STOP TAKING ASPIRIN AFTER June 14TH.   STROKE/TIA DISCHARGE INSTRUCTIONS SMOKING Cigarette smoking nearly doubles your risk of having a stroke & is the single most alterable risk factor  If you smoke or have smoked in the last 12 months, you are advised to quit smoking for your health.  Most of the excess cardiovascular risk related to smoking disappears within a year of stopping.  Ask you doctor about anti-smoking medications  [place] Quit Line: 1-800-QUIT NOW  Free Smoking Cessation Classes [phone]  CHOLESTEROL Know your levels; limit fat & cholesterol in your diet  Lipid Panel     Component Value Date/Time   CHOL 157 06/05/2017 0401   CHOL 172 08/05/2016 1531   TRIG 195 (H) 06/05/2017 0401   HDL 31 (L) 06/05/2017 0401   HDL 30 (L) 08/05/2016 1531   CHOLHDL 5.1 06/05/2017 0401   VLDL 39 06/05/2017 0401   LDLCALC 87 06/05/2017 0401   LDLCALC 76 08/05/2016 1531      Many patients benefit from treatment even if their cholesterol is at goal.  Goal: Total Cholesterol (CHOL) less than 160  Goal:  Triglycerides (TRIG) less than 150  Goal:  HDL greater than 40  Goal:  LDL (LDLCALC) less than 100   BLOOD PRESSURE American Stroke Association blood pressure target is less that 120/80 mm/Hg  Your discharge blood pressure is:  BP: 106/73  Monitor your blood pressure  Limit your salt and alcohol intake  Many individuals will require more than one medication for high blood pressure  DIABETES (A1c is a blood sugar average for last 3 months) Goal HGBA1c is under 7% (  HBGA1c is blood sugar average for last 3 months)  Diabetes: No known diagnosis of diabetes    Lab Results  Component Value Date   HGBA1C 5.6 06/05/2017     Your HGBA1c can be lowered with medications, healthy diet, and exercise.  Check your blood sugar as directed by your physician  Call your physician if you experience unexplained or low blood sugars.  PHYSICAL ACTIVITY/REHABILITATION Goal is 30 minutes at least 4 days per week  Activity: No driving, Therapies: see above Return to work: to be decided on follow up  Activity decreases your risk of heart attack and stroke and makes your heart stronger.  It helps control your weight and blood pressure; helps you relax and can improve your mood.  Participate in a regular exercise program.  Talk with your doctor about the best form of exercise for you (dancing, walking, swimming, cycling).  DIET/WEIGHT Goal is to maintain a healthy weight  Your discharge diet is:  Diet Order           Diet Heart Room service appropriate? Yes; Fluid consistency: Thin  Diet effective now         liquids Your height is:   Your current weight is:  Your Body Mass Index (BMI) is:    Following the type of diet specifically designed for  you will help prevent another stroke.  Your goal weight is:    Your goal Body Mass Index (BMI) is 19-24.  Healthy food habits can help reduce 3 risk factors for stroke:  High cholesterol, hypertension, and excess weight.  RESOURCES Stroke/Support Group:  Call [phone]   STROKE EDUCATION PROVIDED/REVIEWED AND GIVEN TO PATIENT Stroke warning signs and symptoms How to activate emergency medical system (call 911). Medications prescribed at discharge. Need for follow-up after discharge. Personal risk factors for stroke. Pneumonia vaccine given:  Flu vaccine given:  My questions have been answered, the writing is legible, and I understand these instructions.  I will adhere to these goals & educational materials that have been provided to me after my discharge from the hospital.     My questions have been answered and I understand these instructions. I will adhere to these goals and the provided educational materials after my discharge from the hospital.  Patient/Caregiver Signature _______________________________ Date __________  Clinician Signature _______________________________________ Date __________  Please bring this form and your medication list with you to all your follow-up doctor's appointments.

## 2017-06-13 NOTE — Plan of Care (Signed)
Pt d/c to home

## 2017-06-13 NOTE — Patient Care Conference (Signed)
Inpatient RehabilitationTeam Conference and Plan of Care Update Date: 06/13/2017   Time: 11:45 AM    Patient Name: Robert Sandoval      Medical Record Number: [id number]  Date of Birth: 1960-04-24 Sex: Male         Room/Bed: 4M11C/4M11C-01 Payor Info: Payor: [employer] / Plan: [employer] MANAGED / Product Type: *No Product type* /    Admitting Diagnosis: R CVA  Admit Date/Time:  06/07/2017  4:48 PM Admission Comments: No comment available   Primary Diagnosis:  Acute ischemic stroke [facility]) Principal Problem: Acute ischemic stroke [facility])  Patient Active Problem List   Diagnosis Date Noted  . Hypotension due to drugs   . Labile blood pressure   . Benign prostatic hyperplasia   . Hemiparesis affecting left side as late effect of stroke ([place])   . Basal ganglia infarction ([place]) 06/07/2017  . 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 ([place])   . Gait disturbance, post-stroke   . Hyperlipidemia   . Acute ischemic stroke ([place]) - R basal ganglia s/p IV tPA 06/05/2017  . Prostate nodule 10/30/2016  . Essential hypertension 08/05/2016  . External hemorrhoids without complication 62/03/5595  . Melanosis of colon 11/23/2015  . Diverticulosis of colon without hemorrhage 11/23/2015  . Internal hemorrhoids 11/23/2015  . Essential hypertension, benign 09/14/2014    Expected Discharge Date: Expected Discharge Date: 06/13/17  Team Members Present: Physician leading conference: Dr. Delice Lesch Social Worker Present: Lennart Pall, LCSW Nurse Present: Isla Pence, RN PT Present: Phylliss Bob, PTA;Barrie Folk, PT OT Present: [name], OT PPS Coordinator present : Daiva Nakayama, RN, CRRN     Current Status/Progress Goal Weekly Team Focus  Medical   Left hemiparesis  secondary to right basal ganglia/corona radiata and occipital infarcts  Improve mobility  See above   Bowel/Bladder   continent b/b; LBM 5/27, on Flomax  remain continent b/b  assess needs q shift and PRN    Swallow/Nutrition/ Hydration             ADL's   Mod I overall  Mod I  d/c planning   Mobility   mod I  Mod I  gait, transfers, d/c planning   Communication             Safety/Cognition/ Behavioral Observations            Pain   denies pain; Gabapentin  pain <3  assess pain q shift and PRN   Skin   no skin issues  remain breakdown/infection free  assess q shift and PRN    Rehab Goals Patient on target to meet rehab goals: Yes *See Care Plan and progress notes for long and short-term goals.     Barriers to Discharge  Current Status/Progress Possible Resolutions Date Resolved   Physician    Medical stability     See above  Therapies, optimize HTN meds, D/c today      Nursing                  PT                    OT                  SLP                SW                Discharge Planning/Teaching Needs:  home with family to  assist if needed  NA - mod ind goals.   Team Discussion:  Pt has met all mod ind goals and ready for d/c today with OPPT and  OT.  Revisions to Treatment Plan:  None    Continued Need for Acute Rehabilitation Level of Care: The patient requires daily medical management by a physician with specialized training in physical medicine and rehabilitation for the following conditions: Daily direction of a multidisciplinary physical rehabilitation program to ensure safe treatment while eliciting the highest outcome that is of practical value to the patient.: Yes Daily medical management of patient stability for increased activity during participation in an intensive rehabilitation regime.: Yes Daily analysis of laboratory values and/or radiology reports with any subsequent need for medication adjustment of medical intervention for : Neurological problems  Shaughn Thomley 06/13/2017, 3:48 PM

## 2017-06-13 NOTE — Progress Notes (Signed)
[place] PHYSICAL MEDICINE & REHABILITATION     PROGRESS NOTE  Subjective/Complaints:  Patient seen lying in bed this morning. He slept well overnight. He states he is re discharge. He has questions regarding restrictions at discharge and medications.  ROS: denies CP, SOB, nausea, vomiting, diarrhea.  Objective: Vital Signs: Blood pressure 109/69, pulse (!) 59, temperature 98.4 F (36.9 C), temperature source Oral, resp. rate 16, SpO2 99 %. No results found. Recent Labs    06/12/17 0520  WBC 4.9  HGB 13.1  HCT 40.0  PLT 245   Recent Labs    06/12/17 0520  NA 141  K 4.1  CL 110  GLUCOSE 101*  BUN 12  CREATININE 0.82  CALCIUM 8.9   CBG (last 3)  Recent Labs    06/11/17 0649  GLUCAP 96    Wt Readings from Last 3 Encounters:  06/06/17 72.2 kg (159 lb 2.8 oz)  08/05/16 72.6 kg (160 lb)  07/13/15 75.3 kg (166 lb)    Physical Exam:  BP 109/69 (BP Location: Right Arm)   Pulse (!) 59   Temp 98.4 F (36.9 C) (Oral)   Resp 16   SpO2 99%   General: No acute distress. Well-developed. Well-nourished. HENT: Normocephalic. Atraumatic. Eyes: EOMI. No discharge. Heart: RRR. No JVD. Lungs: Clear to auscultation, breathing unlabored Abdomen: Positive bowel sounds, nondistended Musculoskeletal: No edema or tenderness in extremities Neurologic: alert and oriented Motor: 5/5 in R deltoid, bicep, tricep, grip, hip flexor, knee extensors, ankle dorsiflexor and plantar flexor 4+/5 left deltoid bicep tricep grip (stable) 4/5 left hip flexor, knee extensor, ankle dorsiflexor (table) Skin: warm and dry. Intact. Psych: Mood and affect are appropriate  Assessment/Plan: 1. 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 Physiatrist is providing close team supervision and 24 hour management of active medical problems listed  below. Physiatrist and rehab team continue to assess barriers to discharge/monitor patient progress toward functional and medical goals.  Function:  Bathing Bathing position   Position: Shower  Bathing parts Body parts bathed by patient: Right arm, Left arm, Chest, Front perineal area, Abdomen, Buttocks, Right upper leg, Left upper leg, Right lower leg, Left lower leg, Back Body parts bathed by helper: Back  Bathing assist Assist Level: More than reasonable time      Upper Body Dressing/Undressing Upper body dressing   What is the patient wearing?: Pull over shirt/dress     Pull over shirt/dress - Perfomed by patient: Thread/unthread right sleeve, Thread/unthread left sleeve, Put head through opening, Pull shirt over trunk          Upper body assist Assist Level: No help, No cues      Lower Body Dressing/Undressing Lower body dressing   What is the patient wearing?: Underwear, Socks, Shoes, Pants Underwear - Performed by patient: Thread/unthread right underwear leg, Thread/unthread left underwear leg, Pull underwear up/down   Pants- Performed by patient: Thread/unthread right pants leg, Thread/unthread left pants leg, Pull pants up/down       Socks - Performed by patient: Don/doff right sock, Don/doff left sock   Shoes - Performed by patient: Don/doff right shoe, Don/doff left shoe, Fasten right, Fasten left            Lower body assist Assist for lower body dressing: More than reasonable time      Toileting Toileting   Toileting steps completed by patient: Adjust clothing prior  to toileting, Performs perineal hygiene, Adjust clothing after toileting Toileting steps completed by helper: Adjust clothing prior to toileting, Adjust clothing after toileting Toileting Assistive Devices: Grab bar or rail  Toileting assist Assist level: More than reasonable time   Transfers Chair/bed transfer   Chair/bed transfer method: Ambulatory Chair/bed transfer assist level: No  Help, no cues, assistive device, takes more than a reasonable amount of time Chair/bed transfer assistive device: Medical sales representative     Max distance: 282ft Assist level: No help, No cues, assistive device, takes more than a reasonable amount of time   Wheelchair          Cognition Comprehension Comprehension assist level: Follows complex conversation/direction with no assist  Expression Expression assist level: Expresses complex ideas: With no assist  Social Interaction Social Interaction assist level: Interacts appropriately with others - No medications needed.  Problem Solving Problem solving assist level: Solves complex problems: Recognizes & self-corrects  Memory Memory assist level: Complete Independence: No helper    Medical Problem List and Plan: 1.   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 DVT Prophylaxis/Anticoagulation: Pharmaceutical:Lovenox 3.LBP/Pain Management:Resumed gabapentin to help with LLE radiculopathy. 4. Mood:LCSW to follow for evaluation and support. 5. Neuropsych: This patientiscapable of making decisions on hisown behalf. 6.Skin/Wound Care:routine pressure relief measures. 7. Fluids/Electrolytes/Nutrition:  BMP within acceptable range on 5/28 8. HTN: Monitor BP bid. Continue   Lisinopril bid, decreased to 10 mg twice a day on 5/25, decrease to daily on 5/28.   Vitals:   06/12/17 2023 06/13/17 0421  BP: (!) 152/98 109/69  Pulse: 87 (!) 59  Resp: 18 16  Temp: 97.8 F (36.6 C) 98.4 F (36.9 C)  SpO2: 97% 99%   Remained labile on 5/29, will need ambulatory follow-up 9. Dyslipidemia: On Lipitor now. 10. Constipation: scheduled miralax to bid.  11. BPH: On flomax.  Voiding without difficulty  LOS (Days) 6 A FACE TO FACE EVALUATION WAS PERFORMED  Ginnette Gates Lorie Phenix 06/13/2017 9:11 AM

## 2017-06-13 NOTE — Progress Notes (Signed)
Social Work  Discharge Note  The overall goal for the admission was met for:   Discharge location: Yes - home with family providing any assist, however, pt met mod ind goals  Length of Stay: Yes - 6 days  Discharge activity level: Yes - modified independent  Home/community participation: Yes  Services provided included: MD, RD, PT, OT, SLP, RN, Pharmacy and [place]: Private Insurance: [employer]  Follow-up services arranged: Outpatient: PT, OT @ [facility], DME: rolling walker via [place] and Patient/Family has no preference for HH/DME agencies  Comments (or additional information):  Patient/Family verbalized understanding of follow-up arrangements: Yes  Individual responsible for coordination of the follow-up plan: pt  Confirmed correct DME delivered: Quante Pettry 06/13/2017    Tishawna Larouche

## 2017-06-13 NOTE — Progress Notes (Signed)
Pt being d/c home with personal belongings. Discharge instructions being provided to pt by P. Love, PAC. Pt verbalized an understanding of discharge instructions, understanding of medications as well as f/u appts. Equipment pending. Will continue to monitor.

## 2017-06-14 ENCOUNTER — Telehealth: Payer: Self-pay

## 2017-06-14 NOTE — Telephone Encounter (Signed)
Transitional Care call--patient Robert Sandoval    1. Are you/is patient experiencing any problems since coming home? Pulse rate high 2.  Are there any questions regarding any aspect of care? No 3. Are there any questions regarding medications administration/dosing? No Are meds being taken as prescribed? Pt states didn't receive Plavix, he will call pharmacy and check to see if they have prescription and it not he will call back  4. Have there been any falls? No 5. Has Home Health been to the house and/or have they contacted you? No home health doing outpatient rehab and he does have an appt set up at [facility] 6. Are bowels and bladder emptying properly? yes  7. Any fevers, problems with breathing, unexpected pain? No 8. Are there any skin problems or new areas of breakdown? No 9. Has the patient/family member arranged specialty MD follow up (ie cardiology/neurology/renal/surgical/etc)? Yes Can we help arrange? no 10. Does the patient need any other services or support that we can help arrange? No  11. Are caregivers following through as expected in assisting the patient? Yes 12. Has the patient quit smoking, drinking alcohol, or using drugs as recommended? No  Appointment time 06/22/17 arrive 1:00 for 1:20  [address] [employer] [address]

## 2017-06-19 ENCOUNTER — Encounter: Payer: Self-pay | Admitting: Family Medicine

## 2017-06-19 ENCOUNTER — Ambulatory Visit (INDEPENDENT_AMBULATORY_CARE_PROVIDER_SITE_OTHER): Payer: Managed Care, Other (non HMO) | Admitting: Family Medicine

## 2017-06-19 VITALS — BP 128/82 | HR 88 | Temp 98.1°F | Resp 17 | Ht 68.0 in | Wt 162.0 lb

## 2017-06-19 DIAGNOSIS — R269 Unspecified abnormalities of gait and mobility: Secondary | ICD-10-CM | POA: Diagnosis not present

## 2017-06-19 DIAGNOSIS — I639 Cerebral infarction, unspecified: Secondary | ICD-10-CM | POA: Diagnosis not present

## 2017-06-19 DIAGNOSIS — E785 Hyperlipidemia, unspecified: Secondary | ICD-10-CM | POA: Diagnosis not present

## 2017-06-19 DIAGNOSIS — I69398 Other sequelae of cerebral infarction: Secondary | ICD-10-CM

## 2017-06-19 DIAGNOSIS — I10 Essential (primary) hypertension: Secondary | ICD-10-CM

## 2017-06-19 DIAGNOSIS — Z7902 Long term (current) use of antithrombotics/antiplatelets: Secondary | ICD-10-CM

## 2017-06-19 LAB — CBC
HEMATOCRIT: 42.4 % (ref 37.5–51.0)
HEMOGLOBIN: 14.3 g/dL (ref 13.0–17.7)
MCH: 28.8 pg (ref 26.6–33.0)
MCHC: 33.7 g/dL (ref 31.5–35.7)
MCV: 85 fL (ref 79–97)
Platelets: 290 10*3/uL (ref 150–450)
RBC: 4.97 x10E6/uL (ref 4.14–5.80)
RDW: 14 % (ref 12.3–15.4)
WBC: 5.6 10*3/uL (ref 3.4–10.8)

## 2017-06-19 NOTE — Patient Instructions (Addendum)
If blood pressure remains over 130/80 at home, then take additional 5mg  of lisinopril (1/2 of the 10mg  pill).  No change dose of cholesterol med for now.  We can recheck levels in 1 month.  Stop aspirin after June 12th.  Continue outpatient rehab as planned and follow up with physical medicine and rehab.  Returning to work or driving needs to be determined by your neurologist.  Thank you for coming in today.    IF you received an x-ray today, you will receive an invoice from [facility]. Please contact [facility] at [phone] with questions or concerns regarding your invoice.   IF you received labwork today, you will receive an invoice from [facility]. Please contact [facility] at [phone] with questions or concerns regarding your invoice.   Our billing staff will not be able to assist you with questions regarding bills from these companies.  You will be contacted with the lab results as soon as they are available. The fastest way to get your results is to activate your My Chart account. Instructions are located on the last page of this paperwork. If you have not heard from [place] regarding the results in 2 weeks, please contact this office.

## 2017-06-19 NOTE — Progress Notes (Signed)
Subjective:  By signing my name below, I, Robert Sandoval, attest that this documentation has been prepared under the direction and in the presence of [name], MD Electronically Signed: [name], ED Scribe 06/19/2017 at 11:24 AM.   Patient ID: Robert Sandoval, male    DOB: Jun 09, 1960, 57 y.o.   MRN: [id number]  Chief Complaint  Patient presents with  . Follow-up    stroke    HPI [name] is a 57 y.o. male who presents to [facility] at [facility] for hospital f/u. Last seen by Dr. Linna Darner in July 2018. H/o HTN, hyperlipidemia. Admitted 5/21-5/23 at [facility] for acute ischemic stroke R basal ganglia. Received IV tPA. Noted to have spastic hemiparesis of L nondominant side due to cerebral infarction with alteration of gait. Initially presented after waking with L arm and leg weakness. tPA was administered. Stroke thought to be due small vessel disease with HTN and hyperlipidemia. CTA head and neck was unremarkable. EF 60-65% on echo. Started on Lipitor 20 mg due to LDL 87. A1C was 5.6. Aspirin 81 mg prior to CVA. Continued on aspirin, Plavix  75 mg added qd x 3 wks then plan for Plavix alone. Lisinopril was increased to 20 mg bid. Decreased to 10 mg qd due to hypotension. Initially discharged to in patient rehab from 5/23-29 for hemiparesis with PT/OT. Planned for continued outpatient PT/OT at [facility]. Planned for recheck CBC today and discontinue aspirin after 6/12. Plan for f/u with physical med and rehab on 6/7 with Dr. Posey Pronto.  Pt states that he still has some numbness/weakness in the L arm but L leg weakness has improved significantly. Denies new myalgias or side-effects with Lipitor. Has been taking gabapentin 300 mg tid for flare-up of sciatic which he attributes to ambulating with a cane since stroke. Pt states that his BP this morning was 128/80 prior to meds. Denies blood in stools, hematuria, any other side effects from Plavix. Denies feeling down, depressed,  anxious. Denies difficulty eating/drinking, bathing, using the bathroom. Pt lives with his wife and he is out of work until he is cleared by his neurologist.  Patient Active Problem List   Diagnosis Date Noted  . Hypotension due to drugs   . Labile blood pressure   . Benign prostatic hyperplasia   . Hemiparesis affecting left side as late effect of stroke ([name])   . Basal ganglia infarction ([place]) 06/07/2017  . 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 ([place])   . Gait disturbance, post-stroke   . Hyperlipidemia   . Acute ischemic stroke ([place]) - R basal ganglia s/p IV tPA 06/05/2017  . Prostate nodule 10/30/2016  . Essential hypertension 08/05/2016  . External hemorrhoids without complication 63/01/6008  . Melanosis of colon 11/23/2015  . Diverticulosis of colon without hemorrhage 11/23/2015  . Internal hemorrhoids 11/23/2015  . Essential hypertension, benign 09/14/2014   Past Medical History:  Diagnosis Date  . Allergy   . BPH (benign prostatic hyperplasia)    followed by [facility]  . Hyperlipidemia   . Hypertension   . Low back pain radiating to left lower extremity    sees Dr. Tonita Cong   No past surgical history on file. No Known Allergies Prior to Admission medications   Medication Sig Start Date End Date Taking? Authorizing Provider  acetaminophen (TYLENOL) 325 MG tablet Take 1-2 tablets (325-650 mg total) by mouth every 4 (four) hours as needed for mild pain. 06/13/17  Yes Love,  [name], PA-C  aspirin EC 81 MG EC tablet Take 1 tablet (81 mg total) by mouth daily. 06/08/17  Yes [name], NP  atorvastatin (LIPITOR) 20 MG tablet Take 1 tablet (20 mg total) by mouth daily at 6 PM. 06/13/17  Yes Love, [name], PA-C  clopidogrel (PLAVIX) 75 MG tablet Take 1 tablet (75 mg total) by mouth daily. 06/08/17  Yes [name], NP  fluticasone (FLONASE) 50 MCG/ACT nasal spray Place 1 spray into both nostrils daily. 06/13/17  Yes Love, [name],  PA-C  gabapentin (NEURONTIN) 300 MG capsule Take 1 capsule (300 mg total) by mouth 3 (three) times daily. 06/07/17  Yes [name], NP  lisinopril (PRINIVIL,ZESTRIL) 10 MG tablet Take 1 tablet (10 mg total) by mouth daily. 06/14/17  Yes Love, [name], PA-C  loratadine (CLARITIN) 10 MG tablet Take 1 tablet (10 mg total) by mouth daily. 06/13/17  Yes Love, [name], PA-C  polyethylene glycol (MIRALAX / GLYCOLAX) packet Take 17 g by mouth 2 (two) times daily. 06/13/17  Yes Love, [name], PA-C  tamsulosin (FLOMAX) 0.4 MG CAPS capsule Take 0.4 mg by mouth daily.   Yes [provider]   Social History   Socioeconomic History  . Marital status: Divorced    Spouse name: Not on file  . Number of children: Not on file  . Years of education: Not on file  . Highest education level: Not on file  Occupational History  . Not on file  Social Needs  . Financial resource strain: Not on file  . Food insecurity:    Worry: Not on file    Inability: Not on file  . Transportation needs:    Medical: Not on file    Non-medical: Not on file  Tobacco Use  . Smoking status: Never Smoker  . Smokeless tobacco: Never Used  Substance and Sexual Activity  . Alcohol use: Yes  . Drug use: No  . Sexual activity: Not on file  Lifestyle  . Physical activity:    Days per week: Not on file    Minutes per session: Not on file  . Stress: Not on file  Relationships  . Social connections:    Talks on phone: Not on file    Gets together: Not on file    Attends religious service: Not on file    Active member of club or organization: Not on file    Attends meetings of clubs or organizations: Not on file    Relationship status: Not on file  . Intimate partner violence:    Fear of current or ex partner: Not on file    Emotionally abused: Not on file    Physically abused: Not on file    Forced sexual activity: Not on file  Other Topics Concern  . Not on file  Social History Narrative  . Not on file    Review of Systems  Constitutional: Negative for appetite change.  Gastrointestinal: Negative for blood in stool.  Genitourinary: Negative for hematuria.  Musculoskeletal: Positive for back pain. Negative for myalgias.  Neurological: Positive for weakness (improving) and numbness (improving).  Psychiatric/Behavioral: Negative for dysphoric mood. The patient is not nervous/anxious.       Objective:   Physical Exam  Constitutional: He is oriented to person, place, and time. He appears well-developed and well-nourished.  HENT:  Head: Normocephalic and atraumatic.  Eyes: Pupils are equal, round, and reactive to light. EOM are normal. Right eye exhibits no  nystagmus. Left eye exhibits no nystagmus.  Neck: No JVD present. Carotid bruit is not present.  Cardiovascular: Normal rate, regular rhythm and normal heart sounds.  No murmur heard. Pulmonary/Chest: Effort normal and breath sounds normal. He has no rales.  Musculoskeletal: He exhibits no edema.  Minimal decreased grip on the L vs R.  Neurological: He is alert and oriented to person, place, and time. Gait abnormal.  No pronator drift. Slight decreased sensation on L vs R face. Slight decreased sensation on L vs R arm. Sensation equal on legs. Antalgic gait but able to walk without use of cane in the room.  Skin: Skin is warm and dry.  Psychiatric: He has a normal mood and affect.  Vitals reviewed.  Vitals:   06/19/17 1052  BP: 128/82  Pulse: 88  Resp: 17  Temp: 98.1 F (36.7 C)  TempSrc: Oral  SpO2: 98%  Weight: 162 lb (73.5 kg)  Height: 5\' 8"  (1.727 m)  ~15 mins of chart review.     Assessment & Plan:  Robert Sandoval is a 57 y.o. male Acute ischemic stroke ([place]) - R basal ganglia s/p IV tPA Gait disturbance, post-stroke Hyperlipidemia, unspecified hyperlipidemia type Essential hypertension, benign Antiplatelet or antithrombotic long-term use - Plan: CBC  -Status post ischemic stroke treated with TPA.  Improving with  strength, has resources for physical therapy/Occupational Therapy.  Independent in IADLs/ADLs.    -Blood pressure stable, but option of increasing lisinopril to additional 5 mg if needed for readings below 130/80.   - Tolerating statin, continue Lipitor 20 mg daily.    -denies new bleeding with current antiplatelet therapy.  Plans to stop aspirin after June 12.  CBC obtained to monitor platelets.  -Return to work/driving can be discussed with his neurologist.  -Continue routine follow-up with neurology and physical medicine and rehab.  No orders of the defined types were placed in this encounter.  Patient Instructions   If blood pressure remains over 130/80 at home, then take additional 5mg  of lisinopril (1/2 of the 10mg  pill).  No change dose of cholesterol med for now.  We can recheck levels in 1 month.  Stop aspirin after June 12th.  Continue outpatient rehab as planned and follow up with physical medicine and rehab.  Returning to work or driving needs to be determined by your neurologist.  Thank you for coming in today.    IF you received an x-ray today, you will receive an invoice from [facility]. Please contact [facility] at [phone] with questions or concerns regarding your invoice.   IF you received labwork today, you will receive an invoice from [facility]. Please contact [facility] at [phone] with questions or concerns regarding your invoice.   Our billing staff will not be able to assist you with questions regarding bills from these companies.  You will be contacted with the lab results as soon as they are available. The fastest way to get your results is to activate your My Chart account. Instructions are located on the last page of this paperwork. If you have not heard from [place] regarding the results in 2 weeks, please contact this office.       I personally performed the services described in this documentation, which was scribed in my presence. The  recorded information has been reviewed and considered for accuracy and completeness, addended by me as needed, and agree with information above.  Signed,   Merri Ray, MD Primary Care at [place].  06/22/17 9:31  AM

## 2017-06-21 ENCOUNTER — Ambulatory Visit: Payer: Managed Care, Other (non HMO) | Attending: Family Medicine | Admitting: Occupational Therapy

## 2017-06-21 ENCOUNTER — Encounter: Payer: Self-pay | Admitting: Occupational Therapy

## 2017-06-21 DIAGNOSIS — R270 Ataxia, unspecified: Secondary | ICD-10-CM | POA: Insufficient documentation

## 2017-06-21 DIAGNOSIS — R2681 Unsteadiness on feet: Secondary | ICD-10-CM

## 2017-06-21 DIAGNOSIS — M6281 Muscle weakness (generalized): Secondary | ICD-10-CM | POA: Diagnosis present

## 2017-06-21 DIAGNOSIS — I639 Cerebral infarction, unspecified: Secondary | ICD-10-CM | POA: Diagnosis present

## 2017-06-21 DIAGNOSIS — R278 Other lack of coordination: Secondary | ICD-10-CM | POA: Insufficient documentation

## 2017-06-21 DIAGNOSIS — R2689 Other abnormalities of gait and mobility: Secondary | ICD-10-CM | POA: Insufficient documentation

## 2017-06-21 NOTE — Therapy (Addendum)
[facility] [address] [place], [name], [address] Phone: [phone]   Fax:  [phone]  Occupational Therapy Evaluation  Patient Details  Name: Robert Sandoval MRN: [id number] Date of Birth: Jan 07, 1961 Referring Provider: Reesa Chew PA-C   Encounter Date: 06/21/2017  OT End of Session - 06/21/17 1210    Visit Number  1    Number of Visits  17    Date for OT Re-Evaluation  08/20/17    Authorization Type  [employer] covered 100% 60 visit limit for each OT/PT    OT Start Time  0830    OT Stop Time  0908    OT Time Calculation (min)  38 min    Activity Tolerance  Patient tolerated treatment well    Behavior During Therapy  [facility] for tasks assessed/performed       Past Medical History:  Diagnosis Date  . Allergy   . BPH (benign prostatic hyperplasia)    followed by [facility]  . Hyperlipidemia   . Hypertension   . Low back pain radiating to left lower extremity    sees Dr. Tonita Cong    History reviewed. No pertinent surgical history.  There were no vitals filed for this visit.  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  No/denies        [facility] OT Assessment - 06/21/17 0840      Assessment   Medical Diagnosis  R basal ganglia CVA    Referring Provider  Reesa Chew PA-C    Onset Date/Surgical Date  06/05/17    Hand Dominance  Right    Prior Therapy  hospitalized 06/05/17-06/13/17 including CIR      Precautions   Precautions  Fall      Balance Screen   Has the patient fallen in the past 6 months  No      Home  Environment   Family/patient expects to be discharged to:  Private residence    Type of [place] 3    Lives With  Spouse      Prior Function   Level of Independence  Independent    Vocation  Full time employment Driver for [employer]  enjoy walking in park with wife      ADL   Eating/Feeding  Modified independent    Grooming  Modified independent    Lower Body Bathing  Modified independent    Upper Body Dressing  Independent    Lower Body Dressing  Modified independent    Toilet Transfer  Modified independent    Toileting - Clothing Manipulation  Modified independent    Tub/Shower Transfer  Modified independent    ADL comments  BADLs mod I with incr time, fatigue with holding wheel with driving (only driving with wife), difficulty holding dishes      IADL   Prior Level of Function Shopping  independent    Shopping  Needs to be accompanied on any shopping trip with wife, walks with cart    Prior Level of Function Light Housekeeping  independent (shared with wife), wife performing now  Light Housekeeping  -- has vacuumed, fatigue    Prior Level of Function Meal Prep  wife performs most, pt has been able to fix breakfast    Prior Level of Function Community Mobility  independent    Community Mobility  -- driven 1x with wife short distance      Mobility   Mobility Status Comments  ambulates with single-point cane in the home and community (not using RW anymore) see PT eval for details      Written Expression   Dominant Hand  Right      Vision - History   Baseline Vision  Wears glasses all the time WNL per pt report      Activity Tolerance   Activity Tolerance Comments  fatigues quickly      Cognition   Overall Cognitive Status  Within Functional Limits for tasks assessed      Sensation   Additional Comments  numbness/tingling in L forearm/hand, and foot      Coordination   Gross Motor Movements are Fluid and Coordinated  No    Fine Motor Movements are Fluid and Coordinated  No    Coordination and Movement Description  ataxia     Finger Nose Finger Test  decr control and speed with LUE    9 Hole Peg Test  Right;Left    Right 9 Hole Peg Test  23.90    Left 9 Hole Peg Test  35.28      ROM / Strength   AROM / PROM / Strength  AROM;Strength      AROM   Overall AROM   Within functional limits for tasks performed    Overall AROM Comments  BUEs WNL, but LUE with decr control      Strength   Overall Strength  Deficits    Overall Strength Comments  Proximal UE strength:  RUE 5/5, LUE grossly 4 to 4+/5      Hand Function   Right Hand Grip (lbs)  80    Left Hand Grip (lbs)  57                      OT Education - 06/21/17 1157    Education Details  OT POC/eval results; beginning activities for home including picking up small objects and reaching to retrieve/replace light objects on overhead shelf, turning on light switches with LUE, etc    Person(s) Educated  Patient    Methods  Explanation    Comprehension  Verbalized understanding       OT Short Term Goals - 06/21/17 1222      OT SHORT TERM GOAL #1   Title  Pt will be independent with HEP for coordination.--check STGs 07/21/17    Time  4    Period  Weeks    Status  New      OT SHORT TERM GOAL #2   Title  Pt will improve coordination for ADLs as shown by completing 9-hole peg test in less than 26sec.    Baseline  35.28sec    Time  4    Period  Weeks    Status  New      OT SHORT TERM GOAL #3   Title  Pt will improve L grip strength by at least 6lbs for IADLs.      Baseline  57lbs    Time  4    Period  Weeks    Status  New      OT SHORT  TERM GOAL #4   Title  Pt will perform dynamic standing activities/IADL tasks for at least 27min without LOB or rest break.    Time  4    Period  Weeks        OT Long Term Goals - 06/21/17 1227      OT LONG TERM GOAL #1   Title  Pt will be independent with strengthening HEP.--check LTGs 08/21/17    Time  8    Period  Weeks    Status  New      OT LONG TERM GOAL #2   Title  Pt will perform  targeted overhead reaching for at least 32min without rest breaks and only 2 drops or less.      Time  8    Period  Weeks    Status  New      OT LONG TERM GOAL #3   Title  Pt will improve L grip strength by at least 12lbs for IADLs.      Baseline  57lbs    Time  8    Period  Weeks    Status  New      OT LONG TERM GOAL #4   Title  Pt will perform dynamic standing activities/IADL tasks for at least 75min without LOB or rest break (including lifting/carrying/reaching tasks).    Time  8    Period  Weeks    Status  New      OT LONG TERM GOAL #5   Title  Pt will perform all previous home maintenance tasks mod I.    Time  8    Period  Weeks    Status  New            Plan - 06/21/17 1214    Clinical Impression Statement  Pt is a 58 y.o. male s/p R basal ganglia CVA with hospitalization 06/05/17-06/13/17.  Pt with PMH that includes:  HTN, hyperlipidemia low back pain with left leg extremity radiculopathy.  Pt presents with decr strength, decr coordination, ataxia, decr balance for ADLs, and decr LUE functional use.  Pt would benefit from occupational thearpy to address these deficits to improve LUE functional use, ADL/IADL performance, and to be able to return to work.    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  Pt is unable to work and perform all previous IADLs currently due to deficits.    Occupational performance deficits (Please refer to evaluation for details):  ADL's;IADL's;Leisure    Rehab Potential  Good    OT Frequency  2x / week    OT Duration  8 weeks +eval    OT Treatment/Interventions  Self-care/ADL training;Therapeutic exercise;Patient/family education;Neuromuscular education;Moist Heat;Fluidtherapy;Energy conservation;Therapist, nutritional;Therapeutic activities;Balance training;Manual Therapy;Passive range of motion;DME and/or AE  instruction;Ultrasound;Cryotherapy    Plan  initiate HEP for coordination and hand strength    Clinical Decision Making  Limited treatment options, no task modification necessary    Consulted and Agree with Plan of Care  Patient       Patient will benefit from skilled therapeutic intervention in order to improve the following deficits and impairments:  Decreased coordination, Decreased mobility, Impaired sensation, Decreased strength, Decreased endurance, Decreased activity tolerance, Decreased balance, Impaired UE functional use  Visit Diagnosis: Other lack of coordination - Plan: Ot plan of care cert/re-cert  Muscle weakness (generalized) - Plan: Ot plan of care cert/re-cert  Ataxia - Plan: Ot plan of care cert/re-cert  Unsteadiness on feet - Plan: Ot plan of care cert/re-cert  Other abnormalities of gait and mobility - Plan: Ot plan of care cert/re-cert    Problem List Patient Active Problem List   Diagnosis Date Noted  . Hypotension due to drugs   . Labile blood pressure   . Benign prostatic hyperplasia   . Hemiparesis affecting left side as late effect of stroke ([place])   . Basal ganglia infarction ([place]) 06/07/2017  . 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 Gait disturbance, post-stroke   . Hyperlipidemia   . Acute ischemic stroke ([place]) - R basal ganglia s/p IV tPA 06/05/2017  . Prostate nodule 10/30/2016  . Essential hypertension 08/05/2016  . External hemorrhoids without complication 38/93/7342  . Melanosis of colon 11/23/2015  . Diverticulosis of colon without hemorrhage 11/23/2015  . Internal hemorrhoids 11/23/2015  . Essential hypertension, benign 09/14/2014    [facility] 06/21/2017, 3:01 PM  [facility] [address] [place], [name], [address] Phone: [phone]   Fax:  [phone]  Name: Merritt Mccravy MRN: [id number] Date of Birth: [birth year]    Vianne Bulls, OTR/L [facility] [address] [place], [place]  [address] [phone] phone [phone] 06/21/17 3:01 PM

## 2017-06-22 ENCOUNTER — Other Ambulatory Visit: Payer: Self-pay

## 2017-06-22 ENCOUNTER — Encounter: Payer: Self-pay | Admitting: Physical Medicine & Rehabilitation

## 2017-06-22 ENCOUNTER — Encounter: Payer: Self-pay | Admitting: Family Medicine

## 2017-06-22 ENCOUNTER — Encounter
Payer: Managed Care, Other (non HMO) | Attending: Physical Medicine & Rehabilitation | Admitting: Physical Medicine & Rehabilitation

## 2017-06-22 VITALS — BP 126/87 | HR 94 | Ht 68.0 in | Wt 162.2 lb

## 2017-06-22 DIAGNOSIS — I6381 Other cerebral infarction due to occlusion or stenosis of small artery: Secondary | ICD-10-CM

## 2017-06-22 DIAGNOSIS — M545 Low back pain: Secondary | ICD-10-CM | POA: Insufficient documentation

## 2017-06-22 DIAGNOSIS — N400 Enlarged prostate without lower urinary tract symptoms: Secondary | ICD-10-CM | POA: Insufficient documentation

## 2017-06-22 DIAGNOSIS — E785 Hyperlipidemia, unspecified: Secondary | ICD-10-CM | POA: Diagnosis not present

## 2017-06-22 DIAGNOSIS — I69354 Hemiplegia and hemiparesis following cerebral infarction affecting left non-dominant side: Secondary | ICD-10-CM

## 2017-06-22 DIAGNOSIS — R269 Unspecified abnormalities of gait and mobility: Secondary | ICD-10-CM

## 2017-06-22 DIAGNOSIS — M5442 Lumbago with sciatica, left side: Secondary | ICD-10-CM

## 2017-06-22 DIAGNOSIS — I639 Cerebral infarction, unspecified: Secondary | ICD-10-CM

## 2017-06-22 DIAGNOSIS — R5383 Other fatigue: Secondary | ICD-10-CM | POA: Diagnosis not present

## 2017-06-22 DIAGNOSIS — I69398 Other sequelae of cerebral infarction: Secondary | ICD-10-CM

## 2017-06-22 DIAGNOSIS — I10 Essential (primary) hypertension: Secondary | ICD-10-CM | POA: Diagnosis not present

## 2017-06-22 DIAGNOSIS — G8929 Other chronic pain: Secondary | ICD-10-CM

## 2017-06-22 NOTE — Progress Notes (Signed)
Subjective:    Patient ID: Robert Sandoval, male    DOB: 1960/10/13, 57 y.o.   MRN: [id number]  HPI 57 year old right-handed male of [place] descent with history of hypertension, hyperlipidemia, low back pain with left leg extremity radiculopathy, presents for transitional care management after receiving CIR  for right basal ganglia/corona radiata and occipital infracts.  Admit date: 06/07/2017 Discharge date: 06/13/2017  At discharge he was instructed to follow up with PCP and blood work ordered. He has a follow up with Neurology. BP is controlled.  He sees Ortho for back pain.  No issues with bowel/bladder function.  Easy fatigability and dysesthesias are main concerns. Denies falls. He states he returned to driving.  Therapies: 2/week starting next week. DME: None needed Mobility: Cane majority of time  Pain Inventory Average Pain 0 Pain Right Now 0 My pain is tingling  In the last 24 hours, has pain interfered with the following? General activity 4 Relation with others 9 Enjoyment of life 8 What TIME of day is your pain at its worst? morning Sleep (in general) Good  Pain is worse with: walking Pain improves with: therapy/exercise Relief from Meds: 7  Mobility use a cane use a walker ability to climb steps?  yes do you drive?  no transfers alone  Function employed # of hrs/week n/a I need assistance with the following:  household duties and shopping  Neuro/Psych numbness tingling trouble walking  Prior Studies Any changes since last visit?  no  Physicians involved in your care Primary care Dr Carlota Raspberry   Family History  Problem Relation Age of Onset  . Hyperlipidemia Father   . Asthma Father    Social History   Socioeconomic History  . Marital status: Divorced    Spouse name: Not on file  . Number of children: Not on file  . Years of education: Not on file  . Highest education level: Not on file  Occupational History  . Not on file  Social Needs    . Financial resource strain: Not on file  . Food insecurity:    Worry: Not on file    Inability: Not on file  . Transportation needs:    Medical: Not on file    Non-medical: Not on file  Tobacco Use  . Smoking status: Never Smoker  . Smokeless tobacco: Never Used  Substance and Sexual Activity  . Alcohol use: Yes  . Drug use: No  . Sexual activity: Not on file  Lifestyle  . Physical activity:    Days per week: Not on file    Minutes per session: Not on file  . Stress: Not on file  Relationships  . Social connections:    Talks on phone: Not on file    Gets together: Not on file    Attends religious service: Not on file    Active member of club or organization: Not on file    Attends meetings of clubs or organizations: Not on file    Relationship status: Not on file  Other Topics Concern  . Not on file  Social History Narrative  . Not on file   No past surgical history on file. Past Medical History:  Diagnosis Date  . Allergy   . BPH (benign prostatic hyperplasia)    followed by [facility]  . Hyperlipidemia   . Hypertension   . Low back pain radiating to left lower extremity    sees Dr. Tonita Cong   BP 126/87   Pulse 94  Ht 5\' 8"  (1.727 m)   Wt 162 lb 3.2 oz (73.6 kg)   BMI 24.66 kg/m   Opioid Risk Score:   Fall Risk Score:  `1  Depression screen PHQ 2/9  Depression screen [facility] 2/9 06/22/2017 06/19/2017 08/05/2016 07/13/2015 05/01/2015 11/05/2014 10/25/2014  Decreased Interest 0 0 0 0 0 0 0  Down, Depressed, Hopeless 0 0 0 0 0 0 0  PHQ - 2 Score 0 0 0 0 0 0 0   Review of Systems  Constitutional: Negative.   HENT: Negative.   Eyes: Negative.   Respiratory: Negative.   Cardiovascular: Negative.   Gastrointestinal: Negative.   Endocrine: Negative.   Genitourinary: Negative.   Musculoskeletal: Positive for gait problem.  Skin: Negative.   Allergic/Immunologic: Negative.   Neurological: Positive for dizziness and numbness.  Hematological: Negative.    Psychiatric/Behavioral: Negative.   All other systems reviewed and are negative.     Objective:   Physical Exam General: No acute distress. Well-developed. Well-nourished. HENT: Normocephalic. Atraumatic. Eyes: EOMI. No discharge. Heart: RRR. No JVD. Lungs: Clear to auscultation, breathing unlabored Abdomen: Positive bowel sounds, nondistended Musculoskeletal: No edema or tenderness in extremities Neurologic: alert and oriented Motor:  4/5 left deltoid bicep tricep grip  4/5 left hip flexor, knee extensor, ankle dorsiflexor  Sensation diminished to light touch left foot Skin: warm and dry. Intact. Psych: Mood and affect are appropriate    Assessment & Plan:  57 year old right-handed male of [place] descent with history of hypertension, hyperlipidemia, low back pain with left leg extremity radiculopathy, presents for transitional care management after receiving CIR for right basal ganglia/corona radiata and occipital infracts.  1.  Left hemiparesis  secondary to right basal ganglia/corona radiata and occipital infarcts  Fatigue and dysesthesias main complaints  Cont therapies  Follow up with Neurology  Pt to stop ASA in 5 days  Discussed gradual return to driving after 1/57 with licensed driver  Does not want medication for dysesthesias  Will consider medication for fatigue   2. LBP/Pain Management:   Following up with Ortho  3. HTN:   Controlled at present  Cont to monitor and follow up with PCP  4. Gait abnormality  Cont therapies  Cont cane for safety  Meds reviewed Referrals reviewed All questions answered

## 2017-06-25 ENCOUNTER — Ambulatory Visit: Payer: Managed Care, Other (non HMO) | Admitting: Physical Therapy

## 2017-06-25 ENCOUNTER — Other Ambulatory Visit: Payer: Self-pay

## 2017-06-25 ENCOUNTER — Encounter: Payer: Self-pay | Admitting: Physical Therapy

## 2017-06-25 DIAGNOSIS — R2689 Other abnormalities of gait and mobility: Secondary | ICD-10-CM

## 2017-06-25 DIAGNOSIS — R2681 Unsteadiness on feet: Secondary | ICD-10-CM

## 2017-06-25 DIAGNOSIS — R278 Other lack of coordination: Secondary | ICD-10-CM | POA: Diagnosis not present

## 2017-06-25 DIAGNOSIS — I639 Cerebral infarction, unspecified: Secondary | ICD-10-CM

## 2017-06-25 NOTE — Therapy (Signed)
[facility] [address] [name], [name], [address] Phone: [phone]   Fax:  [phone]  Physical Therapy Evaluation  Patient Details  Name: Robert Sandoval MRN: [id number] Date of Birth: [birth year] Referring Provider: Delice Lesch MD   Encounter Date: 06/25/2017  PT End of Session - 06/25/17 1255    Visit Number  1    Number of Visits  17    Date for PT Re-Evaluation  08/24/17    Authorization - Visit Number  1    Authorization - Number of Visits  60    PT Start Time  0848    PT Stop Time  0933    PT Time Calculation (min)  45 min    Activity Tolerance  Patient tolerated treatment well    Behavior During Therapy  [facility] for tasks assessed/performed       Past Medical History:  Diagnosis Date  . Allergy   . BPH (benign prostatic hyperplasia)    followed by [facility]  . Hyperlipidemia   . Hypertension   . Low back pain radiating to left lower extremity    sees Dr. Tonita Cong    History reviewed. No pertinent surgical history.  There were no vitals filed for this visit.   Subjective Assessment - 06/25/17 0850    Subjective  My ankle is still weak, it tires easily. 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  No/denies         [facility] PT Assessment - 06/25/17 0852      Assessment   Medical Diagnosis  R basal ganglia CVA    Referring Provider  Delice Lesch MD    Onset Date/Surgical Date  06/05/17    Hand Dominance  Right    Prior Therapy  hospitalized 06/05/17-06/13/17 including CIR      Precautions   Precautions  Fall      Restrictions   Weight Bearing Restrictions  No      Balance Screen   Has the patient fallen in the past 6 months  No    Has the patient had a decrease in  activity level because of a fear of falling?   Yes    Is the patient reluctant to leave their home because of a fear of falling?   Yes      [place]  Private residence    Living Arrangements  Spouse/significant other    Available Help at Discharge  Family;Available PRN/intermittently    Type of Home  Apartment    Home Access  Stairs to enter    Entrance Stairs-Number of Steps  3    Entrance Stairs-Rails  Left    Home Layout  One level    [place] - 2 wheels;Cane - single point;Grab bars - tub/shower      Prior Function   Level of Independence  Independent    Vocation  Full time Animator for [employer]  enjoy walking in park with wife      Cognition   Overall Cognitive Status  Within Functional Limits for tasks assessed      Sensation   Additional Comments  numbness/tingling in L  forearm/hand and left leg      Coordination   Gross Motor Movements are Fluid and Coordinated  Yes    Fine Motor Movements are Fluid and Coordinated  No could not coordinate RAM (toe tapping)      Posture/Postural Control   Posture/Postural Control  No significant limitations      Tone   Assessment Location  Left Lower Extremity      ROM / Strength   AROM / PROM / Strength  AROM;Strength      AROM   Overall AROM   Within functional limits for tasks performed    Overall AROM Comments  bil LEs      Strength   Overall Strength  Deficits    Overall Strength Comments  --    Strength Assessment Site  Hip;Knee;Ankle    Right/Left Hip  Left    Left Hip Flexion  3+/5    Left Hip Extension  3/5    Left Hip ABduction  3-/5    Right/Left Knee  Left    Left Knee Flexion  3+/5    Left Knee Extension  4-/5    Right/Left Ankle  Left    Left Ankle Dorsiflexion  4/5      Transfers   Five time sit to stand comments   11.1      Ambulation/Gait   Ambulation/Gait  Yes    Ambulation/Gait Assistance  5: Supervision    Ambulation/Gait  Assistance Details  LLE shows fatigue    Ambulation Distance (Feet)  80 Feet    Assistive device  None    Gait Pattern  Step-through pattern;Decreased arm swing - left;Decreased step length - right;Decreased stance time - left;Decreased dorsiflexion - left;Decreased weight shift to left;Trendelenburg    Gait velocity  32.8/9.84=3.33 ft/sec    Stairs  Yes    Stairs Assistance  6: Modified independent (Device/Increase time)    Stair Management Technique  One rail Left;Alternating pattern    Number of Stairs  4    Height of Stairs  6      Functional Gait  Assessment   Gait assessed   Yes    Gait Level Surface  Walks 20 ft in less than 7 sec but greater than 5.5 sec, uses assistive device, slower speed, mild gait deviations, or deviates 6-10 in outside of the 12 in walkway width. 5.88 sec    Change in Gait Speed  Able to change speed, demonstrates mild gait deviations, deviates 6-10 in outside of the 12 in walkway width, or no gait deviations, unable to achieve a major change in velocity, or uses a change in velocity, or uses an assistive device.    Gait with Horizontal Head Turns  Performs head turns smoothly with slight change in gait velocity (eg, minor disruption to smooth gait path), deviates 6-10 in outside 12 in walkway width, or uses an assistive device.    Gait with Vertical Head Turns  Performs task with slight change in gait velocity (eg, minor disruption to smooth gait path), deviates 6 - 10 in outside 12 in walkway width or uses assistive device    Gait and Pivot Turn  Pivot turns safely within 3 sec and stops quickly with no loss of balance.    Step Over Obstacle  Is able to step over 2 stacked shoe boxes taped together (9 in total height) without changing gait speed. No evidence of imbalance.    Gait with Narrow Base of Support  Ambulates 4-7 steps. 4  Gait with Eyes Closed  Walks 20 ft, slow speed, abnormal gait pattern, evidence for imbalance, deviates 10-15 in outside 12 in  walkway width. Requires more than 9 sec to ambulate 20 ft.    Ambulating Backwards  Walks 20 ft, slow speed, abnormal gait pattern, evidence for imbalance, deviates 10-15 in outside 12 in walkway width.    Steps  Alternating feet, must use rail.    Total Score  19      LLE Tone   LLE Tone  Modified Ashworth      LLE Tone   Modified Ashworth Scale for Grading Hypertonia LLE  Slight increase in muscle tone, manifested by a catch and release or by minimal resistance at the end of the range of motion when the affected part(s) is moved in flexion or extension        Treatment- Instructed pt in prone knee flexion and prone hip extension, however after only 3 reps of each, he reported it increased his back pain.  Instructed in standing knee flexion with same result.   Instructed in hip abdct (see pt instructions) and seated hamstring curl. With green band.        Objective measurements completed on examination: See above findings.              PT Education - 06/25/17 1254    Education Details  comparisons of his scores to norm for age and areas PT can address which will improve his safety with walking; See HEP initiated    Person(s) Educated  Patient    Methods  Explanation;Demonstration;Verbal cues;Handout;Other (comment) sent via text message    Comprehension  Verbalized understanding;Returned demonstration;Verbal cues required;Need further instruction       PT Short Term Goals - 06/25/17 1956      PT SHORT TERM GOAL #1   Title  Patient will be independent with HEP (Target for all STGs 07/23/2017)    Time  4    Period  Weeks    Status  New    Target Date  07/23/17      PT SHORT TERM GOAL #2   Title  Patient will improve 5x sit to stand to <=9.0 seconds to demonstrate improved LE strength.    Time  4    Period  Weeks    Status  New      PT SHORT TERM GOAL #3   Title  Patient will improve FGA to >=23/30 to demonstrate decreasing fall risk.     Time  4    Period   Weeks    Status  New      PT SHORT TERM GOAL #4   Title  Patient will ambulate up to 500 ft over unlevel outdoor surfaces, including up/down curbs with no assistive device and supervision.    Time  4    Period  Weeks    Status  New        PT Long Term Goals - 06/25/17 2000      PT LONG TERM GOAL #1   Title  Patient will be independent with updated HEP and able to state plan for continued activity upon discharge from PT (Target for all LTGs 08/20/2017)    Time  8    Period  Weeks    Status  New    Target Date  08/20/17      PT LONG TERM GOAL #2   Title  Patient will improve FGA >=27/30 demonstrating a measurable improvement in his balance  and gait.    Time  8    Period  Weeks    Status  New      PT LONG TERM GOAL #3   Title  Patient will ambulate >=106ft over level and unlevel outdoor terrain modified independent.     Time  8    Period  Weeks    Status  New             Plan - 06/25/17 1257    Clinical Impression Statement  Patient referred to OPPT s/p Rt basal ganglia/corona radiata and occipital infarcts with resulting left-sided weakness and decreased independence with his mobility. Patient's score of 19/30 on FGA puts pt in a higher fall risk category. 5 times sit to stand in 11.1 seconds (compared to norm for his age of 8.4 seconds) demonstrates decreased LE strength and balance. Patient can benefit from the PT interventions listed below to address the deficits listed below.   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; HTN, hyperlipidemia low back pain with left leg extremity radiculopathy   Personal factors-all positive (prior level of function, emotional response, motivation, expected progression of pt)  (Pended)     Clinical Presentation  Evolving  (Pended)     Clinical Presentation due to:  acute CVA < 1 month since onset  (Pended)     Clinical Decision Making  Low  (Pended)     Rehab Potential  Good   (Pended)     PT Frequency  2x / week  (Pended)     PT Duration  8 weeks  (Pended)     PT Treatment/Interventions  ADLs/Self Care Home Management;Electrical Stimulation;Gait training;DME Instruction;Stair training;Functional mobility training;Therapeutic activities;Therapeutic exercise;Balance training;Neuromuscular re-education;Manual techniques;Orthotic Fit/Training;Patient/family education;Passive range of motion  (Pended)     PT Next Visit Plan  check understanding of ex's given 6/10 and then add to HEP for hip strengthening, ankle DF, walking program, balance  (Pended)     PT Home Exercise Plan  standing hip abdct; seated hamstring curl with green band around ankles (prone & standing hurt his lower back)  (Pended)     Consulted and Agree with Plan of Care  Patient  (Pended)        Patient will benefit from skilled therapeutic intervention in order to improve the following deficits and impairments:  (P) Abnormal gait, Decreased activity tolerance, Decreased balance, Decreased mobility, Decreased knowledge of use of DME, Decreased endurance, Decreased strength, Impaired sensation, Impaired UE functional use, Impaired tone  Visit Diagnosis: Unsteadiness on feet  Other abnormalities of gait and mobility  Acute ischemic stroke (HCC) - R basal ganglia s/p IV tPA     Problem List Patient Active Problem List   Diagnosis Date Noted  . Hypotension due to drugs   . Labile blood pressure   . Benign prostatic hyperplasia   . Hemiparesis affecting left side as late effect of stroke ([place])   . Basal ganglia infarction ([place]) 06/07/2017  . 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 ([place])   . Gait disturbance, post-stroke   . Hyperlipidemia   . Acute ischemic stroke ([place]) - R basal ganglia s/p IV tPA 06/05/2017  . Prostate nodule 10/30/2016  . Essential hypertension 08/05/2016  . External hemorrhoids without complication 09/73/5329  . Melanosis of colon  11/23/2015  . Diverticulosis of colon without hemorrhage 11/23/2015  . Internal hemorrhoids 11/23/2015  . Essential hypertension, benign  09/14/2014    Rexanne Mano, PT 06/25/2017, 8:06 PM  [facility] [address] [place], [name], [address] Phone: [phone]   Fax:  [phone]  Name: Robert Sandoval MRN: [id number] Date of Birth: 09/04/1960

## 2017-06-25 NOTE — Patient Instructions (Signed)
Access Code: [facility]  URL: [email or web address]  Date: 06/25/2017  Prepared by: Barry Brunner   Exercises  Seated Knee Flexion with Anchored Resistance - 10 reps - 3 sets - 3 hold - 1x daily - 7x weekly  Standing Hip Abduction - 20 reps - 2 sets - 3 seconds hold - 1x daily - 7x weekly

## 2017-06-29 ENCOUNTER — Ambulatory Visit: Payer: Managed Care, Other (non HMO) | Admitting: Occupational Therapy

## 2017-06-29 DIAGNOSIS — R278 Other lack of coordination: Secondary | ICD-10-CM

## 2017-06-29 DIAGNOSIS — M6281 Muscle weakness (generalized): Secondary | ICD-10-CM

## 2017-06-29 NOTE — Patient Instructions (Signed)
  Coordination Activities  Perform the following activities for 20 minutes 1 times per day with left hand(s).   Rotate ball in fingertips (clockwise and counter-clockwise).  Toss ball between hands.  Flip cards 1 at a time as fast as you can.  Deal cards with your thumb (Hold deck in hand and push card off top with thumb).  Pick up coins and stack.  Pick up coins one at a time until you get 5-10 in your hand, then move coins from palm to fingertips to stack one at a time.   1. Grip Strengthening (Resistive Putty)   Squeeze putty using thumb and all fingers. Repeat _20___ times. Do __2__ sessions per day.   2. Roll putty into tube on table and pinch between each finger and thumb x 10 reps each. (can do ring and small finger together)     Copyright  V[employer]. All rights reserved.

## 2017-06-29 NOTE — Therapy (Signed)
[facility] [address] [place], [name], [address] Phone: [phone]   Fax:  [phone]  Occupational Therapy Treatment  Patient Details  Name: Robert Sandoval MRN: [id number] Date of Birth: 07-May-1960 Referring Provider: Delice Lesch MD   Encounter Date: 06/29/2017  OT End of Session - 06/29/17 1202    Visit Number  2    Number of Visits  17    Date for OT Re-Evaluation  08/20/17    Authorization Type  [employer] covered 100% 60 visit limit for each OT/PT    OT Start Time  1152    OT Stop Time  1230    OT Time Calculation (min)  38 min    Activity Tolerance  Patient tolerated treatment well    Behavior During Therapy  [facility] for tasks assessed/performed       Past Medical History:  Diagnosis Date  . Allergy   . BPH (benign prostatic hyperplasia)    followed by [facility]  . Hyperlipidemia   . Hypertension   . Low back pain radiating to left lower extremity    sees Dr. Tonita Cong    No past surgical history on file.  There were no vitals filed for this visit.  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  No/denies             Treatment: Gripper set at level 2 to pick up 1 inch blocks for sustained grip, min difficulty(pt attempted level 3 however it was too challenging.) Copying small peg design for increased fine motor coordination, good accuracy only min difficulty, removing with in hand manipulation. Digi-flex red for sustained composite grip then individual finger flexion, min v.c 10-20 reps each.              OT Education - 06/29/17 1213    Education Details  initial coordination HEP, red putty HEP , pt returned   demonstration    Person(s) Educated  Patient    Methods  Explanation    Comprehension  Verbalized understanding       OT Short Term Goals - 06/21/17 1222      OT SHORT TERM GOAL #1   Title  [name] be independent with HEP for coordination.--check STGs 07/21/17    Time  4    Period  Weeks    Status  New      OT SHORT TERM GOAL #2   Title  [name] improve coordination for ADLs as shown by completing 9-hole peg test in less than 26sec.    Baseline  35.28sec    Time  4    Period  Weeks    Status  New      OT SHORT TERM GOAL #3   Title  [name] improve L grip strength by at least 6lbs for IADLs.      Baseline  57lbs    Time  4    Period  Weeks    Status  New      OT SHORT TERM GOAL #4   Title  [name] perform dynamic standing activities/IADL tasks for at least 48min without LOB or rest break.    Time  4    Period  Weeks        OT Long Term Goals - 06/21/17 1227      OT LONG TERM GOAL #1   Title  [name] be independent with strengthening HEP.--check LTGs 08/21/17    Time  8    Period  Weeks    Status  New      OT LONG TERM GOAL #2   Title  [name] perform targeted overhead reaching for at least 31min without rest breaks and only 2 drops or less.      Time  8    Period  Weeks    Status  New      OT LONG TERM GOAL #3   Title  [name] improve L grip strength by at least 12lbs for IADLs.      Baseline  57lbs    Time  8    Period  Weeks    Status  New      OT LONG TERM GOAL #4   Title  [name] perform dynamic standing activities/IADL tasks for at least 45min without LOB or rest break (including lifting/carrying/reaching tasks).    Time  8    Period  Weeks    Status  New      OT LONG TERM GOAL #5   Title  [name] perform all previous home maintenance tasks mod I.    Time  8    Period  Weeks    Status  New            Plan - 06/29/17 1217    Clinical Impression Statement  Pt is progressing towards goals. He demonstrates understanding of inital HEP.     Occupational performance deficits (Please refer to evaluation for details):  ADL's;IADL's;Leisure    Rehab Potential  Good    OT Frequency  2x / week    OT Duration  8 weeks    OT Treatment/Interventions  Self-care/ADL training;Therapeutic exercise;Patient/family education;Neuromuscular education;Moist Heat;Fluidtherapy;Energy conservation;Therapist, nutritional;Therapeutic activities;Balance training;Manual Therapy;Passive range of motion;DME and/or AE instruction;Ultrasound;Cryotherapy    Plan  check to see how HEP is going, address LUE functional use, work towards goals.    Consulted and Agree with Plan of Care  Patient       Patient Robert benefit from skilled therapeutic intervention in order to improve the following deficits and impairments:  Decreased coordination, Decreased mobility, Impaired sensation, Decreased strength, Decreased endurance, Decreased activity tolerance, Decreased balance, Impaired UE functional use  Visit Diagnosis: Other lack of coordination  Muscle weakness (generalized)    Problem List Patient Active Problem List   Diagnosis Date Noted  . Hypotension due to drugs   . Labile blood pressure   . Benign prostatic hyperplasia   . Hemiparesis affecting left side as late effect of stroke ([place])   . Basal ganglia infarction ([place]) 06/07/2017  . 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 ([name])   . Gait disturbance, post-stroke   . Hyperlipidemia   . Acute ischemic stroke ([place]) - R basal ganglia s/p IV tPA 06/05/2017  . Prostate nodule 10/30/2016  . Essential hypertension 08/05/2016  . External hemorrhoids without complication 75/10/2583  . Melanosis of colon 11/23/2015  . Diverticulosis of colon without hemorrhage 11/23/2015  . Internal hemorrhoids 11/23/2015  . Essential hypertension, benign 09/14/2014    RINE,KATHRYN 06/29/2017, 12:26 PM  [facility] [address] [place], [name], [address] Phone: [phone]  Fax:  [phone]  Name: Robert Sandoval MRN: [id number] Date of Birth: 05-01-1960

## 2017-07-02 ENCOUNTER — Ambulatory Visit: Payer: Managed Care, Other (non HMO) | Admitting: Occupational Therapy

## 2017-07-03 ENCOUNTER — Ambulatory Visit: Payer: Managed Care, Other (non HMO) | Admitting: Physical Therapy

## 2017-07-03 ENCOUNTER — Encounter: Payer: Self-pay | Admitting: Occupational Therapy

## 2017-07-03 ENCOUNTER — Encounter: Payer: Self-pay | Admitting: Physical Therapy

## 2017-07-03 ENCOUNTER — Ambulatory Visit: Payer: Managed Care, Other (non HMO) | Admitting: Occupational Therapy

## 2017-07-03 DIAGNOSIS — R278 Other lack of coordination: Secondary | ICD-10-CM | POA: Diagnosis not present

## 2017-07-03 DIAGNOSIS — I639 Cerebral infarction, unspecified: Secondary | ICD-10-CM

## 2017-07-03 DIAGNOSIS — R2689 Other abnormalities of gait and mobility: Secondary | ICD-10-CM

## 2017-07-03 DIAGNOSIS — R2681 Unsteadiness on feet: Secondary | ICD-10-CM

## 2017-07-03 DIAGNOSIS — R270 Ataxia, unspecified: Secondary | ICD-10-CM

## 2017-07-03 DIAGNOSIS — M6281 Muscle weakness (generalized): Secondary | ICD-10-CM

## 2017-07-03 NOTE — Therapy (Signed)
[facility] [address] [place], [name], [address] Phone: [phone]   Fax:  [phone]  Physical Therapy Treatment  Patient Details  Name: Robert Sandoval MRN: [id number] Date of Birth: [birth year] Referring Provider: Delice Lesch MD   Encounter Date: 07/03/2017   07/03/17 0852  PT Visits / Re-Eval  Visit Number 2  Number of Visits 17  Date for PT Re-Evaluation 08/24/17  Authorization  Authorization - Visit Number 2  Authorization - Number of Visits 60  PT Time Calculation  PT Start Time 0848  PT Stop Time 0930  PT Time Calculation (min) 42 min  PT - End of Session  Equipment Utilized During Treatment Gait belt  Activity Tolerance Patient tolerated treatment well  Behavior During Therapy [facility] for tasks assessed/performed     Past Medical History:  Diagnosis Date  . Allergy   . BPH (benign prostatic hyperplasia)    followed by [facility]  . Hyperlipidemia   . Hypertension   . Low back pain radiating to left lower extremity    sees Dr. Tonita Cong    History reviewed. No pertinent surgical history.  There were no vitals filed for this visit.  Subjective Assessment - 07/03/17 0850    Subjective  No new complaints. No falls. Does endorse sciatica pain    Pertinent History  R basal ganglia CVA 06/05/17; HTN, hyperlipidemia low back pain with left leg extremity radiculopathy     Currently in Pain?  Yes    Pain Score  4     Pain Location  Back    Pain Orientation  Lower    Pain Descriptors / Indicators  Aching;Discomfort;Dull;Numbness;Tingling    Pain Type  Chronic pain;Neuropathic pain    Pain Radiating Towards  into left LE    Pain Onset  More than a month ago    Pain Frequency  Constant    Aggravating Factors   sitting    Pain Relieving Factors  stretching         Balance Exercises - 07/03/17 0921      Balance Exercises: Standing   Rockerboard  Anterior/posterior;Lateral;EC;EO;30 seconds       Balance Exercises: Standing   Rebounder Limitations  performed both ways on balance board: rocking board with emphasis on tall posture with EO, progressing to EC; holding board steady with EC no head movements. no UE support, min guard to min assist for balance wiht cues on posture/ex form.       Treatment continued: Initiated session with review of ex's issued at previous session with cues needed on form, technique and hold times.    PT Education - 07/03/17 1023    Education Details  added to HEP    Person(s) Educated  Patient    Methods  Explanation;Demonstration;Verbal cues;Handout;Tactile cues    Comprehension  Verbalized understanding;Returned demonstration;Verbal cues required;Need further instruction      Access Code: [facility]  URL: [email or web address]  Date: 07/03/2017  Prepared by: Willow Ora   Exercises  Seated Knee Flexion with Anchored Resistance - 10 reps - 3 sets - 3 hold - 1x daily - 7x weekly  Standing Hip Abduction - 20 reps - 2 sets - 3 seconds hold - 1x daily - 7x weekly  Heel Toe Raises with Counter Support - 10 reps - 2 sets - 1x daily - 7x weekly  Walking March - 3 reps - 1 sets - 1x daily - 7x weekly  Narrow Stance with Eyes Closed and Head Rotation on  Foam Pad - 3 reps - 1 sets - 30 hold - 1x daily - 7x weekly  Wide Stance with Eyes Closed and Head Rotation on Foam Pad - 10 reps - 1 sets - 1x daily - 7x weekly    PT Short Term Goals - 06/25/17 1956      PT SHORT TERM GOAL #1   Title  Patient will be independent with HEP (Target for all STGs 07/23/2017)    Time  4    Period  Weeks    Status  New    Target Date  07/23/17      PT SHORT TERM GOAL #2   Title  Patient will improve 5x sit to stand to <=9.0 seconds to demonstrate improved LE strength.    Time  4    Period  Weeks    Status  New      PT SHORT TERM GOAL #3   Title  Patient will improve FGA to >=23/30 to demonstrate decreasing fall risk.     Time  4    Period  Weeks     Status  New      PT SHORT TERM GOAL #4   Title  Patient will ambulate up to 500 ft over unlevel outdoor surfaces, including up/down curbs with no assistive device and supervision.    Time  4    Period  Weeks    Status  New        PT Long Term Goals - 06/25/17 2000      PT LONG TERM GOAL #1   Title  Patient will be independent with updated HEP and able to state plan for continued activity upon discharge from PT (Target for all LTGs 08/20/2017)    Time  8    Period  Weeks    Status  New    Target Date  08/20/17      PT LONG TERM GOAL #2   Title  Patient will improve FGA >=27/30 demonstrating a measurable improvement in his balance and gait.    Time  8    Period  Weeks    Status  New      PT LONG TERM GOAL #3   Title  Patient will ambulate >=1069ft over level and unlevel outdoor terrain modified independent.     Time  8    Period  Weeks    Status  New            Plan - 07/03/17 5462    Clinical Impression Statement  Today's skilled session focused on adding balance components to pt's current HEP in [place] after review of ex's issued at last session. No issues reported with performance in session today. Pt is progressing toward goals and should benefit from continued PT to progress toward unmet goals.     Rehab Potential  Good    PT Frequency  2x / week    PT Duration  8 weeks    PT Treatment/Interventions  ADLs/Self Care Home Management;Electrical Stimulation;Gait training;DME Instruction;Stair training;Functional mobility training;Therapeutic activities;Therapeutic exercise;Balance training;Neuromuscular re-education;Manual techniques;Orthotic Fit/Training;Patient/family education;Passive range of motion    PT Next Visit Plan  continue to address left LE strengthening/stability with gait    PT Home Exercise Plan  standing hip abdct; seated hamstring curl with green band around ankles (prone & standing hurt his lower back)    Consulted and Agree with Plan of Care  Patient        Patient will benefit from skilled therapeutic intervention  in order to improve the following deficits and impairments:  Abnormal gait, Decreased activity tolerance, Decreased balance, Decreased mobility, Decreased knowledge of use of DME, Decreased endurance, Decreased strength, Impaired sensation, Impaired UE functional use, Impaired tone  Visit Diagnosis: Unsteadiness on feet  Other abnormalities of gait and mobility  Acute ischemic stroke (HCC) - R basal ganglia s/p IV tPA     Problem List Patient Active Problem List   Diagnosis Date Noted  . Hypotension due to drugs   . Labile blood pressure   . Benign prostatic hyperplasia   . Hemiparesis affecting left side as late effect of stroke ([place])   . Basal ganglia infarction ([place]) 06/07/2017  . 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 ([place])   . Gait disturbance, post-stroke   . Hyperlipidemia   . Acute ischemic stroke ([place]) - R basal ganglia s/p IV tPA 06/05/2017  . Prostate nodule 10/30/2016  . Essential hypertension 08/05/2016  . External hemorrhoids without complication 44/62/8638  . Melanosis of colon 11/23/2015  . Diverticulosis of colon without hemorrhage 11/23/2015  . Internal hemorrhoids 11/23/2015  . Essential hypertension, benign 09/14/2014    Willow Ora, PTA, [place] [address], [place],  [address] [phone] 07/03/17, 10:26 AM    Name: Robert Sandoval MRN: [id number] Date of Birth: 01-Dec-1960

## 2017-07-03 NOTE — Therapy (Signed)
[facility] [address] [place], [name], [address] Phone: [phone]   Fax:  [phone]  Occupational Therapy Treatment  Patient Details  Name: Augusto Deckman MRN: [id number] Date of Birth: 1960/06/05 Referring Provider: Delice Lesch MD   Encounter Date: 07/03/2017  OT End of Session - 07/03/17 0941    Visit Number  3    Number of Visits  17    Date for OT Re-Evaluation  08/20/17    Authorization Type  [employer] covered 100% 60 visit limit for each OT/PT    OT Start Time  0935    OT Stop Time  1015    OT Time Calculation (min)  40 min    Activity Tolerance  Patient tolerated treatment well    Behavior During Therapy  [facility] for tasks assessed/performed       Past Medical History:  Diagnosis Date  . Allergy   . BPH (benign prostatic hyperplasia)    followed by [facility]  . Hyperlipidemia   . Hypertension   . Low back pain radiating to left lower extremity    sees Dr. Tonita Cong    History reviewed. No pertinent surgical history.  There were no vitals filed for this visit.  Subjective Assessment - 07/03/17 0936    Subjective   doing ok with HEP    Pertinent History  R basal ganglia CVA 06/05/17; HTN, hyperlipidemia low back pain with left leg extremity radiculopathy    Patient Stated Goals  walk better, incr control in LUE, get back to work, improve endurance for IADLs    Currently in Pain?  No/denies         Copying small peg design with min decr in accuracy/min v.c. To correct and min difficulty with coordination.    In standing, functional reaching to place clothespins with 1-8lb resistance on vertical pole with wt. Shift/trunk rotation and dynamic balance.  Ambulating with plate and cup of water to simulate carrying food with no spills, but incr in time and min difficulty.  Rotating relaxation balls in L hand both directions with min difficulty/drops for incr coordination.  Completing [place]  pegboard with min difficulty/drops for incr coordination.  Scapular exercises in prone with shoulders positioned in shoulder extension and abduction x15 each.                    OT Short Term Goals - 06/21/17 1222      OT SHORT TERM GOAL #1   Title  Pt will be independent with HEP for coordination.--check STGs 07/21/17    Time  4    Period  Weeks    Status  New      OT SHORT TERM GOAL #2   Title  Pt will improve coordination for ADLs as shown by completing 9-hole peg test in less than 26sec.    Baseline  35.28sec    Time  4    Period  Weeks    Status  New      OT SHORT TERM GOAL #3   Title  Pt will improve L grip strength by at least 6lbs for IADLs.      Baseline  57lbs    Time  4    Period  Weeks    Status  New      OT SHORT TERM GOAL #4   Title  Pt will perform dynamic standing activities/IADL tasks for at least 5min without LOB or rest break.    Time  4    Period  Weeks        OT Long Term Goals - 06/21/17 1227      OT LONG TERM GOAL #1   Title  Pt will be independent with strengthening HEP.--check LTGs 08/21/17    Time  8    Period  Weeks    Status  New      OT LONG TERM GOAL #2   Title  Pt will perform targeted overhead reaching for at least 31min without rest breaks and only 2 drops or less.      Time  8    Period  Weeks    Status  New      OT LONG TERM GOAL #3   Title  Pt will improve L grip strength by at least 12lbs for IADLs.      Baseline  57lbs    Time  8    Period  Weeks    Status  New      OT LONG TERM GOAL #4   Title  Pt will perform dynamic standing activities/IADL tasks for at least 71min without LOB or rest break (including lifting/carrying/reaching tasks).    Time  8    Period  Weeks    Status  New      OT LONG TERM GOAL #5   Title  Pt will perform all previous home maintenance tasks mod I.    Time  8    Period  Weeks    Status  New            Plan - 07/03/17 0941    Clinical Impression Statement  Pt is  progressing towards goals with improving coordination.      Occupational performance deficits (Please refer to evaluation for details):  ADL's;IADL's;Leisure    Rehab Potential  Good    OT Frequency  2x / week    OT Duration  8 weeks    OT Treatment/Interventions  Self-care/ADL training;Therapeutic exercise;Patient/family education;Neuromuscular education;Moist Heat;Fluidtherapy;Energy conservation;Therapist, nutritional;Therapeutic activities;Balance training;Manual Therapy;Passive range of motion;DME and/or AE instruction;Ultrasound;Cryotherapy    Plan  environmental scanning with physical task, continue with strength/coordination and activity tolerance, scapular strengthening/wt bearing for ataxia (with HEP)    Consulted and Agree with Plan of Care  Patient       Patient will benefit from skilled therapeutic intervention in order to improve the following deficits and impairments:  Decreased coordination, Decreased mobility, Impaired sensation, Decreased strength, Decreased endurance, Decreased activity tolerance, Decreased balance, Impaired UE functional use  Visit Diagnosis: Other lack of coordination  Muscle weakness (generalized)  Unsteadiness on feet  Other abnormalities of gait and mobility  Ataxia    Problem List Patient Active Problem List   Diagnosis Date Noted  . Hypotension due to drugs   . Labile blood pressure   . Benign prostatic hyperplasia   . Hemiparesis affecting left side as late effect of stroke ([place])   . Basal ganglia infarction ([place]) 06/07/2017  . 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 ([place])   . Gait disturbance, post-stroke   . Hyperlipidemia   . Acute ischemic stroke ([place]) - R basal ganglia s/p IV tPA 06/05/2017  . Prostate nodule 10/30/2016  . Essential hypertension 08/05/2016  . External hemorrhoids without complication 81/19/1478  . Melanosis of colon 11/23/2015  . Diverticulosis of colon without  hemorrhage 11/23/2015  . Internal hemorrhoids 11/23/2015  . Essential hypertension, benign 09/14/2014    [facility] 07/03/2017, 10:06 AM  [facility] [place] [address] [place], [name], [address] Phone: [phone]   Fax:  [phone]  Name: Tahje Borawski MRN: [id number] Date of Birth: Apr 18, 1960   Vianne Bulls, OTR/L [facility] [address] [place], [place]  [address] [phone] phone [phone] 07/03/17 10:06 AM

## 2017-07-06 ENCOUNTER — Encounter: Payer: Self-pay | Admitting: Physical Therapy

## 2017-07-06 ENCOUNTER — Ambulatory Visit: Payer: Managed Care, Other (non HMO) | Admitting: Physical Therapy

## 2017-07-06 DIAGNOSIS — R278 Other lack of coordination: Secondary | ICD-10-CM | POA: Diagnosis not present

## 2017-07-06 DIAGNOSIS — I639 Cerebral infarction, unspecified: Secondary | ICD-10-CM

## 2017-07-06 DIAGNOSIS — R2681 Unsteadiness on feet: Secondary | ICD-10-CM

## 2017-07-06 DIAGNOSIS — R2689 Other abnormalities of gait and mobility: Secondary | ICD-10-CM

## 2017-07-07 NOTE — Therapy (Signed)
[facility] [address] [place], [name], [address] Phone: [phone]   Fax:  [phone]  Physical Therapy Treatment  Patient Details  Name: Robert Sandoval MRN: [id number] Date of Birth: Apr 16, 1960 Referring Provider: Delice Lesch MD   Encounter Date: 07/06/2017  PT End of Session - 07/06/17 1107    Visit Number  3    Number of Visits  17    Date for PT Re-Evaluation  08/24/17    Authorization - Visit Number  3    Authorization - Number of Visits  57    PT Start Time  8938    PT Stop Time  1145    PT Time Calculation (min)  40 min    Equipment Utilized During Treatment  Gait belt    Activity Tolerance  Patient tolerated treatment well    Behavior During Therapy  [facility] for tasks assessed/performed       Past Medical History:  Diagnosis Date  . Allergy   . BPH (benign prostatic hyperplasia)    followed by [facility]  . Hyperlipidemia   . Hypertension   . Low back pain radiating to left lower extremity    sees Dr. Tonita Cong    History reviewed. No pertinent surgical history.  There were no vitals filed for this visit.  Subjective Assessment - 07/06/17 1107    Subjective  No new complaitns. No falls or pain to report.     Pertinent History  R basal ganglia CVA 06/05/17; HTN, hyperlipidemia low back pain with left leg extremity radiculopathy     Patient Stated Goals  improve my leg strength and my walking    Currently in Pain?  No/denies    Pain Score  0-No pain          [facility] Adult PT Treatment/Exercise - 07/06/17 1108      Exercises   Exercises  Other Exercises    Other Exercises   for LE strengthening: forward stool scoots for 115 feet alternating feet for single foot pull; steated on stool- isolated left LE pulling fwd/pushing bwd x 10 reps (right leg propped on stool to prevent use);       Knee/Hip Exercises: Machines for Strengthening   Cybex Leg Press  bil LE's 70# 5 sec holds x 10 reps; left  LE only 40# 5 sec holds x 10 reps          Balance Exercises - 07/06/17 1133      Balance Exercises: Standing   Standing Eyes Closed  Narrow base of support (BOS);Foam/compliant surface;Other reps (comment);Limitations;30 secs    SLS with Vectors  Foam/compliant surface;5 reps;Limitations    Tandem Gait  Forward;Retro;Intermittent upper extremity support;3 reps;Limitations    Sidestepping  Foam/compliant support;3 reps;Limitations    Sit to Stand Time  x 10 reps with feet across blue foam beam, x 10 reps on balance board in lateral direction, x 10 reps on balance board in ant/post direction. min guard to min assist for balance with cues for full standing and slow, controlled descent.                       Balance Exercises: Standing   Standing Eyes Closed Limitations  standing across blue foam beam with feet together: EC no head movements, progressing to EC head movements left<>right, up<>down and diagonals both ways. min guard assist to min assist for balance.     SLS with Vectors Limitations  standing across blue foam  beam with 2 tall cones in front: alternating fwd toe taps     Tandem Gait Limitations  on blue foam beam: 3 laps each fwd/bwd with cues on posture and form, light UE support on bars for balance.     Sidestepping Limitations  on blue foam beam: 3 laps each way with light touch at times on bars for balance. cues on posture and weight shifitng with activity.           PT Short Term Goals - 06/25/17 1956      PT SHORT TERM GOAL #1   Title  Patient will be independent with HEP ([place] for all STGs 07/23/2017)    Time  4    Period  Weeks    Status  New    Target Date  07/23/17      PT SHORT TERM GOAL #2   Title  Patient will improve 5x sit to stand to <=9.0 seconds to demonstrate improved LE strength.    Time  4    Period  Weeks    Status  New      PT SHORT TERM GOAL #3   Title  Patient will improve FGA to >=23/30 to demonstrate decreasing fall risk.     Time  4     Period  Weeks    Status  New      PT SHORT TERM GOAL #4   Title  Patient will ambulate up to 500 ft over unlevel outdoor surfaces, including up/down curbs with no assistive device and supervision.    Time  4    Period  Weeks    Status  New        PT Long Term Goals - 06/25/17 2000      PT LONG TERM GOAL #1   Title  Patient will be independent with updated HEP and able to state plan for continued activity upon discharge from PT (Target for all LTGs 08/20/2017)    Time  8    Period  Weeks    Status  New    Target Date  08/20/17      PT LONG TERM GOAL #2   Title  Patient will improve FGA >=27/30 demonstrating a measurable improvement in his balance and gait.    Time  8    Period  Weeks    Status  New      PT LONG TERM GOAL #3   Title  Patient will ambulate >=107ft over level and unlevel outdoor terrain modified independent.     Time  8    Period  Weeks    Status  New          Plan - 07/06/17 1108    Clinical Impression Statement  Today's skilled session continued to focus on LE strengthening and balance with only LE fatigue reported. Short rest breaks taken to address this. Pt is progressing toward goals and should benefit from continued PT to progress toward unmet goals.     Rehab Potential  Good    PT Frequency  2x / week    PT Duration  8 weeks    PT Treatment/Interventions  ADLs/Self Care Home Management;Electrical Stimulation;Gait training;DME Instruction;Stair training;Functional mobility training;Therapeutic activities;Therapeutic exercise;Balance training;Neuromuscular re-education;Manual techniques;Orthotic Fit/Training;Patient/family education;Passive range of motion    PT Next Visit Plan  continue to address left LE strengthening/stability with gait    PT Home Exercise Plan  standing hip abdct; seated hamstring curl with green band around ankles (prone & standing hurt  his lower back)    Consulted and Agree with Plan of Care  Patient       Patient will  benefit from skilled therapeutic intervention in order to improve the following deficits and impairments:  Abnormal gait, Decreased activity tolerance, Decreased balance, Decreased mobility, Decreased knowledge of use of DME, Decreased endurance, Decreased strength, Impaired sensation, Impaired UE functional use, Impaired tone  Visit Diagnosis: Unsteadiness on feet  Other abnormalities of gait and mobility  Acute ischemic stroke (HCC) - R basal ganglia s/p IV tPA     Problem List Patient Active Problem List   Diagnosis Date Noted  . Hypotension due to drugs   . Labile blood pressure   . Benign prostatic hyperplasia   . Hemiparesis affecting left side as late effect of stroke ([place])   . Basal ganglia infarction ([place]) 06/07/2017  . 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 ([place])   . Gait disturbance, post-stroke   . Hyperlipidemia   . Acute ischemic stroke ([place]) - R basal ganglia s/p IV tPA 06/05/2017  . Prostate nodule 10/30/2016  . Essential hypertension 08/05/2016  . External hemorrhoids without complication 89/21/1941  . Melanosis of colon 11/23/2015  . Diverticulosis of colon without hemorrhage 11/23/2015  . Internal hemorrhoids 11/23/2015  . Essential hypertension, benign 09/14/2014    Willow Ora, PTA, [place] [address] [place], [place] [address] [phone] 07/07/17, 1:16 PM   Name: Robert Sandoval MRN: [id number] Date of Birth: 1960/02/13

## 2017-07-09 ENCOUNTER — Ambulatory Visit: Payer: Managed Care, Other (non HMO) | Admitting: Occupational Therapy

## 2017-07-09 ENCOUNTER — Ambulatory Visit: Payer: Managed Care, Other (non HMO) | Admitting: Physical Therapy

## 2017-07-09 ENCOUNTER — Encounter: Payer: Self-pay | Admitting: Physical Therapy

## 2017-07-09 ENCOUNTER — Encounter: Payer: Self-pay | Admitting: Occupational Therapy

## 2017-07-09 DIAGNOSIS — R278 Other lack of coordination: Secondary | ICD-10-CM | POA: Diagnosis not present

## 2017-07-09 DIAGNOSIS — R2681 Unsteadiness on feet: Secondary | ICD-10-CM

## 2017-07-09 DIAGNOSIS — M6281 Muscle weakness (generalized): Secondary | ICD-10-CM

## 2017-07-09 DIAGNOSIS — R2689 Other abnormalities of gait and mobility: Secondary | ICD-10-CM

## 2017-07-09 DIAGNOSIS — R270 Ataxia, unspecified: Secondary | ICD-10-CM

## 2017-07-09 NOTE — Patient Instructions (Signed)
Upper Extremity Extension (All-Fours)   Tighten stomach and raise left arm parallel to floor. Keep trunk rigid. Repeat 10 times per set.  Do 1 sessions per day.   Scapular Retraction (Prone)   Lie with arms at sides. Pinch shoulder blades together and raise arms a few inches from floor.  (thumbs down) Repeat 10 times per set. Do 1 sessions per day.  Scapular Retraction: Abduction / Extension (Prone)   Lie with arms out from sides 90. Pinch shoulder blades together and raise arms a few inches from floor. Palms down.  Repeat 15 times per set. Do 1 sessions per day.  Prone Plank (Eccentric)    On toes and elbows, pull abdomen in while stabilizing trunk. Slowly lower downward without arching back. 10 reps per set, 5 sets per day.   1 time/day.

## 2017-07-09 NOTE — Therapy (Signed)
[facility] [address] [place], [name], [address] Phone: [phone]   Fax:  [phone]  Occupational Therapy Treatment  Patient Details  Name: Robert Sandoval MRN: [id number] Date of Birth: 1960/12/09 Referring Provider: Delice Lesch MD   Encounter Date: 07/09/2017  OT End of Session - 07/09/17 1151    Visit Number  4    Number of Visits  17    Date for OT Re-Evaluation  08/20/17    Authorization Type  [employer] covered 100% 60 visit limit for each OT/PT    OT Start Time  1152    OT Stop Time  1230    OT Time Calculation (min)  38 min    Activity Tolerance  Patient tolerated treatment well    Behavior During Therapy  [facility] for tasks assessed/performed       Past Medical History:  Diagnosis Date  . Allergy   . BPH (benign prostatic hyperplasia)    followed by [facility]  . Hyperlipidemia   . Hypertension   . Low back pain radiating to left lower extremity    sees Dr. Tonita Cong    History reviewed. No pertinent surgical history.  There were no vitals filed for this visit.  Subjective Assessment - 07/09/17 1151    Subjective   Pt reports things are going well.   Pt reports that he is able to cook/clean up for approx 45 min without rest break.    Pertinent History  R basal ganglia CVA 06/05/17; HTN, hyperlipidemia low back pain with left leg extremity radiculopathy    Patient Stated Goals  walk better, incr control in LUE, get back to work, improve endurance for IADLs    Currently in Pain?  No/denies       In sitting, functional reaching overhead to place small pegs in vertical pegboard to copy design with good accuracy and min difficulty for coordination/drops.  Ambulating while tossing ball between hands and performing environmental scanning.  Min difficulty with physical task and approx 73% accuracy with environmental scanning/divided attention with physical task.  Picking up blocks with gripper set on  level 2 (black [place]) for sustained grip strength with min difficulty.  Reviewed coordination HEP:  Flipping cards, tossing ball between hands, rotating ball in fingertips, dealing cards with thumb, stacking coins, manipulating coins in hand to place in coin bank.  Flipping card between fingers for coordination with min difficulty.        OT Education - 07/09/17 1205    Education Details  Scapular strengthening HEP for incr UE control and endurance.    Person(s) Educated  Patient    Methods  Explanation;Demonstration;Verbal cues;Handout    Comprehension  Returned demonstration;Verbalized understanding;Verbal cues required min v.c. for positioning   min v.c. for positioning      OT Short Term Goals - 07/09/17 1208      OT SHORT TERM GOAL #1   Title  Pt will be independent with HEP for coordination.--check STGs 07/21/17    Time  4    Period  Weeks    Status  Achieved      OT SHORT TERM GOAL #2   Title  Pt will improve coordination for ADLs as shown by completing 9-hole peg test in less than 26sec.    Baseline  35.28sec    Time  4    Period  Weeks    Status  New      OT SHORT TERM GOAL #3   Title  Pt will improve L grip strength by at least 6lbs for IADLs.      Baseline  57lbs    Time  4    Period  Weeks    Status  New      OT SHORT TERM GOAL #4   Title  Pt will perform dynamic standing activities/IADL tasks for at least 74min without LOB or rest break.    Time  4    Period  Weeks    Status  Achieved 07/09/17 for pt report        OT Long Term Goals - 06/21/17 1227      OT LONG TERM GOAL #1   Title  Pt will be independent with strengthening HEP.--check LTGs 08/21/17    Time  8    Period  Weeks    Status  New      OT LONG TERM GOAL #2   Title  Pt will perform targeted overhead reaching for at least 24min without rest breaks and only 2 drops or less.      Time  8    Period  Weeks    Status  New      OT LONG TERM GOAL #3   Title  Pt will improve L grip strength  by at least 12lbs for IADLs.      Baseline  57lbs    Time  8    Period  Weeks    Status  New      OT LONG TERM GOAL #4   Title  Pt will perform dynamic standing activities/IADL tasks for at least 49min without LOB or rest break (including lifting/carrying/reaching tasks).    Time  8    Period  Weeks    Status  New      OT LONG TERM GOAL #5   Title  Pt will perform all previous home maintenance tasks mod I.    Time  8    Period  Weeks    Status  New            Plan - 07/09/17 1206    Clinical Impression Statement  Pt is progressing with activity tolerance and coordination.  Pt reports needing less breaks at home for IADL tasks.    Occupational performance deficits (Please refer to evaluation for details):  ADL's;IADL's;Leisure    Rehab Potential  Good    OT Frequency  2x / week    OT Duration  8 weeks    OT Treatment/Interventions  Self-care/ADL training;Therapeutic exercise;Patient/family education;Neuromuscular education;Moist Heat;Fluidtherapy;Energy conservation;Therapist, nutritional;Therapeutic activities;Balance training;Manual Therapy;Passive range of motion;DME and/or AE instruction;Ultrasound;Cryotherapy    Plan  environmental scanning with physical task, continue with strength/coordination and activity tolerance    OT Home Exercise Plan  Education provided:  scapula strengthening HEP, coordination HEP, red putty HEP    Consulted and Agree with Plan of Care  Patient       Patient will benefit from skilled therapeutic intervention in order to improve the following deficits and impairments:  Decreased coordination, Decreased mobility, Impaired sensation, Decreased strength, Decreased endurance, Decreased activity tolerance, Decreased balance, Impaired UE functional use  Visit Diagnosis: Other lack of coordination  Muscle weakness (generalized)  Other abnormalities of gait and mobility  Unsteadiness on feet  Ataxia    Problem List Patient Active  Problem List   Diagnosis Date Noted  . Hypotension due to drugs   . Labile blood pressure   . Benign prostatic hyperplasia   . Hemiparesis affecting left side  as late effect of stroke ([place])   . Basal ganglia infarction ([place]) 06/07/2017  . 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 ([place])   . Gait disturbance, post-stroke   . Hyperlipidemia   . Acute ischemic stroke ([place]) - R basal ganglia s/p IV tPA 06/05/2017  . Prostate nodule 10/30/2016  . Essential hypertension 08/05/2016  . External hemorrhoids without complication 57/90/3833  . Melanosis of colon 11/23/2015  . Diverticulosis of colon without hemorrhage 11/23/2015  . Internal hemorrhoids 11/23/2015  . Essential hypertension, benign 09/14/2014    [facility] 07/09/2017, 12:45 PM  [facility] [address] [place], [name], [address] Phone: [phone]   Fax:  [phone]  Name: Robert Sandoval MRN: [id number] Date of Birth: May 23, 1960   Vianne Bulls, OTR/L [facility] [address] [place], [place]  [address] [phone] phone [phone] 07/09/17 12:45 PM

## 2017-07-09 NOTE — Therapy (Signed)
[facility] [address] [place], [name], [address] Phone: [phone]   Fax:  [phone]  Physical Therapy Treatment  Patient Details  Name: Robert Sandoval MRN: [id number] Date of Birth: 11/26/1960 Referring Provider: Delice Lesch MD   Encounter Date: 07/09/2017  PT End of Session - 07/09/17 2033    Visit Number  4    Number of Visits  17    Date for PT Re-Evaluation  08/24/17    Authorization - Visit Number  4    Authorization - Number of Visits  60    PT Start Time  3875    PT Stop Time  1145    PT Time Calculation (min)  43 min    Equipment Utilized During Treatment  --    Activity Tolerance  Patient tolerated treatment well    Behavior During Therapy  [facility] for tasks assessed/performed       Past Medical History:  Diagnosis Date  . Allergy   . BPH (benign prostatic hyperplasia)    followed by [facility]  . Hyperlipidemia   . Hypertension   . Low back pain radiating to left lower extremity    sees Dr. Tonita Cong    History reviewed. No pertinent surgical history.  There were no vitals filed for this visit.  Subjective Assessment - 07/09/17 1104    Subjective  No new complaitns. No falls or pain to report. The hardest exercises are the hand exercises with the clay.     Pertinent History  R basal ganglia CVA 06/05/17; HTN, hyperlipidemia low back pain with left leg extremity radiculopathy     Patient Stated Goals  improve my leg strength and my walking    Currently in Pain?  Yes    Pain Score  4     Pain Location  Back    Pain Orientation  Left    Pain Descriptors / Indicators  Aching    Pain Type  Chronic pain;Neuropathic pain    Pain Onset  More than a month ago    Pain Frequency  Intermittent    Aggravating Factors   sitting; walking    Pain Relieving Factors  stretching                       [facility] Adult PT Treatment/Exercise - 07/09/17 1110      Exercises   Exercises   Knee/Hip       Nustep L4 x 3.5 minutes for warm-up. Instructed in supine piriformis stretch (2 styles) x 2 reps each x 30 sec with pt reporting decreased pain. Passive LLE sciatic nerve gliding performed with pt in supine. Pt was able to achieve hip flexion to 90 degrees with knee extension lacking ~10 degrees. Pt reported back pain down from 4 to 1 by end of session.   Gait training-indoors with head turns, vc for step length and improved left knee extension in stance phase (pt trying to prevent his knee from hyperextending); outdoors on level and unlevel surfaces (grass, gravel, mulch, curb, over concrete parking stop) x 500 ft with supervision and very slight imbalance with independent recovery. 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  Balance training on variety of compliant or mobile surfaces (rockerboard, BOSU) to elicit ankle vs hip vs step strategy to recover his balance. Patient required up to min assist to recover balance when LOB posteriorly.             PT Short  Term Goals - 06/25/17 1956      PT SHORT TERM GOAL #1   Title  Patient will be independent with HEP (Target for all STGs 07/23/2017)    Time  4    Period  Weeks    Status  New    Target Date  07/23/17      PT SHORT TERM GOAL #2   Title  Patient will improve 5x sit to stand to <=9.0 seconds to demonstrate improved LE strength.    Time  4    Period  Weeks    Status  New      PT SHORT TERM GOAL #3   Title  Patient will improve FGA to >=23/30 to demonstrate decreasing fall risk.     Time  4    Period  Weeks    Status  New      PT SHORT TERM GOAL #4   Title  Patient will ambulate up to 500 ft over unlevel outdoor surfaces, including up/down curbs with no assistive device and supervision.    Time  4    Period  Weeks    Status  New        PT Long Term Goals - 06/25/17 2000      PT LONG TERM GOAL #1   Title  Patient will be independent with updated HEP and able to state plan for  continued activity upon discharge from PT (Target for all LTGs 08/20/2017)    Time  8    Period  Weeks    Status  New    Target Date  08/20/17      PT LONG TERM GOAL #2   Title  Patient will improve FGA >=27/30 demonstrating a measurable improvement in his balance and gait.    Time  8    Period  Weeks    Status  New      PT LONG TERM GOAL #3   Title  Patient will ambulate >=1039ft over level and unlevel outdoor terrain modified independent.     Time  8    Period  Weeks    Status  New            Plan - 07/09/17 2034    Clinical Impression Statement  Skilled session focused on gait, balance, and strength training after initial warm-up to reduce pain and stiffness. On arrival, patient noted to walk with antalgic gait and reported his low back was more painful today. Denied changes in strength or sensation of his left side. Patient continues to make progress and is very motivated to meet his goals.     Clinical Decision Making  High    Rehab Potential  Good    PT Frequency  2x / week    PT Duration  8 weeks    PT Treatment/Interventions  ADLs/Self Care Home Management;Electrical Stimulation;Gait training;DME Instruction;Stair training;Functional mobility training;Therapeutic activities;Therapeutic exercise;Balance training;Neuromuscular re-education;Manual techniques;Orthotic Fit/Training;Patient/family education;Passive range of motion    PT Next Visit Plan  see if ?s re: low back stretches given 6/24; continue to address left LE strengthening/stability with gait/balance on compliant surfaces    PT Home Exercise Plan  standing hip abdct; seated hamstring curl with green band around ankles (prone & standing hurt his lower back)    Consulted and Agree with Plan of Care  Patient       Patient will benefit from skilled therapeutic intervention in order to improve the following deficits and impairments:  Abnormal gait, Decreased activity tolerance,  Decreased balance, Decreased mobility,  Decreased knowledge of use of DME, Decreased endurance, Decreased strength, Impaired sensation, Impaired UE functional use, Impaired tone  Visit Diagnosis: Other abnormalities of gait and mobility  Unsteadiness on feet     Problem List Patient Active Problem List   Diagnosis Date Noted  . Hypotension due to drugs   . Labile blood pressure   . Benign prostatic hyperplasia   . Hemiparesis affecting left side as late effect of stroke ([place])   . Basal ganglia infarction ([place]) 06/07/2017  . 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 ([place])   . Gait disturbance, post-stroke   . Hyperlipidemia   . Acute ischemic stroke ([place]) - R basal ganglia s/p IV tPA 06/05/2017  . Prostate nodule 10/30/2016  . Essential hypertension 08/05/2016  . External hemorrhoids without complication 41/63/8453  . Melanosis of colon 11/23/2015  . Diverticulosis of colon without hemorrhage 11/23/2015  . Internal hemorrhoids 11/23/2015  . Essential hypertension, benign 09/14/2014    Rexanne Mano, PT 07/09/2017, 8:40 PM  [facility] [address] [place], [name], [address] Phone: [phone]   Fax:  [phone]  Name: Robert Sandoval MRN: [id number] Date of Birth: 1960/07/12

## 2017-07-12 ENCOUNTER — Ambulatory Visit: Payer: Managed Care, Other (non HMO) | Admitting: Occupational Therapy

## 2017-07-12 ENCOUNTER — Encounter: Payer: Self-pay | Admitting: Occupational Therapy

## 2017-07-12 ENCOUNTER — Encounter: Payer: Self-pay | Admitting: Physical Therapy

## 2017-07-12 ENCOUNTER — Ambulatory Visit: Payer: Managed Care, Other (non HMO) | Admitting: Physical Therapy

## 2017-07-12 DIAGNOSIS — R2681 Unsteadiness on feet: Secondary | ICD-10-CM

## 2017-07-12 DIAGNOSIS — R278 Other lack of coordination: Secondary | ICD-10-CM

## 2017-07-12 DIAGNOSIS — M6281 Muscle weakness (generalized): Secondary | ICD-10-CM

## 2017-07-12 DIAGNOSIS — R270 Ataxia, unspecified: Secondary | ICD-10-CM

## 2017-07-12 DIAGNOSIS — R2689 Other abnormalities of gait and mobility: Secondary | ICD-10-CM

## 2017-07-12 NOTE — Addendum Note (Signed)
Addended by: Rexanne Mano on: 07/12/2017 08:56 PM   Modules accepted: Orders

## 2017-07-12 NOTE — Therapy (Signed)
[facility] [address] [place], [name], [address] Phone: [phone]   Fax:  [phone]  Physical Therapy Treatment  Patient Details  Name: Robert Sandoval MRN: [id number] Date of Birth: May 15, 1960 Referring Provider: Delice Lesch MD   Encounter Date: 07/12/2017  PT End of Session - 07/12/17 1016    Visit Number  5    Number of Visits  17    Date for PT Re-Evaluation  08/24/17    Authorization - Visit Number  4    Authorization - Number of Visits  60    PT Start Time  1016    PT Stop Time  1055    PT Time Calculation (min)  39 min    Activity Tolerance  Patient tolerated treatment well    Behavior During Therapy  [facility] for tasks assessed/performed       Past Medical History:  Diagnosis Date  . Allergy   . BPH (benign prostatic hyperplasia)    followed by [facility]  . Hyperlipidemia   . Hypertension   . Low back pain radiating to left lower extremity    sees Dr. Tonita Cong    History reviewed. No pertinent surgical history.  There were no vitals filed for this visit.  Subjective Assessment - 07/12/17 1018    Subjective  No new complaitns. No falls or pain to report.     Pertinent History  R basal ganglia CVA 06/05/17; HTN, hyperlipidemia low back pain with left leg extremity radiculopathy     Patient Stated Goals  improve my leg strength and my walking    Currently in Pain?  No/denies    Pain Onset  --           Treatment-  There-ex for strength and coordination-Elliptical with bil UE support x 3 total minutes with alternating forward and backward pedaling every 45-60 seconds; supine LLE coordination exercises (left heel to lightly touch ______ (rt knee, rt great toe, lateral to rt knee, etc); prone knee flexion with manual resistance and controlled eccentric lowering x 15. Standing semi-squat on LLE while holding RLE off the floor with practice on knee control as extend left leg to tall standing  (single light UE support for balance). Repeated semi-squat with green then blue band behind knee (facing anchor). 5 sets total 10-20 reps each.   Gait training with focus on left knee control. Pt has developed habit of keeping left knee in slight flexion during stance to heel off to prevent recurvatum. Educated to allow knee to extend and provided distraction of tossing a ball hand to hand and noted mild recurvatum ~10% of steps. Repeated activity on unlevel grass with recurvatum ~30% of the time, however still of mild force.                       PT Short Term Goals - 06/25/17 1956      PT SHORT TERM GOAL #1   Title  Patient will be independent with HEP (Target for all STGs 07/23/2017)    Time  4    Period  Weeks    Status  New    Target Date  07/23/17      PT SHORT TERM GOAL #2   Title  Patient will improve 5x sit to stand to <=9.0 seconds to demonstrate improved LE strength.    Time  4    Period  Weeks    Status  New  PT SHORT TERM GOAL #3   Title  Patient will improve FGA to >=23/30 to demonstrate decreasing fall risk.     Time  4    Period  Weeks    Status  New      PT SHORT TERM GOAL #4   Title  Patient will ambulate up to 500 ft over unlevel outdoor surfaces, including up/down curbs with no assistive device and supervision.    Time  4    Period  Weeks    Status  New        PT Long Term Goals - 06/25/17 2000      PT LONG TERM GOAL #1   Title  Patient will be independent with updated HEP and able to state plan for continued activity upon discharge from PT (Target for all LTGs 08/20/2017)    Time  8    Period  Weeks    Status  New    Target Date  08/20/17      PT LONG TERM GOAL #2   Title  Patient will improve FGA >=27/30 demonstrating a measurable improvement in his balance and gait.    Time  8    Period  Weeks    Status  New      PT LONG TERM GOAL #3   Title  Patient will ambulate >=1058ft over level and unlevel outdoor terrain modified  independent.     Time  8    Period  Weeks    Status  New            Plan - 07/12/17 1216    Clinical Impression Statement  Skilled session focused on gait training with increasing complexity of task (dual task, unlevel surfaces) with pt able to maintain his balance. He does decrease his velocity and even begins to maintain LLE in hip and knee flexion in stance. Focused on knee strength and control in terminal stance as he reports fear of left genu recurvatum causing him to "crouch" when LLE is tired. Patient with minimal episodes of mild recurvatum and educated to begin to allow himself to extend left knee in stance. HEP updated. Patient can continue to benefit from PT with re-assessment planned on or before 07/23/17.     Rehab Potential  Good    PT Frequency  2x / week    PT Duration  8 weeks    PT Treatment/Interventions  ADLs/Self Care Home Management;Electrical Stimulation;Gait training;DME Instruction;Stair training;Functional mobility training;Therapeutic activities;Therapeutic exercise;Balance training;Neuromuscular re-education;Manual techniques;Orthotic Fit/Training;Patient/family education;Passive range of motion    PT Next Visit Plan  continue to address left LE strengthening; stability with higher level gait; balance on compliant surfaces    PT Home Exercise Plan  standing hip abdct; seated hamstring curl with green band around ankles; terminal knee ext with blue band; marching; heel/toe raises; corner EC on foam: feet apart head turns, feet together hold 30 sec; piriformis stretch;     Consulted and Agree with Plan of Care  Patient       Patient will benefit from skilled therapeutic intervention in order to improve the following deficits and impairments:  Abnormal gait, Decreased activity tolerance, Decreased balance, Decreased mobility, Decreased knowledge of use of DME, Decreased endurance, Decreased strength, Impaired sensation, Impaired UE functional use, Impaired tone  Visit  Diagnosis: Muscle weakness (generalized)  Other abnormalities of gait and mobility  Unsteadiness on feet     Problem List Patient Active Problem List   Diagnosis Date Noted  . Hypotension due  to drugs   . Labile blood pressure   . Benign prostatic hyperplasia   . Hemiparesis affecting left side as late effect of stroke ([place])   . Basal ganglia infarction ([place]) 06/07/2017  . 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 ([place])   . Gait disturbance, post-stroke   . Hyperlipidemia   . Acute ischemic stroke ([place]) - R basal ganglia s/p IV tPA 06/05/2017  . Prostate nodule 10/30/2016  . Essential hypertension 08/05/2016  . External hemorrhoids without complication 09/32/3557  . Melanosis of colon 11/23/2015  . Diverticulosis of colon without hemorrhage 11/23/2015  . Internal hemorrhoids 11/23/2015  . Essential hypertension, benign 09/14/2014    Rexanne Mano, PT 07/12/2017, 12:30 PM  [facility] [address] [place], [name], [address] Phone: [phone]   Fax:  [phone]  Name: Robert Sandoval MRN: [id number] Date of Birth: Dec 29, 1960

## 2017-07-12 NOTE — Therapy (Signed)
[facility] [address] [place], [name], [address] Phone: [phone]   Fax:  [phone]  Occupational Therapy Treatment  Patient Details  Name: Robert Sandoval MRN: [id number] Date of Birth: 12/19/1960 Referring Provider: Delice Lesch MD   Encounter Date: 07/12/2017  OT End of Session - 07/12/17 0950    Visit Number  5    Number of Visits  17    Date for OT Re-Evaluation  08/20/17    Authorization Type  [employer] covered 100% 60 visit limit for each OT/PT    OT Start Time  [id number] pt arrived late    OT Stop Time  1015    OT Time Calculation (min)  27 min    Activity Tolerance  Patient tolerated treatment well    Behavior During Therapy  [facility] for tasks assessed/performed       Past Medical History:  Diagnosis Date  . Allergy   . BPH (benign prostatic hyperplasia)    followed by [facility]  . Hyperlipidemia   . Hypertension   . Low back pain radiating to left lower extremity    sees Dr. Tonita Cong    History reviewed. No pertinent surgical history.  There were no vitals filed for this visit.  Subjective Assessment - 07/12/17 0950    Subjective   Pt reports that he is doing well.    Pertinent History  R basal ganglia CVA 06/05/17; HTN, hyperlipidemia low back pain with left leg extremity radiculopathy    Patient Stated Goals  walk better, incr control in LUE, get back to work, improve endurance for IADLs    Currently in Pain?  No/denies         Picking up blocks with gripper set on level 3 ([place]) for sustained grip strength with mod difficulty/drops.  Completing Purdue pegboard with L hand for coordination with min difficulty.    Placing O'connor pegs in pegboard with tweezers with min-mod difficulty.  In standing, functional reaching outside base of support with squatting, lateral/overhead reaching for wt. Shift and trunk rotation to incr activity tolerance for IADL tasks with close supervision for  balance for squatting and min v.c. To keep feet apart more.                     OT Short Term Goals - 07/09/17 1208      OT SHORT TERM GOAL #1   Title  Pt will be independent with HEP for coordination.--check STGs 07/21/17    Time  4    Period  Weeks    Status  Achieved      OT SHORT TERM GOAL #2   Title  Pt will improve coordination for ADLs as shown by completing 9-hole peg test in less than 26sec.    Baseline  35.28sec    Time  4    Period  Weeks    Status  New      OT SHORT TERM GOAL #3   Title  Pt will improve L grip strength by at least 6lbs for IADLs.      Baseline  57lbs    Time  4    Period  Weeks    Status  New      OT SHORT TERM GOAL #4   Title  Pt will perform dynamic standing activities/IADL tasks for at least 77min without LOB or rest break.    Time  4    Period  Weeks    Status  Achieved 07/09/17 for pt report        OT Long Term Goals - 06/21/17 1227      OT LONG TERM GOAL #1   Title  Pt will be independent with strengthening HEP.--check LTGs 08/21/17    Time  8    Period  Weeks    Status  New      OT LONG TERM GOAL #2   Title  Pt will perform targeted overhead reaching for at least 29min without rest breaks and only 2 drops or less.      Time  8    Period  Weeks    Status  New      OT LONG TERM GOAL #3   Title  Pt will improve L grip strength by at least 12lbs for IADLs.      Baseline  57lbs    Time  8    Period  Weeks    Status  New      OT LONG TERM GOAL #4   Title  Pt will perform dynamic standing activities/IADL tasks for at least 71min without LOB or rest break (including lifting/carrying/reaching tasks).    Time  8    Period  Weeks    Status  New      OT LONG TERM GOAL #5   Title  Pt will perform all previous home maintenance tasks mod I.    Time  8    Period  Weeks    Status  New            Plan - 07/12/17 0254    Clinical Impression Statement  Pt is progressing with improving strength/activity tolerance  today.    Occupational performance deficits (Please refer to evaluation for details):  ADL's;IADL's;Leisure    Rehab Potential  Good    OT Frequency  2x / week    OT Duration  8 weeks    OT Treatment/Interventions  Self-care/ADL training;Therapeutic exercise;Patient/family education;Neuromuscular education;Moist Heat;Fluidtherapy;Energy conservation;Therapist, nutritional;Therapeutic activities;Balance training;Manual Therapy;Passive range of motion;DME and/or AE instruction;Ultrasound;Cryotherapy    Plan  continue with strength/coordination and activity tolerance    OT Home Exercise Plan  Education provided:  scapula strengthening HEP, coordination HEP, red putty HEP    Consulted and Agree with Plan of Care  Patient       Patient will benefit from skilled therapeutic intervention in order to improve the following deficits and impairments:  Decreased coordination, Decreased mobility, Impaired sensation, Decreased strength, Decreased endurance, Decreased activity tolerance, Decreased balance, Impaired UE functional use  Visit Diagnosis: Other lack of coordination  Muscle weakness (generalized)  Other abnormalities of gait and mobility  Unsteadiness on feet  Ataxia    Problem List Patient Active Problem List   Diagnosis Date Noted  . Hypotension due to drugs   . Labile blood pressure   . Benign prostatic hyperplasia   . Hemiparesis affecting left side as late effect of stroke ([place])   . Basal ganglia infarction ([place]) 06/07/2017  . 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 ([place])   . Gait disturbance, post-stroke   . Hyperlipidemia   . Acute ischemic stroke ([place]) - R basal ganglia s/p IV tPA 06/05/2017  . Prostate nodule 10/30/2016  . Essential hypertension 08/05/2016  . External hemorrhoids without complication 27/06/2374  . Melanosis of colon 11/23/2015  . Diverticulosis of colon without hemorrhage 11/23/2015  . Internal hemorrhoids  11/23/2015  . Essential hypertension, benign 09/14/2014    [facility] 07/12/2017, 9:52  AM  [facility] [address] [place], [name], [address] Phone: [phone]   Fax:  [phone]  Name: Robert Sandoval MRN: [id number] Date of Birth: [birth year]   Vianne Bulls, OTR/L [facility] [address] [place], [place]  [address] [phone] phone [phone] 07/12/17 10:00 AM

## 2017-07-16 ENCOUNTER — Ambulatory Visit: Payer: Managed Care, Other (non HMO) | Attending: Family Medicine | Admitting: Physical Therapy

## 2017-07-16 ENCOUNTER — Encounter: Payer: Self-pay | Admitting: Physical Therapy

## 2017-07-16 ENCOUNTER — Ambulatory Visit: Payer: Managed Care, Other (non HMO) | Admitting: Occupational Therapy

## 2017-07-16 DIAGNOSIS — R2681 Unsteadiness on feet: Secondary | ICD-10-CM | POA: Insufficient documentation

## 2017-07-16 DIAGNOSIS — M6281 Muscle weakness (generalized): Secondary | ICD-10-CM | POA: Insufficient documentation

## 2017-07-16 DIAGNOSIS — R270 Ataxia, unspecified: Secondary | ICD-10-CM | POA: Diagnosis present

## 2017-07-16 DIAGNOSIS — R2689 Other abnormalities of gait and mobility: Secondary | ICD-10-CM | POA: Diagnosis present

## 2017-07-16 DIAGNOSIS — R278 Other lack of coordination: Secondary | ICD-10-CM | POA: Diagnosis present

## 2017-07-16 DIAGNOSIS — I639 Cerebral infarction, unspecified: Secondary | ICD-10-CM | POA: Diagnosis present

## 2017-07-16 NOTE — Therapy (Signed)
[facility] [address] [place], [name], [address] Phone: [phone]   Fax:  [phone]  Physical Therapy Treatment  Patient Details  Name: Robert Sandoval MRN: [id number] Date of Birth: [birth year] Referring Provider: Delice Lesch MD   Encounter Date: 07/16/2017  PT End of Session - 07/16/17 0907    Visit Number  6    Number of Visits  17    Date for PT Re-Evaluation  08/24/17    Authorization - Visit Number  4    Authorization - Number of Visits  47    PT Start Time  0804 pt arrived late    PT Stop Time  0845    PT Time Calculation (min)  41 min    Equipment Utilized During Treatment  Gait belt    Activity Tolerance  Patient tolerated treatment well    Behavior During Therapy  [facility] for tasks assessed/performed       Past Medical History:  Diagnosis Date  . Allergy   . BPH (benign prostatic hyperplasia)    followed by [facility]  . Hyperlipidemia   . Hypertension   . Low back pain radiating to left lower extremity    sees Dr. Tonita Cong    History reviewed. No pertinent surgical history.  There were no vitals filed for this visit.  Subjective Assessment - 07/16/17 0807    Subjective  No new complaints. No falls or pain to report.     Pertinent History  R basal ganglia CVA 06/05/17; HTN, hyperlipidemia low back pain with left leg extremity radiculopathy     Patient Stated Goals  improve my leg strength and my walking    Currently in Pain?  No/denies    Pain Score  0-No pain                       [facility] Adult PT Treatment/Exercise - 07/16/17 0853      Ambulation/Gait   Curb  4: Min assist;Other (comment)    Curb Details (indicate cue type and reason)   pt needed VC for proper technique, required min assist coming down curb for stabililty no assistive device used      High Level Balance   High Level Balance Activities  Side stepping;Backward walking;Tandem walking    High Level Balance  Comments  On blue beam pt performed mini squat while side steping and tandem walking with occasional UE support for balance.      Neuro Re-ed    Neuro Re-ed Details   On bosu ball in corner for safety, narrow bos with EO, EC and head turns. Occasional UE support to maintain balance. 30 sec x 3 with side<>side and up<>down head turns      Knee/Hip Exercises: Stretches   Active Hamstring Stretch  Both;3 reps;30 seconds    Quad Stretch  Both;3 reps;30 seconds    Quad Stretch Limitations  cues on posture    Piriformis Stretch  Both;3 reps;30 seconds    Gastroc Stretch  Both;2 reps;30 seconds    Gastroc Stretch Limitations  cues on technique to increase stretch      Knee/Hip Exercises: Aerobic   Elliptical  3 min level 1.5 alternate fwd/bwd every 45-60 sec      Knee/Hip Exercises: Machines for Strengthening   Cybex Leg Press  bil LE's 80# 5 sec holds x 10 reps; LLE only 50# 5 sec hold x 10 reps      Knee/Hip Exercises: Standing  Other Standing Knee Exercises  bil SLS mini squat at parallel bars x10 reps; 1 set      Knee/Hip Exercises: Seated   Hamstring Curl  10 reps;Strengthening;AROM blue tband    Hamstring Limitations  needed cues to increase knee flexion          PT Short Term Goals - 06/25/17 1956      PT SHORT TERM GOAL #1   Title  Patient will be independent with HEP ([place] for all STGs 07/23/2017)    Time  4    Period  Weeks    Status  New    Target Date  07/23/17      PT SHORT TERM GOAL #2   Title  Patient will improve 5x sit to stand to <=9.0 seconds to demonstrate improved LE strength.    Time  4    Period  Weeks    Status  New      PT SHORT TERM GOAL #3   Title  Patient will improve FGA to >=23/30 to demonstrate decreasing fall risk.     Time  4    Period  Weeks    Status  New      PT SHORT TERM GOAL #4   Title  Patient will ambulate up to 500 ft over unlevel outdoor surfaces, including up/down curbs with no assistive device and supervision.    Time  4     Period  Weeks    Status  New        PT Long Term Goals - 06/25/17 2000      PT LONG TERM GOAL #1   Title  Patient will be independent with updated HEP and able to state plan for continued activity upon discharge from PT (Target for all LTGs 08/20/2017)    Time  8    Period  Weeks    Status  New    Target Date  08/20/17      PT LONG TERM GOAL #2   Title  Patient will improve FGA >=27/30 demonstrating a measurable improvement in his balance and gait.    Time  8    Period  Weeks    Status  New      PT LONG TERM GOAL #3   Title  Patient will ambulate >=1013ft over level and unlevel outdoor terrain modified independent.     Time  8    Period  Weeks    Status  New            Plan - 07/16/17 0909    Clinical Impression Statement  Today's skilled session focused on strengthening and balance exercises. With emphasis on strengthening the left LE to improve function. Pt is progressing toward goals and would benefit from further PT to increase functional independence and progresss toward unmet goals.     Rehab Potential  Good    PT Frequency  2x / week    PT Duration  8 weeks    PT Treatment/Interventions  ADLs/Self Care Home Management;Electrical Stimulation;Gait training;DME Instruction;Stair training;Functional mobility training;Therapeutic activities;Therapeutic exercise;Balance training;Neuromuscular re-education;Manual techniques;Orthotic Fit/Training;Patient/family education;Passive range of motion    PT Next Visit Plan  continue to address left LE strengthening; balance on compliant surfaces and dynamic gait.     PT Home Exercise Plan  standing hip abdct; seated hamstring curl with green band around ankles; terminal knee ext with blue band; marching; heel/toe raises; corner EC on foam: feet apart head turns, feet together hold 30 sec; piriformis stretch;  Consulted and Agree with Plan of Care  Patient       Patient will benefit from skilled therapeutic intervention in order  to improve the following deficits and impairments:  Abnormal gait, Decreased activity tolerance, Decreased balance, Decreased mobility, Decreased knowledge of use of DME, Decreased endurance, Decreased strength, Impaired sensation, Impaired UE functional use, Impaired tone  Visit Diagnosis: Muscle weakness (generalized)  Other abnormalities of gait and mobility  Unsteadiness on feet     Problem List Patient Active Problem List   Diagnosis Date Noted  . Hypotension due to drugs   . Labile blood pressure   . Benign prostatic hyperplasia   . Hemiparesis affecting left side as late effect of stroke ([place])   . Basal ganglia infarction ([place]) 06/07/2017  . 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 ([place])   . Gait disturbance, post-stroke   . Hyperlipidemia   . Acute ischemic stroke ([place]) - R basal ganglia s/p IV tPA 06/05/2017  . Prostate nodule 10/30/2016  . Essential hypertension 08/05/2016  . External hemorrhoids without complication 60/04/5995  . Melanosis of colon 11/23/2015  . Diverticulosis of colon without hemorrhage 11/23/2015  . Internal hemorrhoids 11/23/2015  . Essential hypertension, benign 09/14/2014    Halina Andreas, [place] 07/16/2017, 10:40 AM  [facility] [address] [place], [name], [address] Phone: [phone]   Fax:  [phone]  Name: Robert Sandoval MRN: [id number] Date of Birth: Nov 27, 1960

## 2017-07-16 NOTE — Progress Notes (Addendum)
[facility] [address] [place]. [place] [address] [phone]       OFFICE FOLLOW UP NOTE  [name] Date of Birth:  December 22, 1960 Medical Record Number:  [id number]   Reason for Referral:  hospital stroke follow up  CHIEF COMPLAINT:  Chief Complaint  Patient presents with  . Follow-up    Hospital Stroke follow up from hospital. Pt saw Dr Erlinda Hong room 9 pt is alone    HPI: Robert Sandoval is being seen today for initial visit in the office for right basal ganglia infarct status post IV TPA on 06/04/2017. History obtained from patient and chart review. Reviewed all radiology images and labs personally.  Mr.Rommie Castrois a 57 y.o.malewith history of HTN and HLD who was admitted for left arm and leg weakness.  CT head reviewed and negative for acute abnormality and therefore TPA was given on 06/05/2017 at 0256. Tolerated tPA without difficulty with resultant left hemiparesis.  MRI head reviewed and showed right BG/CR infarct likely related to small vessel disease. CTA head neck unremarkable.  2D echo showed an EF of 60 to 65%.  LDL 87 and recommended to start Lipitor 20 mg daily.  Patient was on aspirin 81 mg daily PTA and recommended DAPT for 3 weeks and then Plavix alone.  Patient was discharged to [facility] for continued therapies in stable condition.  Patient is being seen today for hospital follow-up.  He continues to have left hemiparesis with numbness and tingling but this has been improving.  He continues PT/OT at our neuro rehab clinic [place].  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  He has been taken out of work by his PCP until mid August and continues to feel at this time that he is not able to return to work due to continued fatigue that can make his symptoms worse especially when he drives 518+ miles.  He feels as though with the continuation of therapies he will be ready to return to work by mid August.  He has stopped taking  aspirin as he completed 3 weeks of DAPT but was continuing Plavix up until 2 days ago when he ran out of the prescription but prior to this he denies bleeding or bruising.  Recently ran out of Lipitor prescription 2 days ago but denied side effects of myalgias.  Blood pressure mildly elevated at today's appointment at 144/91 but does check this at home and is typically lower.  He states he is trying to stay active and eat healthy.  Denies new or worsening stroke/TIA symptoms.  ROS:   14 system review of systems performed and negative with exception of weakness and numbness  PMH:  Past Medical History:  Diagnosis Date  . Allergy   . BPH (benign prostatic hyperplasia)    followed by [facility]  . Hyperlipidemia   . Hypertension   . Low back pain radiating to left lower extremity    sees Dr. Tonita Cong  . Stroke [facility])     PSH: History reviewed. No pertinent surgical history.  Social History:  Social History   Socioeconomic History  . Marital status: Divorced    Spouse name: Not on file  . Number of children: Not on file  . Years of education: Not on file  . Highest education level: Not on file  Occupational History  . Not on file  Social Needs  . Financial resource strain: Not on file  . Food insecurity:  Worry: Not on file    Inability: Not on file  . Transportation needs:    Medical: Not on file    Non-medical: Not on file  Tobacco Use  . Smoking status: Never Smoker  . Smokeless tobacco: Never Used  Substance and Sexual Activity  . Alcohol use: Yes  . Drug use: No  . Sexual activity: Not on file  Lifestyle  . Physical activity:    Days per week: Not on file    Minutes per session: Not on file  . Stress: Not on file  Relationships  . Social connections:    Talks on phone: Not on file    Gets together: Not on file    Attends religious service: Not on file    Active member of club or organization: Not on file    Attends meetings of clubs or organizations: Not  on file    Relationship status: Not on file  . Intimate partner violence:    Fear of current or ex partner: Not on file    Emotionally abused: Not on file    Physically abused: Not on file    Forced sexual activity: Not on file  Other Topics Concern  . Not on file  Social History Narrative  . Not on file    Family History:  Family History  Problem Relation Age of Onset  . Hyperlipidemia Father   . Asthma Father     Medications:   Current Outpatient Medications on File Prior to Visit  Medication Sig Dispense Refill  . acetaminophen (TYLENOL) 325 MG tablet Take 1-2 tablets (325-650 mg total) by mouth every 4 (four) hours as needed for mild pain.    . fluticasone (FLONASE) 50 MCG/ACT nasal spray Place 1 spray into both nostrils daily.  2  . gabapentin (NEURONTIN) 300 MG capsule Take 1 capsule (300 mg total) by mouth 3 (three) times daily.    Marland Kitchen lisinopril (PRINIVIL,ZESTRIL) 10 MG tablet Take 1 tablet (10 mg total) by mouth daily. 30 tablet 0  . loratadine (CLARITIN) 10 MG tablet Take 1 tablet (10 mg total) by mouth daily.    . polyethylene glycol (MIRALAX / GLYCOLAX) packet Take 17 g by mouth 2 (two) times daily. 14 each 0  . tamsulosin (FLOMAX) 0.4 MG CAPS capsule Take 0.4 mg by mouth daily.     No current facility-administered medications on file prior to visit.     Allergies:  No Known Allergies   Physical Exam  Vitals:   07/17/17 0837  BP: (!) 144/91  Pulse: 77  Weight: 163 lb (73.9 kg)  Height: 5\' 8"  (1.727 m)   Body mass index is 24.78 kg/m. No exam data present  General: well developed, well nourished, pleasant middle-aged Hispanic male, seated, in no evident distress Head: head normocephalic and atraumatic.   Neck: supple with no carotid or supraclavicular bruits Cardiovascular: regular rate and rhythm, no murmurs Musculoskeletal: no deformity Skin:  no rash/petichiae Vascular:  Normal pulses all extremities  Neurologic Exam Mental Status: Awake and  fully alert. Oriented to place and time. Recent and remote memory intact. Attention span, concentration and fund of knowledge appropriate. Mood and affect appropriate.  Cranial Nerves: Fundoscopic exam reveals sharp disc margins. Pupils equal, briskly reactive to light. Extraocular movements full without nystagmus. Visual fields full to confrontation. Hearing intact. Facial sensation intact. Face, tongue, palate moves normally and symmetrically.  Motor: Normal bulk and tone. Normal strength in all tested extremity muscles.  4+/5 LUE and LLE Sensory.:  Decreased sensation in LUE and LLE compared to RUE and RLE Coordination: Rapid alternating movements normal and right side.  Finger-to-nose and heel-to-shin performed accurately on right side.  Orbits right arm over left arm. Gait and Station: Arises from chair without difficulty. Stance is normal. Gait demonstrates normal stride length and balance . Able to heel, toe and tandem walk without difficulty.  Reflexes: 1+ and symmetric. Toes downgoing.    NIHSS  1 Modified Rankin  2    Diagnostic Data (Labs, Imaging, Testing)  CT HEAD WO CONTRAST 06/05/2017 IMPRESSION: 1. No acute intracranial infarct or other abnormality identified. 2. ASPECTS is 10.  CT ANGIO HEAD W OR WO CONTRAST CTA NGIO NECK W OR WO CONTRAST 06/05/2016 IMPRESSION: Normal CTA of the head and neck with no evidence for emergent large vessel occlusion. No significant atheromatous disease for patient age. No hemodynamically significant or correctable stenosis.  MR BRAIN WO CONTRAST 06/06/2017 IMPRESSION: 1. Acute RIGHT basal ganglia/corona radiata infarct. Tiny RIGHT acute occipital posterior watershed territory infarcts. 2. Otherwise negative noncontrast MRI of the head for age.  ECHOCARDIOGRAM 06/05/2017 Study Conclusions - Left ventricle: The cavity size was normal. Wall thickness was   normal. Systolic function was normal. The estimated ejection   fraction was in  the range of 60% to 65%. Wall motion was normal;   there were no regional wall motion abnormalities. Doppler   parameters are consistent with abnormal left ventricular   relaxation (grade 1 diastolic dysfunction). - Aortic valve: There was mild to moderate regurgitation. - Pulmonary arteries: Systolic pressure was mildly increased. PA   peak pressure: 31 mm Hg (S). Impressions: - No cardiac source of emboli was indentified.   ASSESSMENT: Cataldo Cosgriff is a 57 y.o. 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 Vascular risk factors include HTN and HLD.   PLAN: -Continue clopidogrel 75 mg daily  and lipitor  for secondary stroke prevention -F/u with PCP regarding your HLD and HTN management -continue to monitor BP at home -Continue PT/OT at our neuro rehab clinic and patient was advised that if additional orders are needed to notify office -Maintain strict control of hypertension with blood pressure goal below 130/90, diabetes with hemoglobin A1c goal below 6.5% and cholesterol with LDL cholesterol (bad cholesterol) goal below 70 mg/dL. I also advised the patient to eat a healthy diet with plenty of whole grains, cereals, fruits and vegetables, exercise regularly and maintain ideal body weight.  Follow up in 3 months or call earlier if needed  Greater than 50% of time during this 25 minute visit was spent on counseling,explanation of diagnosis of right BG/CR infarct, reviewing risk factor management of HLD and HTN, planning of further management, discussion with patient and family and coordination of care    [name], [facility]  [facility] [address] [place], [place] [address]  Phone [phone] Fax [phone]

## 2017-07-16 NOTE — Therapy (Signed)
[facility] [address] [place], [name], [address] Phone: [phone]   Fax:  [phone]  Occupational Therapy Treatment  Patient Details  Name: Robert Sandoval MRN: [id number] Date of Birth: 26-Nov-1960 Referring Provider: Delice Lesch MD   Encounter Date: 07/16/2017  OT End of Session - 07/16/17 0927    Visit Number  6    Number of Visits  17    Date for OT Re-Evaluation  08/20/17    Authorization Type  [employer] covered 100% 60 visit limit for each OT/PT    OT Start Time  0845    OT Stop Time  0930    OT Time Calculation (min)  45 min    Activity Tolerance  Patient tolerated treatment well    Behavior During Therapy  [facility] for tasks assessed/performed       Past Medical History:  Diagnosis Date  . Allergy   . BPH (benign prostatic hyperplasia)    followed by [facility]  . Hyperlipidemia   . Hypertension   . Low back pain radiating to left lower extremity    sees Dr. Tonita Cong    No past surgical history on file.  There were no vitals filed for this visit.  Subjective Assessment - 07/16/17 0849    Pertinent History  R basal ganglia CVA 06/05/17; HTN, hyperlipidemia low back pain with left leg extremity radiculopathy  (Pended)     Patient Stated Goals  walk better, incr control in LUE, get back to work, improve endurance for IADLs  (Pended)     Currently in Pain?  No/denies  (Pended)                    OT Treatments/Exercises (OP) - 07/16/17 0919      Exercises   Exercises  Theraputty;Shoulder      Shoulder Exercises: ROM/Strengthening   UBE (Upper Arm Bike)  UBE x 8 min. level 8 resistance for UB strength/endurance      Theraputty   Theraputty - Grip  Upgraded to green resistance putty for Lt hand - pt performed gripping x 20 reps. Issued green putty. Pt instructed to continue red putty for pinch strength      Neurological Re-education Exercises   Other Exercises 1  Dynamic standing  balance while reaching to very low surfaces on bilateral sides to place and retrieve clothespins (as well as for pinch strength LUE) - Pt w/ no LOB but does report Rt knee has been hurting some d/t compensation from LT LE weakness      Functional Reaching Activities   Mid Level  Mid to high level reaching LUE to place small pegs in pegboard for LUE functional use, endurance, and coordination. Pt had min drops LUE but copied peg design accurately               OT Short Term Goals - 07/09/17 1208      OT SHORT TERM GOAL #1   Title  Pt will be independent with HEP for coordination.--check STGs 07/21/17    Time  4    Period  Weeks    Status  Achieved      OT SHORT TERM GOAL #2   Title  Pt will improve coordination for ADLs as shown by completing 9-hole peg test in less than 26sec.    Baseline  35.28sec    Time  4    Period  Weeks    Status  New  OT SHORT TERM GOAL #3   Title  Pt will improve L grip strength by at least 6lbs for IADLs.      Baseline  57lbs    Time  4    Period  Weeks    Status  New      OT SHORT TERM GOAL #4   Title  Pt will perform dynamic standing activities/IADL tasks for at least 37min without LOB or rest break.    Time  4    Period  Weeks    Status  Achieved 07/09/17 for pt report        OT Long Term Goals - 06/21/17 1227      OT LONG TERM GOAL #1   Title  Pt will be independent with strengthening HEP.--check LTGs 08/21/17    Time  8    Period  Weeks    Status  New      OT LONG TERM GOAL #2   Title  Pt will perform targeted overhead reaching for at least 44min without rest breaks and only 2 drops or less.      Time  8    Period  Weeks    Status  New      OT LONG TERM GOAL #3   Title  Pt will improve L grip strength by at least 12lbs for IADLs.      Baseline  57lbs    Time  8    Period  Weeks    Status  New      OT LONG TERM GOAL #4   Title  Pt will perform dynamic standing activities/IADL tasks for at least 21min without LOB or rest  break (including lifting/carrying/reaching tasks).    Time  8    Period  Weeks    Status  New      OT LONG TERM GOAL #5   Title  Pt will perform all previous home maintenance tasks mod I.    Time  8    Period  Weeks    Status  New            Plan - 07/16/17 0947    Clinical Impression Statement  Pt is progressing with improving strength/activity tolerance and coordination today.    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  Pt is unable to work and perform all previous IADLs currently due to deficits.    Occupational performance deficits (Please refer to evaluation for details):  ADL's;IADL's;Leisure    Rehab Potential  Good    OT Frequency  2x / week    OT Duration  8 weeks    OT Treatment/Interventions  Self-care/ADL training;Therapeutic exercise;Patient/family education;Neuromuscular education;Moist Heat;Fluidtherapy;Energy conservation;Therapist, nutritional;Therapeutic activities;Balance training;Manual Therapy;Passive range of motion;DME and/or AE instruction;Ultrasound;Cryotherapy    Plan  try gross motor coordination tasks (tossing ball, dribbling ball, etc) for timing, review wt bearing and scapula strengthening HEP     OT Home Exercise Plan  Education provided:  scapula strengthening HEP, coordination HEP, red putty HEP    Consulted and Agree with Plan of Care  Patient       Patient will benefit from skilled therapeutic intervention in order to improve the following deficits and impairments:  Decreased coordination, Decreased mobility, Impaired sensation, Decreased strength, Decreased endurance, Decreased activity tolerance, Decreased balance, Impaired UE functional use  Visit Diagnosis: Other lack of coordination  Muscle weakness (  generalized)  Unsteadiness on feet    Problem List Patient Active Problem List   Diagnosis Date  Noted  . Hypotension due to drugs   . Labile blood pressure   . Benign prostatic hyperplasia   . Hemiparesis affecting left side as late effect of stroke ([place])   . Basal ganglia infarction ([place]) 06/07/2017  . 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 ([place])   . Gait disturbance, post-stroke   . Hyperlipidemia   . Acute ischemic stroke ([place]) - R basal ganglia s/p IV tPA 06/05/2017  . Prostate nodule 10/30/2016  . Essential hypertension 08/05/2016  . External hemorrhoids without complication 64/33/2951  . Melanosis of colon 11/23/2015  . Diverticulosis of colon without hemorrhage 11/23/2015  . Internal hemorrhoids 11/23/2015  . Essential hypertension, benign 09/14/2014    Carey Bullocks, OTR/L 07/16/2017, 9:30 AM  [facility] [address] [place], [name], [address] Phone: [phone]   Fax:  [phone]  Name: Robert Sandoval MRN: [id number] Date of Birth: 1960/06/10

## 2017-07-17 ENCOUNTER — Encounter: Payer: Self-pay | Admitting: Occupational Therapy

## 2017-07-17 ENCOUNTER — Encounter: Payer: Self-pay | Admitting: Adult Health

## 2017-07-17 ENCOUNTER — Ambulatory Visit: Payer: Managed Care, Other (non HMO) | Admitting: Occupational Therapy

## 2017-07-17 ENCOUNTER — Encounter: Payer: Self-pay | Admitting: Physical Therapy

## 2017-07-17 ENCOUNTER — Ambulatory Visit (INDEPENDENT_AMBULATORY_CARE_PROVIDER_SITE_OTHER): Payer: Managed Care, Other (non HMO) | Admitting: Adult Health

## 2017-07-17 ENCOUNTER — Ambulatory Visit: Payer: Managed Care, Other (non HMO) | Admitting: Physical Therapy

## 2017-07-17 VITALS — BP 144/91 | HR 77 | Ht 68.0 in | Wt 163.0 lb

## 2017-07-17 DIAGNOSIS — R2681 Unsteadiness on feet: Secondary | ICD-10-CM

## 2017-07-17 DIAGNOSIS — M6281 Muscle weakness (generalized): Secondary | ICD-10-CM

## 2017-07-17 DIAGNOSIS — I10 Essential (primary) hypertension: Secondary | ICD-10-CM | POA: Diagnosis not present

## 2017-07-17 DIAGNOSIS — E785 Hyperlipidemia, unspecified: Secondary | ICD-10-CM

## 2017-07-17 DIAGNOSIS — R270 Ataxia, unspecified: Secondary | ICD-10-CM

## 2017-07-17 DIAGNOSIS — R278 Other lack of coordination: Secondary | ICD-10-CM

## 2017-07-17 DIAGNOSIS — I639 Cerebral infarction, unspecified: Secondary | ICD-10-CM

## 2017-07-17 DIAGNOSIS — R2689 Other abnormalities of gait and mobility: Secondary | ICD-10-CM

## 2017-07-17 MED ORDER — CLOPIDOGREL BISULFATE 75 MG PO TABS: 75.0000 mg | ORAL_TABLET | Freq: Every day | ORAL | 0 refills | Status: DC

## 2017-07-17 MED ORDER — ATORVASTATIN CALCIUM 20 MG PO TABS: 20.0000 mg | ORAL_TABLET | Freq: Every day | ORAL | 0 refills | Status: DC

## 2017-07-17 NOTE — Patient Instructions (Signed)
Lie on right side - lift leg leg - make circles clockwise and counterclockwise - 10 reps each direction HIP: Abduction - Side-Lying    Lie on side, legs straight and in line with trunk. Squeeze glutes. Raise top leg up and slightly back. Point toes forward. 10reps per set, _1__ sets per day, _5__ days per week Bend bottom leg to stabilize pelvis.    KNEE: Flexion - Prone - can use band    Bend knee. Raise heel toward buttocks. Do not raise hips. _10__ reps per set, _3__ sets per day, _5__ days per week  Walking on Toes    Walk on toes for _20___ feet while continuing on a straight path.  Do _1-2___ sessions per day.   Walking on Heels    Walk on heels for _20__ feet while continuing on a straight path. Do _1-2__ sessions per day.  Copyright  VHI. All rights reserved.  Heel Raise: Unilateral (Standing)    Balance on left foot, then rise on ball of foot. Repeat __10__ times per set. Do __3__ sets per session. Do _1-2___ sessions per day.  http://orth.exer.us/40   Copyright  VHI. All rights reserved.

## 2017-07-17 NOTE — Therapy (Signed)
[facility] [address] [place], [name], [address] Phone: [phone]   Fax:  [phone]  Occupational Therapy Treatment  Patient Details  Name: Robert Sandoval MRN: [id number] Date of Birth: [birth year] Referring Provider: Delice Lesch MD   Encounter Date: 07/17/2017  OT End of Session - 07/17/17 0937    Visit Number  7    Number of Visits  17    Date for OT Re-Evaluation  08/20/17    Authorization Type  [employer] covered 100% 60 visit limit for each OT/PT    OT Start Time  0934    OT Stop Time  1015    OT Time Calculation (min)  41 min    Activity Tolerance  Patient tolerated treatment well    Behavior During Therapy  [facility] for tasks assessed/performed       Past Medical History:  Diagnosis Date  . Allergy   . BPH (benign prostatic hyperplasia)    followed by [facility]  . Hyperlipidemia   . Hypertension   . Low back pain radiating to left lower extremity    sees Dr. Tonita Cong  . Stroke [facility])     History reviewed. No pertinent surgical history.  There were no vitals filed for this visit.  Subjective Assessment - 07/16/17 0849    Pertinent History  R basal ganglia CVA 06/05/17; HTN, hyperlipidemia low back pain with left leg extremity radiculopathy  (Pended)     Patient Stated Goals  walk better, incr control in LUE, get back to work, improve endurance for IADLs  (Pended)     Currently in Pain?  No/denies  (Pended)              Gross motor coordination activities:  Ambulating while bouncing ball with LUE with min-mod difficulty.  Then standing with wt. Shifting with min v.c. For balance while tossing/catching 2 scarves (one in each hand) simultaneously with therapist with min difficulty, improving with repetition.   Placing grooved pegs in pegboard with min difficulty for incr in-hand manipulation/coordination.       UBE (Upper Arm Bike)  UBE x 6 min. level 8 resistance for UB strength/endurance  without rest (forward/backwards)         Functional Reaching Activities   High Level high level reaching LUE to place small pegs in pegboard for LUE functional use, endurance, and coordination. Pt had min drops LUE but copied peg design accurately             OT Education - 07/17/17 0938    Education Details  Reviewed wt. bearing and scapula strengthening HEP--pt returned demo each    Person(s) Educated  Patient    Methods  Explanation;Verbal cues    Comprehension  Verbalized understanding;Returned demonstration       OT Short Term Goals - 07/09/17 1208      OT SHORT TERM GOAL #1   Title  Pt will be independent with HEP for coordination.--check STGs 07/21/17    Time  4    Period  Weeks    Status  Achieved      OT SHORT TERM GOAL #2   Title  Pt will improve coordination for ADLs as shown by completing 9-hole peg test in less than 26sec.    Baseline  35.28sec    Time  4    Period  Weeks    Status  New      OT SHORT TERM GOAL #3   Title  Pt will improve  L grip strength by at least 6lbs for IADLs.      Baseline  57lbs    Time  4    Period  Weeks    Status  New      OT SHORT TERM GOAL #4   Title  Pt will perform dynamic standing activities/IADL tasks for at least 52min without LOB or rest break.    Time  4    Period  Weeks    Status  Achieved 07/09/17 for pt report        OT Long Term Goals - 06/21/17 1227      OT LONG TERM GOAL #1   Title  Pt will be independent with strengthening HEP.--check LTGs 08/21/17    Time  8    Period  Weeks    Status  New      OT LONG TERM GOAL #2   Title  Pt will perform targeted overhead reaching for at least 89min without rest breaks and only 2 drops or less.      Time  8    Period  Weeks    Status  New      OT LONG TERM GOAL #3   Title  Pt will improve L grip strength by at least 12lbs for IADLs.      Baseline  57lbs    Time  8    Period  Weeks    Status  New      OT LONG TERM GOAL #4   Title  Pt will perform dynamic  standing activities/IADL tasks for at least 50min without LOB or rest break (including lifting/carrying/reaching tasks).    Time  8    Period  Weeks    Status  New      OT LONG TERM GOAL #5   Title  Pt will perform all previous home maintenance tasks mod I.    Time  8    Period  Weeks    Status  New            Plan - 07/17/17 0936    Clinical Impression Statement  Pt continues to demo improving strength/activity tolerance and coordination today.    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  Pt is unable to work and perform all previous IADLs currently due to deficits.    Occupational performance deficits (Please refer to evaluation for details):  ADL's;IADL's;Leisure    Rehab Potential  Good    OT Frequency  2x / week    OT Duration  8 weeks    OT Treatment/Interventions  Self-care/ADL training;Therapeutic exercise;Patient/family education;Neuromuscular education;Moist Heat;Fluidtherapy;Energy conservation;Therapist, nutritional;Therapeutic activities;Balance training;Manual Therapy;Passive range of motion;DME and/or AE instruction;Ultrasound;Cryotherapy    Plan  continue with coordination and activity tolerance    OT Home Exercise Plan  Education provided:  scapula strengthening HEP, coordination HEP, red putty HEP    Consulted and Agree with Plan of Care  Patient       Patient will benefit from skilled therapeutic intervention in order to improve the following deficits and impairments:  Decreased coordination, Decreased mobility, Impaired sensation, Decreased strength, Decreased endurance, Decreased activity tolerance, Decreased balance, Impaired UE functional use  Visit Diagnosis: Other lack of coordination  Muscle weakness (generalized)  Unsteadiness on feet  Other abnormalities of gait and mobility  Ataxia    Problem List Patient  Active Problem List  Diagnosis Date Noted  . Hypotension due to drugs   . Labile blood pressure   . Benign prostatic hyperplasia   . Hemiparesis affecting left side as late effect of stroke ([place])   . Basal ganglia infarction ([place]) 06/07/2017  . 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 ([place])   . Gait disturbance, post-stroke   . Hyperlipidemia   . Acute ischemic stroke ([place]) - R basal ganglia s/p IV tPA 06/05/2017  . Prostate nodule 10/30/2016  . Essential hypertension 08/05/2016  . External hemorrhoids without complication 08/65/7846  . Melanosis of colon 11/23/2015  . Diverticulosis of colon without hemorrhage 11/23/2015  . Internal hemorrhoids 11/23/2015  . Essential hypertension, benign 09/14/2014    [facility] 07/17/2017, 10:07 AM  [facility] [address] [place], [name], [address] Phone: [phone]   Fax:  [phone]  Name: Edis Huish MRN: [id number] Date of Birth: [birth year]   Vianne Bulls, OTR/L [facility] [address] [place], [place]  [address] [phone] phone [phone] 07/17/17 10:07 AM

## 2017-07-17 NOTE — Patient Instructions (Signed)
Continue clopidogrel 75 mg daily  and lipitor  for secondary stroke prevention   Continue to follow up with PCP regarding cholesterol and blood pressure management   Continue physical and occupational therapies - if additional therapy is needed please let us know  Continue to monitor blood pressure at home  Maintain strict control of hypertension with blood pressure goal below 130/90, diabetes with hemoglobin A1c goal below 6.5% and cholesterol with LDL cholesterol (bad cholesterol) goal below 70 mg/dL. I also advised the patient to eat a healthy diet with plenty of whole grains, cereals, fruits and vegetables, exercise regularly and maintain ideal body weight.  Followup in the future with me in 3 months or call earlier if needed         Thank you for coming to see [place] at [facility]. I hope we have been able to provide you high quality care today.  You may receive a patient satisfaction survey over the next few weeks. We would appreciate your feedback and comments so that we may continue to improve ourselves and the health of our patients.

## 2017-07-18 ENCOUNTER — Encounter: Payer: Self-pay | Admitting: Physical Therapy

## 2017-07-18 NOTE — Therapy (Signed)
[facility] [address] [place], [name], [address] Phone: [phone]   Fax:  [phone]  Physical Therapy Treatment  Patient Details  Name: Robert Sandoval MRN: [id number] Date of Birth: 1960/10/07 Referring Provider: Delice Lesch MD   Encounter Date: 07/17/2017  PT End of Session - 07/18/17 1436    Visit Number  7    Number of Visits  17    Date for PT Re-Evaluation  08/24/17    Authorization - Visit Number  5    Authorization - Number of Visits  60    PT Start Time  7371    PT Stop Time  1100    PT Time Calculation (min)  44 min       Past Medical History:  Diagnosis Date  . Allergy   . BPH (benign prostatic hyperplasia)    followed by [facility]  . Hyperlipidemia   . Hypertension   . Low back pain radiating to left lower extremity    sees Dr. Tonita Cong  . Stroke [facility])     History reviewed. No pertinent surgical history.  There were no vitals filed for this visit.  Subjective Assessment - 07/17/17 1356    Subjective  Pt states he is getting stronger; states he is having some sciatica today (Lt side) but states he did piriformis stretches    Pertinent History  R basal ganglia CVA 06/05/17; HTN, hyperlipidemia low back pain with left leg extremity radiculopathy     Patient Stated Goals  improve my leg strength and my walking    Currently in Pain?  Yes    Pain Score  4     Pain Location  Back    Pain Orientation  Left    Pain Descriptors / Indicators  Aching    Pain Type  Chronic pain    Pain Onset  More than a month ago    Pain Frequency  Intermittent          TherEx: Updated HEP - see pt instructions for details  Pt performed Lt hip abduction in sidelying - graded 4/5 manual muscle test; pt performed hip abduction - making circles clockwise and counterclockwise 10 reps each with LLE; pt had difficulty  Performing this exercise with smooth, controlled movement  Pt performed prone Lt hip  extension with Lt knee flexed for Lt knee flexion control - 10 reps Lt knee flexion and extension with 5# weight with cues for eccentric control  In quadruped position - LLE extended - Lt knee flexion/extension with min assist to maintain Lt hip in lifted (extended) position  Lt hip extension _ small pulsed movements - with Lt knee flexed 10 reps - pushing Lt foot up toward ceiling  Manual Muscle test grades:  Lt knee flexors 4-/5:  Lt plantarflexors 3-/5:  Lt dorsiflexors 4+/5 in seated position:  3+/5 with pt standing  Pt amb. 35' x 2 reps each on tip toes for balance/plantarflexor strengthening:  35' x 2 reps on heel for dorsiflexor strengthening/balance  Pt performed Lt gastroc strengthening in closed chain position off side of high/low mat table 10 reps x 2 sets;  Rt foot placed on 4" block for increased LLE weight bearing            [facility] Adult PT Treatment/Exercise - 07/18/17 0001      Transfers   Transfers  Sit to Stand    Number of Reps  10 reps    Comments  Rt foot on  balance bubble for incr. weight bearing on LLE for strengthening      Ambulation/Gait   Ambulation/Gait  Yes    Ambulation/Gait Assistance  5: Supervision    Ambulation/Gait Assistance Details  mild Trendelenburg and decr. push off on LLE     Ambulation Distance (Feet)  100 Feet    Assistive device  None    Gait Pattern  Decreased dorsiflexion - left;Trendelenburg;Left foot flat decr. push off in stance on LLE    Ambulation Surface  Level;Indoor               PT Short Term Goals - 07/16/17 1034      PT SHORT TERM GOAL #1   Title  Patient will be independent with HEP (Target for all STGs 07/23/2017)    Time  4    Period  Weeks    Status  New      PT SHORT TERM GOAL #2   Title  Patient will improve 5x sit to stand to <=9.0 seconds to demonstrate improved LE strength.    Time  4    Period  Weeks    Status  New      PT SHORT TERM GOAL #3   Title  Patient will improve FGA to >=23/30 to  demonstrate decreasing fall risk.     Time  4    Period  Weeks    Status  New      PT SHORT TERM GOAL #4   Title  Patient will ambulate up to 500 ft over unlevel outdoor surfaces, including up/down curbs with no assistive device and supervision.    Time  4    Period  Weeks    Status  New        PT Long Term Goals - 06/25/17 2000      PT LONG TERM GOAL #1   Title  Patient will be independent with updated HEP and able to state plan for continued activity upon discharge from PT (Target for all LTGs 08/20/2017)    Time  8    Period  Weeks    Status  New    Target Date  08/20/17      PT LONG TERM GOAL #2   Title  Patient will improve FGA >=27/30 demonstrating a measurable improvement in his balance and gait.    Time  8    Period  Weeks    Status  New      PT LONG TERM GOAL #3   Title  Patient will ambulate >=1068ft over level and unlevel outdoor terrain modified independent.     Time  8    Period  Weeks    Status  New            Plan - 07/18/17 1436    Clinical Impression Statement  Pt demonstrates decreased coordination and control and minimally decreased strength in Lt hip abductors, hip extensors and hamstrings and dorsiflexors (greater weakness noted out of synergy pattern than in synergy pattern) as to be expected;  pt continues to exhibit mild gait deviations due to LLE weakness with high level balance deficits secondary to LLE weakness>  Pt's HEP was updated to include strengthening exercises in anti-gravity positions and balance exercises to address weakness and decreased muscle endurance of Lt dorsiflexors and plantarflexors.  Rehab Potential  Good    PT Frequency  2x / week    PT Duration  8 weeks    PT Treatment/Interventions  ADLs/Self Care Home Management;Electrical Stimulation;Gait training;DME Instruction;Stair  training;Functional mobility training;Therapeutic activities;Therapeutic exercise;Balance training;Neuromuscular re-education;Manual techniques;Orthotic Fit/Training;Patient/family education;Passive range of motion    PT Next Visit Plan  STG'S DUE 07-23-17 - continue to address left LE strengthening and decr. muscle endurance of Lt dorsiflexors & plantarflexors    PT Home Exercise Plan  updated HEP on 07-17-17 - see pt instructions    Consulted and Agree with Plan of Care  Patient       Patient will benefit from skilled therapeutic intervention in order to improve the following deficits and impairments:  Abnormal gait, Decreased activity tolerance, Decreased balance, Decreased mobility, Decreased knowledge of use of DME, Decreased endurance, Decreased strength, Impaired sensation, Impaired UE functional use, Impaired tone  Visit Diagnosis: Muscle weakness (generalized)  Other lack of coordination  Unsteadiness on feet     Problem List Patient Active Problem List   Diagnosis Date Noted  . Hypotension due to drugs   . Labile blood pressure   . Benign prostatic hyperplasia   . Hemiparesis affecting left side as late effect of stroke ([place])   . Basal ganglia infarction ([place]) 06/07/2017  . 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 Gait disturbance, post-stroke   . Hyperlipidemia   . Acute ischemic stroke ([place]) - R basal ganglia s/p IV tPA 06/05/2017  . Prostate nodule 10/30/2016  . Essential hypertension 08/05/2016  . External hemorrhoids without complication 84/66/5993  . Melanosis of colon 11/23/2015  . Diverticulosis of colon without hemorrhage 11/23/2015  . Internal hemorrhoids 11/23/2015  . Essential hypertension, benign 09/14/2014    Alda Lea, PT 07/18/2017, 2:44 PM  [facility] [address] [place], [name], [address] Phone: [phone]   Fax:   [phone]  Name: Madhav Mohon MRN: [id number] Date of Birth: June 25, 1960

## 2017-07-23 NOTE — Progress Notes (Signed)
I agree with the above plan 

## 2017-07-24 ENCOUNTER — Encounter: Payer: Self-pay | Admitting: Physical Therapy

## 2017-07-24 ENCOUNTER — Encounter: Payer: Self-pay | Admitting: Occupational Therapy

## 2017-07-24 ENCOUNTER — Ambulatory Visit: Payer: Managed Care, Other (non HMO) | Admitting: Physical Therapy

## 2017-07-24 ENCOUNTER — Ambulatory Visit: Payer: Managed Care, Other (non HMO) | Admitting: Occupational Therapy

## 2017-07-24 DIAGNOSIS — M6281 Muscle weakness (generalized): Secondary | ICD-10-CM | POA: Diagnosis not present

## 2017-07-24 DIAGNOSIS — R2689 Other abnormalities of gait and mobility: Secondary | ICD-10-CM

## 2017-07-24 DIAGNOSIS — R278 Other lack of coordination: Secondary | ICD-10-CM

## 2017-07-24 DIAGNOSIS — R270 Ataxia, unspecified: Secondary | ICD-10-CM

## 2017-07-24 DIAGNOSIS — R2681 Unsteadiness on feet: Secondary | ICD-10-CM

## 2017-07-24 NOTE — Therapy (Signed)
[facility] [address] [place], [name], [address] Phone: [phone]   Fax:  [phone]  Physical Therapy Treatment  Patient Details  Name: Robert Sandoval MRN: [id number] Date of Birth: [birth year] Referring Provider: Delice Lesch MD   Encounter Date: 07/24/2017  PT End of Session - 07/24/17 0939    Visit Number  8    Number of Visits  17    Date for PT Re-Evaluation  08/24/17    Authorization - Visit Number  5    Authorization - Number of Visits  60    PT Start Time  6144 pt late arrival    PT Stop Time  0930    PT Time Calculation (min)  40 min    Activity Tolerance  Patient tolerated treatment well;No increased pain    Behavior During Therapy  WFL for tasks assessed/performed       Past Medical History:  Diagnosis Date  . Allergy   . BPH (benign prostatic hyperplasia)    followed by [facility]  . Hyperlipidemia   . Hypertension   . Low back pain radiating to left lower extremity    sees Dr. Tonita Cong  . Stroke [facility])     History reviewed. No pertinent surgical history.  There were no vitals filed for this visit.  Subjective Assessment - 07/24/17 0855    Subjective  Pt reports he did new HEP and sidelying hip abdct with circles caused sciatic/back pain. Standing hip abdct with band does not cause pain and instructed to do in standing    Pertinent History  R basal ganglia CVA 06/05/17; HTN, hyperlipidemia low back pain with left leg extremity radiculopathy     Patient Stated Goals  improve my leg strength and my walking    Currently in Pain?  No/denies    Pain Onset  More than a month ago         [facility] PT Assessment - 07/24/17 0903      Functional Gait  Assessment   Gait Level Surface  Walks 20 ft in less than 5.5 sec, no assistive devices, good speed, no evidence for imbalance, normal gait pattern, deviates no more than 6 in outside of the 12 in walkway width. 5.09    Change in Gait Speed  Able to  smoothly change walking speed without loss of balance or gait deviation. Deviate no more than 6 in outside of the 12 in walkway width.    Gait with Horizontal Head Turns  Performs head turns smoothly with no change in gait. Deviates no more than 6 in outside 12 in walkway width    Gait with Vertical Head Turns  Performs head turns with no change in gait. Deviates no more than 6 in outside 12 in walkway width.    Gait and Pivot Turn  Pivot turns safely within 3 sec and stops quickly with no loss of balance.    Step Over Obstacle  Is able to step over 2 stacked shoe boxes taped together (9 in total height) without changing gait speed. No evidence of imbalance.    Gait with Narrow Base of Support  Ambulates 7-9 steps.    Gait with Eyes Closed  Walks 20 ft, no assistive devices, good speed, no evidence of imbalance, normal gait pattern, deviates no more than 6 in outside 12 in walkway width. Ambulates 20 ft in less than 7 sec.    Ambulating Backwards  Walks 20 ft, uses assistive device, slower speed, mild  gait deviations, deviates 6-10 in outside 12 in walkway width.    Steps  Alternating feet, no rail.    Total Score  28                   [facility] Adult PT Treatment/Exercise - 07/24/17 0859      Ambulation/Gait   Ambulation/Gait Assistance  6: Modified independent (Device/Increase time)    Ambulation/Gait Assistance Details  trendelenberg, left quad "gives" in weight bearing    Ambulation Distance (Feet)  500 Feet    Assistive device  None    Gait Pattern  Decreased dorsiflexion - left;Trendelenburg;Left foot flat;Decreased weight shift to left;Left flexed knee in stance;Poor foot clearance - left    Ambulation Surface  Level;Unlevel;Indoor;Outdoor;Paved;Other (comment) pinestraw    Curb  7: Independent    Curb Details (indicate cue type and reason)  outside curb no difficulties; stepped over cement parking stop no difficulties      Knee/Hip Exercises: Aerobic   Nustep  L4 4.5 minutes  warm-up due to sciatic pain on arrival; pain relieved while on Nustep      supine LLE SLR with external rotation to isolate medial quads x 10 reps with vc for technique (including setting "core" muscles prior to lifting leg). Clearly fatigued after 10 reps hooklying squeeze ball and extend bend left knee x 10 reps  Pt denied back pain with either exercise       PT Education - 07/24/17 0938    Education Details  added to HEP; results of STGs    Person(s) Educated  Patient    Methods  Explanation;Demonstration;Verbal cues;Handout    Comprehension  Verbalized understanding;Returned demonstration;Verbal cues required;Need further instruction       PT Short Term Goals - 07/24/17 0940      PT SHORT TERM GOAL #1   Title  Patient will be independent with HEP (Target for all STGs 07/23/2017)    Baseline  7/9 with basic HEP, continue to update as he progresses    Time  4    Period  Weeks    Status  Achieved      PT SHORT TERM GOAL #2   Title  Patient will improve 5x sit to stand to <=9.0 seconds to demonstrate improved LE strength.    Baseline  7/9 11.3 sec (pt reports afraid to move faster due to anticipation of back/sciatic pain)    Time  4    Period  Weeks    Status  Not Met      PT SHORT TERM GOAL #3   Title  Patient will improve FGA to >=23/30 to demonstrate decreasing fall risk.     Baseline  7/9 28    Time  4    Period  Weeks    Status  Achieved      PT SHORT TERM GOAL #4   Title  Patient will ambulate up to 500 ft over unlevel outdoor surfaces, including up/down curbs with no assistive device and supervision.    Baseline  7/9 modified independent (slower pace, gait deviations but no LOB)     Time  4    Period  Weeks    Status  Achieved        PT Long Term Goals - 07/24/17 0459      PT LONG TERM GOAL #1   Title  Patient will be independent with updated HEP and able to state plan for continued activity upon discharge from PT (Target for all LTGs 08/20/2017)  Time   8    Period  Weeks    Status  New      PT LONG TERM GOAL #2   Title  Patient will improve FGA >=27/30 demonstrating a measurable improvement in his balance and gait.    Baseline  7/9 28/30    Time  8    Period  Weeks    Status  Achieved      PT LONG TERM GOAL #3   Title  Patient will ambulate >=1036f over level and unlevel outdoor terrain modified independent.     Time  8    Period  Weeks    Status  New            Plan - 07/24/17 0943    Clinical Impression Statement  Patient met 3 of 4 STGs (4th goal not met due to pt's fear of moving too fast and increasing his back/sciatic pain). He reports sitting increases his sciatic pain and typically does not hurt when he is walking. Patient with LLE weakness with incr knee flexion on contact and poor pushoff (hip, knee, and PF lacking) with almost antalgic-looking gait. Pt denies pain and gait impairments due to LLE weakness. At most he has been able to walk 1500 steps per his pedometer before needing a standing rest break (limited by LLE fatigue). Patient can continue to benefit from PT to maximize independence and safety with mobility with pt's goal to return to work.     Rehab Potential  Good    PT Frequency  2x / week    PT Duration  8 weeks    PT Treatment/Interventions  ADLs/Self Care Home Management;Electrical Stimulation;Gait training;DME Instruction;Stair training;Functional mobility training;Therapeutic activities;Therapeutic exercise;Balance training;Neuromuscular re-education;Manual techniques;Orthotic Fit/Training;Patient/family education;Passive range of motion    PT Next Visit Plan  continue to address left LE strengthening (quads, esp medial, hip extension (mindful of back pain), and PF);  and decr. muscle endurance of Lt dorsiflexors & plantarflexors (begin treadmill walking to incr endurance)    PT Home Exercise Plan  updated HEP on 07-17-17 - see pt instructions    Consulted and Agree with Plan of Care  Patient        Patient will benefit from skilled therapeutic intervention in order to improve the following deficits and impairments:  Abnormal gait, Decreased activity tolerance, Decreased balance, Decreased mobility, Decreased knowledge of use of DME, Decreased endurance, Decreased strength, Impaired sensation, Impaired UE functional use, Impaired tone  Visit Diagnosis: Muscle weakness (generalized)  Other abnormalities of gait and mobility     Problem List Patient Active Problem List   Diagnosis Date Noted  . Hypotension due to drugs   . Labile blood pressure   . Benign prostatic hyperplasia   . Hemiparesis affecting left side as late effect of stroke ([place]   . Basal ganglia infarction ([facility] 06/07/2017  . 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 ([place]   . Gait disturbance, post-stroke   . Hyperlipidemia   . Acute ischemic stroke ([place] - R basal ganglia s/p IV tPA 06/05/2017  . Prostate nodule 10/30/2016  . Essential hypertension 08/05/2016  . External hemorrhoids without complication 194/17/4081 . Melanosis of colon 11/23/2015  . Diverticulosis of colon without hemorrhage 11/23/2015  . Internal hemorrhoids 11/23/2015  . Essential hypertension, benign 09/14/2014    LRexanne Mano PT 07/24/2017, 9:52 AM  [facility] [address]Phone: [phone]  Fax:  [phone]  Name: Robert Sandoval MRN: [id number] Date of Birth: 1960/08/10

## 2017-07-24 NOTE — Therapy (Signed)
[facility] [address] [place], [name], [address] Phone: [phone]   Fax:  [phone]  Occupational Therapy Treatment  Patient Details  Name: Robert Sandoval MRN: [id number] Date of Birth: [birth year] Referring Provider: Delice Lesch MD   Encounter Date: 07/24/2017  OT End of Session - 07/24/17 0938    Visit Number  8    Number of Visits  17    Date for OT Re-Evaluation  08/20/17    Authorization Type  [employer] covered 100% 60 visit limit for each OT/PT    OT Start Time  0937    OT Stop Time  1017    OT Time Calculation (min)  40 min    Activity Tolerance  Patient tolerated treatment well    Behavior During Therapy  [facility] for tasks assessed/performed       Past Medical History:  Diagnosis Date  . Allergy   . BPH (benign prostatic hyperplasia)    followed by [facility]  . Hyperlipidemia   . Hypertension   . Low back pain radiating to left lower extremity    sees Dr. Tonita Cong  . Stroke [facility])     History reviewed. No pertinent surgical history.  There were no vitals filed for this visit.  Subjective Assessment - 07/24/17 0938    Subjective   I've been trying my best to do my exercises.  Coordination and fatigue are improved.  Pt reports continued fatigue with standing.  Able to stand and cook in kitchen for 35min prior to break    Pertinent History  R basal ganglia CVA 06/05/17; HTN, hyperlipidemia low back pain with left leg extremity radiculopathy    Patient Stated Goals  walk better, incr control in LUE, get back to work, improve endurance for IADLs    Currently in Pain?  No/denies           Gross motor coordination activities:  Ambulating while bouncing ball with LUE with min-mod difficulty, improved with repetition and min cueing for control/balance.  Ambulating while tossing 1 ball in each UE alternating with min difficulty/drops and min v.c. For control/balance.  Picking up blocks with gripper set  on level 3 (black spring) for sustained grip strength with mod difficulty, decr to level 2 for last 1/3 blocks due to difficulty/fatigue.  High level reaching LUE to place small pegs in pegboard for LUE functional use, endurance, and coordination. Pt had min drops LUE but copied peg design accurately    Placing [place] pegs in pegboard with tweezers for incr coordination with min difficulty.   UBE x 5 min. level 10 resistance for UB strength/endurance without rest (forward/backwards)                     OT Short Term Goals - 07/09/17 1208      OT SHORT TERM GOAL #1   Title  Pt will be independent with HEP for coordination.--check STGs 07/21/17    Time  4    Period  Weeks    Status  Achieved      OT SHORT TERM GOAL #2   Title  Pt will improve coordination for ADLs as shown by completing 9-hole peg test in less than 26sec.    Baseline  35.28sec    Time  4    Period  Weeks    Status  New      OT SHORT TERM GOAL #3   Title  Pt will improve L grip strength by  at least 6lbs for IADLs.      Baseline  57lbs    Time  4    Period  Weeks    Status  New      OT SHORT TERM GOAL #4   Title  Pt will perform dynamic standing activities/IADL tasks for at least 76min without LOB or rest break.    Time  4    Period  Weeks    Status  Achieved 07/09/17 for pt report        OT Long Term Goals - 06/21/17 1227      OT LONG TERM GOAL #1   Title  Pt will be independent with strengthening HEP.--check LTGs 08/21/17    Time  8    Period  Weeks    Status  New      OT LONG TERM GOAL #2   Title  Pt will perform targeted overhead reaching for at least 34min without rest breaks and only 2 drops or less.      Time  8    Period  Weeks    Status  New      OT LONG TERM GOAL #3   Title  Pt will improve L grip strength by at least 12lbs for IADLs.      Baseline  57lbs    Time  8    Period  Weeks    Status  New      OT LONG TERM GOAL #4   Title  Pt will perform dynamic standing  activities/IADL tasks for at least 37min without LOB or rest break (including lifting/carrying/reaching tasks).    Time  8    Period  Weeks    Status  New      OT LONG TERM GOAL #5   Title  Pt will perform all previous home maintenance tasks mod I.    Time  8    Period  Weeks    Status  New            Plan - 07/24/17 0940    Clinical Impression Statement  Pt continues to progress with strength/activity tolerance and coordination.  Improved gross motor control today with LUE.    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  Pt is unable to work and perform all previous IADLs currently due to deficits.    Occupational performance deficits (Please refer to evaluation for details):  ADL's;IADL's;Leisure    Rehab Potential  Good    OT Frequency  2x / week    OT Duration  8 weeks    OT Treatment/Interventions  Self-care/ADL training;Therapeutic exercise;Patient/family education;Neuromuscular education;Moist Heat;Fluidtherapy;Energy conservation;Therapist, nutritional;Therapeutic activities;Balance training;Manual Therapy;Passive range of motion;DME and/or AE instruction;Ultrasound;Cryotherapy    Plan  continue with coordination and activity tolerance    OT Home Exercise Plan  Education provided:  scapula strengthening HEP, coordination HEP, red putty HEP    Consulted and Agree with Plan of Care  Patient       Patient will benefit from skilled therapeutic intervention in order to improve the following deficits and impairments:  Decreased coordination, Decreased mobility, Impaired sensation, Decreased strength, Decreased endurance, Decreased activity tolerance, Decreased balance, Impaired UE functional use  Visit Diagnosis: Other lack of coordination  Muscle weakness (generalized)  Unsteadiness on feet  Other abnormalities of gait and  mobility  Ataxia    Problem List Patient Active Problem  List   Diagnosis Date Noted  . Hypotension due to drugs   . Labile blood pressure   . Benign prostatic hyperplasia   . Hemiparesis affecting left side as late effect of stroke ([place])   . Basal ganglia infarction ([place]) 06/07/2017  . 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 ([place])   . Gait disturbance, post-stroke   . Hyperlipidemia   . Acute ischemic stroke ([place]) - R basal ganglia s/p IV tPA 06/05/2017  . Prostate nodule 10/30/2016  . Essential hypertension 08/05/2016  . External hemorrhoids without complication 13/24/4010  . Melanosis of colon 11/23/2015  . Diverticulosis of colon without hemorrhage 11/23/2015  . Internal hemorrhoids 11/23/2015  . Essential hypertension, benign 09/14/2014    [facility] 07/24/2017, 10:17 AM  [facility] [address] [place], [name], [address] Phone: [phone]   Fax:  [phone]  Name: Kayan Blissett MRN: [id number] Date of Birth: July 03, 1960   Vianne Bulls, OTR/L [facility] [address] [place], [place]  [address] [phone] phone [phone] 07/24/17 10:17 AM

## 2017-07-24 NOTE — Patient Instructions (Addendum)
Access Code: [facility]  URL: [email or web address]  Date: 07/24/2017  Prepared by: Barry Brunner   Exercises  Seated Knee Flexion with Anchored Resistance - 10 reps - 3 sets - 3 hold - 1x daily - 7x weekly  Standing Hip Abduction - 20 reps - 2 sets - 3 seconds hold - 1x daily - 7x weekly  Walking March - 3 reps - 1 sets - 1x daily - 7x weekly  Narrow Stance with Eyes Closed and Head Rotation on Foam Pad - 3 reps - 1 sets - 30 hold - 1x daily - 7x weekly  Wide Stance with Eyes Closed and Head Rotation on Foam Pad - 10 reps - 1 sets - 1x daily - 7x weekly  Supine Piriformis Stretch with Foot on Ground - 3 reps - 1 sets - 30 hold - 1x daily - 7x weekly  Straight Leg Raise with External Rotation - 10 reps - 2-3 sets - 1x daily - 7x weekly  Standing Squat with Resisted Terminal Knee Extension - 20 reps - 2 sets - 3 hold - 1x daily - 7x weekly    Subtracted bil toe raise as single toe raise was added (not via MedBridge) last visit Added hooklying squeeze ball between knees with extend LLE for medial quads

## 2017-07-25 ENCOUNTER — Telehealth: Payer: Self-pay

## 2017-07-25 NOTE — Telephone Encounter (Signed)
Per Janett Billow NP, pts PCP is managing is short term disability form. Pt was put out of work till August from PCP. Form will not be done. Refund will be done if pt paid. Form given back to medical records.

## 2017-07-26 ENCOUNTER — Ambulatory Visit: Payer: Managed Care, Other (non HMO) | Admitting: Occupational Therapy

## 2017-07-26 ENCOUNTER — Ambulatory Visit: Payer: Managed Care, Other (non HMO) | Admitting: Physical Therapy

## 2017-07-26 ENCOUNTER — Encounter: Payer: Self-pay | Admitting: Family Medicine

## 2017-07-26 ENCOUNTER — Ambulatory Visit (INDEPENDENT_AMBULATORY_CARE_PROVIDER_SITE_OTHER): Payer: Managed Care, Other (non HMO) | Admitting: Family Medicine

## 2017-07-26 ENCOUNTER — Encounter: Payer: Self-pay | Admitting: Physical Therapy

## 2017-07-26 ENCOUNTER — Other Ambulatory Visit: Payer: Self-pay

## 2017-07-26 VITALS — BP 133/80 | HR 66 | Temp 97.8°F | Ht 68.0 in | Wt 163.8 lb

## 2017-07-26 DIAGNOSIS — R200 Anesthesia of skin: Secondary | ICD-10-CM | POA: Diagnosis not present

## 2017-07-26 DIAGNOSIS — M6281 Muscle weakness (generalized): Secondary | ICD-10-CM

## 2017-07-26 DIAGNOSIS — I10 Essential (primary) hypertension: Secondary | ICD-10-CM

## 2017-07-26 DIAGNOSIS — R2689 Other abnormalities of gait and mobility: Secondary | ICD-10-CM

## 2017-07-26 DIAGNOSIS — R2681 Unsteadiness on feet: Secondary | ICD-10-CM

## 2017-07-26 DIAGNOSIS — R278 Other lack of coordination: Secondary | ICD-10-CM

## 2017-07-26 DIAGNOSIS — E785 Hyperlipidemia, unspecified: Secondary | ICD-10-CM | POA: Diagnosis not present

## 2017-07-26 DIAGNOSIS — I69398 Other sequelae of cerebral infarction: Secondary | ICD-10-CM

## 2017-07-26 DIAGNOSIS — R270 Ataxia, unspecified: Secondary | ICD-10-CM

## 2017-07-26 DIAGNOSIS — R531 Weakness: Secondary | ICD-10-CM

## 2017-07-26 LAB — LIPID PANEL
CHOLESTEROL TOTAL: 128 mg/dL (ref 100–199)
Chol/HDL Ratio: 3.9 ratio (ref 0.0–5.0)
HDL: 33 mg/dL — AB (ref >39)
LDL Calculated: 68 mg/dL (ref 0–99)
Triglycerides: 133 mg/dL (ref 0–149)
VLDL CHOLESTEROL CAL: 27 mg/dL (ref 5–40)

## 2017-07-26 MED ORDER — LISINOPRIL 10 MG PO TABS: 10.0000 mg | ORAL_TABLET | Freq: Every day | ORAL | 1 refills | Status: DC

## 2017-07-26 NOTE — Therapy (Signed)
[facility] [address] [place], [name], [address] Phone: [phone]   Fax:  [phone]  Occupational Therapy Treatment  Patient Details  Name: Robert Sandoval MRN: [id number] Date of Birth: [birth year] Referring Provider: Delice Lesch MD   Encounter Date: 07/26/2017  OT End of Session - 07/26/17 0839    Visit Number  9    Number of Visits  17    Date for OT Re-Evaluation  08/20/17    Authorization Type  [employer] covered 100% 60 visit limit for each OT/PT    OT Start Time  0800    OT Stop Time  0843    OT Time Calculation (min)  43 min    Activity Tolerance  Patient tolerated treatment well    Behavior During Therapy  [facility] for tasks assessed/performed       Past Medical History:  Diagnosis Date  . Allergy   . BPH (benign prostatic hyperplasia)    followed by [facility]  . Hyperlipidemia   . Hypertension   . Low back pain radiating to left lower extremity    sees Dr. Tonita Cong  . Stroke [facility])     No past surgical history on file.  There were no vitals filed for this visit.  Subjective Assessment - 07/26/17 0804    Subjective   It's still a little numb in my fingers, and I still get tired quicker than normal    Pertinent History  R basal ganglia CVA 06/05/17; HTN, hyperlipidemia low back pain with left leg extremity radiculopathy    Patient Stated Goals  walk better, incr control in LUE, get back to work, improve endurance for IADLs    Currently in Pain?  No/denies none in UE's         [facility] OT Assessment - 07/26/17 0001      Coordination   Left 9 Hole Peg Test  30.56 sec      Hand Function   Left Hand Grip (lbs)  55 lbs               OT Treatments/Exercises (OP) - 07/26/17 0001      ADLs   ADL Comments  Re-assessed coordination and grip strength - strength about the same as initial eval, however coordination improved by 5 sec.       Exercises   Exercises  Hand      Shoulder  Exercises: ROM/Strengthening   UBE (Upper Arm Bike)  UBE x 10 min. level 10 resistance for UB strength/endurance (5 min. forward, 5 min. backwards)      Hand Exercises   Other Hand Exercises  Gripper set at level 3 resistance to pick up blocks Lt hand for sustained grip strength with increasing drops as pt fatigues. Pt however was able to finish task at level 3 resistance today (did not need to decr. resistance)      Fine Motor Coordination (Hand/Wrist)   Fine Motor Coordination  O'Connor pegs    O'Connor pegs  Pt placing O'Connor pegs in pegboard using tweezers Lt hand for coordination, control, and graded pressure with mod difficulty and drops. Pt appeared to have difficulty grading and changing pressure on tweezers when trying to place them.        Functional ambulation while performing gross motor coordination tasks including: tossing small ball and catching with Lt hand only, tossing b/t both hands, tossing medium sized ball with both hands and catching simultaneously, and dribbling large ball. Pt had most difficulty with  dribbling as timing and force would vary when hitting ball.          OT Short Term Goals - 07/26/17 0839      OT SHORT TERM GOAL #1   Title  Pt will be independent with HEP for coordination.--check STGs 07/21/17    Time  4    Period  Weeks    Status  Achieved      OT SHORT TERM GOAL #2   Title  Pt will improve coordination for ADLs as shown by completing 9-hole peg test in less than 26sec.    Baseline  35.28sec    Time  4    Period  Weeks    Status  On-going 07/26/17: 30.56 sec      OT SHORT TERM GOAL #3   Title  Pt will improve L grip strength by at least 6lbs for IADLs.      Baseline  57lbs    Time  4    Period  Weeks    Status  On-going      OT SHORT TERM GOAL #4   Title  Pt will perform dynamic standing activities/IADL tasks for at least 40min without LOB or rest break.    Time  4    Period  Weeks    Status  Achieved 07/09/17 for pt report         OT Long Term Goals - 06/21/17 1227      OT LONG TERM GOAL #1   Title  Pt will be independent with strengthening HEP.--check LTGs 08/21/17    Time  8    Period  Weeks    Status  New      OT LONG TERM GOAL #2   Title  Pt will perform targeted overhead reaching for at least 31min without rest breaks and only 2 drops or less.      Time  8    Period  Weeks    Status  New      OT LONG TERM GOAL #3   Title  Pt will improve L grip strength by at least 12lbs for IADLs.      Baseline  57lbs    Time  8    Period  Weeks    Status  New      OT LONG TERM GOAL #4   Title  Pt will perform dynamic standing activities/IADL tasks for at least 25min without LOB or rest break (including lifting/carrying/reaching tasks).    Time  8    Period  Weeks    Status  New      OT LONG TERM GOAL #5   Title  Pt will perform all previous home maintenance tasks mod I.    Time  8    Period  Weeks    Status  New            Plan - 07/26/17 6606    Clinical Impression Statement  Pt with improved coordination Lt hand, and overall increased endurance.     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  Pt is unable to work and perform all previous IADLs currently due to deficits.    Rehab Potential  Good    OT Frequency  2x / week    OT Duration  8 weeks    OT Treatment/Interventions  Self-care/ADL training;Therapeutic exercise;Patient/family education;Neuromuscular education;Moist Heat;Fluidtherapy;Energy conservation;Functional Mobility  Training;Therapeutic activities;Balance training;Manual Therapy;Passive range of motion;DME and/or AE instruction;Ultrasound;Cryotherapy    Plan  continue with coordination and activity tolerance    OT Home Exercise Plan  Education provided:  scapula strengthening HEP, coordination HEP, red putty HEP    Consulted and Agree with Plan of Care   Patient       Patient will benefit from skilled therapeutic intervention in order to improve the following deficits and impairments:  Decreased coordination, Decreased mobility, Impaired sensation, Decreased strength, Decreased endurance, Decreased activity tolerance, Decreased balance, Impaired UE functional use  Visit Diagnosis: Muscle weakness (generalized)  Other lack of coordination  Unsteadiness on feet  Ataxia    Problem List Patient Active Problem List   Diagnosis Date Noted  . Hypotension due to drugs   . Labile blood pressure   . Benign prostatic hyperplasia   . Hemiparesis affecting left side as late effect of stroke ([place])   . Basal ganglia infarction ([place]) 06/07/2017  . 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 ([place])   . Gait disturbance, post-stroke   . Hyperlipidemia   . Acute ischemic stroke ([place]) - R basal ganglia s/p IV tPA 06/05/2017  . Prostate nodule 10/30/2016  . Essential hypertension 08/05/2016  . External hemorrhoids without complication 28/36/6294  . Melanosis of colon 11/23/2015  . Diverticulosis of colon without hemorrhage 11/23/2015  . Internal hemorrhoids 11/23/2015  . Essential hypertension, benign 09/14/2014    Carey Bullocks, OTR/L 07/26/2017, 8:43 AM  [facility] [address] [place], [name], [address] Phone: [phone]   Fax:  [phone]  Name: Welton Bord MRN: [id number] Date of Birth: 1960-05-26

## 2017-07-26 NOTE — Patient Instructions (Addendum)
I'm glad to hear that your symptoms are improving.   I will recheck cholesterol levels today. Continue lipitor same dose for now.   No change in blood pressure meds for now, but if running over 130/80 let me know as we may want to improve that control further.    IF you received an x-ray today, you will receive an invoice from [facility]. Please contact [facility] at [phone] with questions or concerns regarding your invoice.   IF you received labwork today, you will receive an invoice from [place]. Please contact [facility] at [phone] with questions or concerns regarding your invoice.   Our billing staff will not be able to assist you with questions regarding bills from these companies.  You will be contacted with the lab results as soon as they are available. The fastest way to get your results is to activate your My Chart account. Instructions are located on the last page of this paperwork. If you have not heard from [place] regarding the results in 2 weeks, please contact this office.

## 2017-07-26 NOTE — Progress Notes (Signed)
Subjective:  By signing my name below, I, Robert Sandoval, attest that this documentation has been prepared under the direction and in the presence of [name], MD Electronically Signed: [name], ED Scribe 07/26/2017 at 11:06 AM.   Patient ID: Robert Sandoval, male    DOB: 1960-06-16, 57 y.o.   MRN: [id number]  Chief Complaint  Patient presents with  . Hyperlipidemia    1 m f/u   . Hypertension   HPI Robert Sandoval is a 57 y.o. male who presents to Primary Care at [facility] for f/u. Last seen 6/4. H/o R basal ganglia stroke. Improving with strength, PT/OT. BP was stable. Option of additional 5 mg lisinopril prn. Was on lipitor 20 mg qd, plan for recheck levels today. Now off of aspirin. - Pt is fasting at this visit. Pt continued on 10 mg lisinopril. Home BP reading of 125/80. Denies side-effects with Lipitor. He is still in PT/OT, has noticed improvement in strength. States he still has some numbness in his hands but overall he is doing well.  Patient Active Problem List   Diagnosis Date Noted  . Hypotension due to drugs   . Labile blood pressure   . Benign prostatic hyperplasia   . Hemiparesis affecting left side as late effect of stroke ([name])   . Basal ganglia infarction ([name]) 06/07/2017  . 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 Gait disturbance, post-stroke   . Hyperlipidemia   . Acute ischemic stroke ([name]) - R basal ganglia s/p IV tPA 06/05/2017  . Prostate nodule 10/30/2016  . Essential hypertension 08/05/2016  . External hemorrhoids without complication 03/47/4259  . Melanosis of colon 11/23/2015  . Diverticulosis of colon without hemorrhage 11/23/2015  . Internal hemorrhoids 11/23/2015  . Essential hypertension, benign 09/14/2014   Past Medical History:  Diagnosis Date  . Allergy   . BPH (benign prostatic hyperplasia)    followed by [facility]  . Hyperlipidemia   . Hypertension   . Low back pain radiating to  left lower extremity    sees Dr. Tonita Cong  . Stroke [facility])    No past surgical history on file. No Known Allergies Prior to Admission medications   Medication Sig Start Date End Date Taking? Authorizing Provider  acetaminophen (TYLENOL) 325 MG tablet Take 1-2 tablets (325-650 mg total) by mouth every 4 (four) hours as needed for mild pain. 06/13/17   Love, [name], PA-C  atorvastatin (LIPITOR) 20 MG tablet Take 1 tablet (20 mg total) by mouth daily at 6 PM. 07/17/17   [name], NP  clopidogrel (PLAVIX) 75 MG tablet Take 1 tablet (75 mg total) by mouth daily. 07/17/17   [name], NP  fluticasone (FLONASE) 50 MCG/ACT nasal spray Place 1 spray into both nostrils daily. 06/13/17   Love, [name], PA-C  gabapentin (NEURONTIN) 300 MG capsule Take 1 capsule (300 mg total) by mouth 3 (three) times daily. 06/07/17   [name], NP  lisinopril (PRINIVIL,ZESTRIL) 10 MG tablet Take 1 tablet (10 mg total) by mouth daily. 06/14/17   Love, [name], PA-C  loratadine (CLARITIN) 10 MG tablet Take 1 tablet (10 mg total) by mouth daily. 06/13/17   Love, [name], PA-C  polyethylene glycol (MIRALAX / GLYCOLAX) packet Take 17 g by mouth 2 (two) times daily. 06/13/17   Love, [name], PA-C  tamsulosin (FLOMAX) 0.4 MG CAPS capsule Take 0.4 mg by mouth daily.    [provider]  Social History   Socioeconomic History  . Marital status: Divorced    Spouse name: Not on file  . Number of children: Not on file  . Years of education: Not on file  . Highest education level: Not on file  Occupational History  . Not on file  Social Needs  . Financial resource strain: Not on file  . Food insecurity:    Worry: Not on file    Inability: Not on file  . Transportation needs:    Medical: Not on file    Non-medical: Not on file  Tobacco Use  . Smoking status: Never Smoker  . Smokeless tobacco: Never Used  Substance and Sexual Activity  . Alcohol use: Yes  . Drug use: No  . Sexual activity:  Not on file  Lifestyle  . Physical activity:    Days per week: Not on file    Minutes per session: Not on file  . Stress: Not on file  Relationships  . Social connections:    Talks on phone: Not on file    Gets together: Not on file    Attends religious service: Not on file    Active member of club or organization: Not on file    Attends meetings of clubs or organizations: Not on file    Relationship status: Not on file  . Intimate partner violence:    Fear of current or ex partner: Not on file    Emotionally abused: Not on file    Physically abused: Not on file    Forced sexual activity: Not on file  Other Topics Concern  . Not on file  Social History Narrative  . Not on file   Review of Systems  Constitutional: Negative for fatigue and unexpected weight change.  Eyes: Negative for visual disturbance.  Respiratory: Negative for cough, chest tightness and shortness of breath.   Cardiovascular: Negative for chest pain, palpitations and leg swelling.  Gastrointestinal: Negative for abdominal pain and blood in stool.  Neurological: Positive for numbness (improving). Negative for dizziness, light-headedness and headaches.      Objective:   Physical Exam  Constitutional: He is oriented to person, place, and time. He appears well-developed and well-nourished.  HENT:  Head: Normocephalic and atraumatic.  Eyes: Pupils are equal, round, and reactive to light. EOM are normal.  Neck: No JVD present. Carotid bruit is not present.  Cardiovascular: Normal rate, regular rhythm and normal heart sounds.  No murmur heard. Pulmonary/Chest: Effort normal and breath sounds normal. He has no rales.  Musculoskeletal: He exhibits no edema.  Neurological: He is alert and oriented to person, place, and time.  Skin: Skin is warm and dry.  Psychiatric: He has a normal mood and affect.  Vitals reviewed.  Vitals:   07/26/17 1016  BP: 133/80  Pulse: 66  Temp: 97.8 F (36.6 C)  TempSrc: Oral    SpO2: 97%  Weight: 163 lb 12.8 oz (74.3 kg)  Height: 5\' 8"  (1.727 m)      Assessment & Plan:   Robert Sandoval is a 57 y.o. male Hyperlipidemia, unspecified hyperlipidemia type - Plan: Lipid panel  -Tolerating current dose of Lipitor, check labs to determine if higher dosing recommended in light of previous CVA.  Essential hypertension - Plan: lisinopril (PRINIVIL,ZESTRIL) 10 MG tablet  -Borderline but stable BP.  Continue lisinopril 10 mg daily, but if he has persistent elevated readings over 130/80 at home, would consider slightly improved control.  Meds ordered this encounter  Medications  .  lisinopril (PRINIVIL,ZESTRIL) 10 MG tablet    Sig: Take 1 tablet (10 mg total) by mouth daily.    Dispense:  90 tablet    Refill:  1   Patient Instructions   I'm glad to hear that your symptoms are improving.   I will recheck cholesterol levels today. Continue lipitor same dose for now.   No change in blood pressure meds for now, but if running over 130/80 let me know as we may want to improve that control further.    IF you received an x-ray today, you will receive an invoice from [facility]. Please contact [facility] at [phone] with questions or concerns regarding your invoice.   IF you received labwork today, you will receive an invoice from [place]. Please contact [facility] at [phone] with questions or concerns regarding your invoice.   Our billing staff will not be able to assist you with questions regarding bills from these companies.  You will be contacted with the lab results as soon as they are available. The fastest way to get your results is to activate your My Chart account. Instructions are located on the last page of this paperwork. If you have not heard from [place] regarding the results in 2 weeks, please contact this office.       I personally performed the services described in this documentation, which was scribed in my presence. The recorded  information has been reviewed and considered for accuracy and completeness, addended by me as needed, and agree with information above.  Signed,   Merri Ray, MD Primary [facility] at [place].  07/26/17 2:51 PM

## 2017-07-27 NOTE — Therapy (Signed)
[facility] [address] [place], [name], [address] Phone: [phone]   Fax:  [phone]  Physical Therapy Treatment  Patient Details  Name: Robert Sandoval MRN: [id number] Date of Birth: 1961-01-15 Referring Provider: Delice Lesch MD   Encounter Date: 07/26/2017  PT End of Session - 07/26/17 1700    Visit Number  9    Number of Visits  17    Date for PT Re-Evaluation  08/24/17    Authorization - Visit Number  5    Authorization - Number of Visits  60    PT Start Time  0845    PT Stop Time  0928    PT Time Calculation (min)  43 min    Activity Tolerance  Patient tolerated treatment well;No increased pain    Behavior During Therapy  WFL for tasks assessed/performed       Past Medical History:  Diagnosis Date  . Allergy   . BPH (benign prostatic hyperplasia)    followed by [facility]  . Hyperlipidemia   . Hypertension   . Low back pain radiating to left lower extremity    sees Dr. Tonita Cong  . Stroke [facility])     History reviewed. No pertinent surgical history.  There were no vitals filed for this visit.  Subjective Assessment - 07/26/17 0842    Subjective  Sees doctor today and currently plan is to return to work at the end of August.     Pertinent History  R basal ganglia CVA 06/05/17; HTN, hyperlipidemia low back pain with left leg extremity radiculopathy     Patient Stated Goals  improve my leg strength and my walking    Currently in Pain?  Yes    Pain Score  4     Pain Location  Back    Pain Orientation  Left    Pain Descriptors / Indicators  Aching    Pain Type  Chronic pain    Pain Radiating Towards  into left LE    Pain Onset  More than a month ago    Pain Frequency  Intermittent          Treatment-  Pre-gait/Gait training- Emphasis on left hip and knee extension in weightbearing to minimize strain on low back when his LLE "gives" in stance as it fatigues. Patient able to maintain correct  position for up to 15 reps of pre-gait activities or 2 minutes of ambulation before fatigue sets in.   LE strengthening/coordination-use of compliant surfaces (including rockerboard) for controlling left knee without hyperextension, ankle stability, and overall postural control. As pt fatigues, his knee falls into more hip and knee flexion in stance and to reduce strain on low back, requires seated rest break to recover. Seated rest break x 2 during session. Lt plantarflexion strengthening to assist with push off during gait. Left heel cord stretching between sets               [facility] Adult PT Treatment/Exercise - 07/26/17 0923      Knee/Hip Exercises: Machines for Strengthening   Cybex Knee Extension  50# LLE only hold 5 sec in near extension x 15 reps             PT Education - 07/26/17 1700    Education Details  ideal posture for sitting/driving to reduce back/sciatic pain    Person(s) Educated  Patient    Methods  Explanation;Demonstration    Comprehension  Verbalized understanding;Returned demonstration  PT Short Term Goals - 07/24/17 0940      PT SHORT TERM GOAL #1   Title  Patient will be independent with HEP ([place] for all STGs 07/23/2017)    Baseline  7/9 with basic HEP, continue to update as he progresses    Time  4    Period  Weeks    Status  Achieved      PT SHORT TERM GOAL #2   Title  Patient will improve 5x sit to stand to <=9.0 seconds to demonstrate improved LE strength.    Baseline  7/9 11.3 sec (pt reports afraid to move faster due to anticipation of back/sciatic pain)    Time  4    Period  Weeks    Status  Not Met      PT SHORT TERM GOAL #3   Title  Patient will improve FGA to >=23/30 to demonstrate decreasing fall risk.     Baseline  7/9 28    Time  4    Period  Weeks    Status  Achieved      PT SHORT TERM GOAL #4   Title  Patient will ambulate up to 500 ft over unlevel outdoor surfaces, including up/down curbs with no assistive  device and supervision.    Baseline  7/9 modified independent (slower pace, gait deviations but no LOB)     Time  4    Period  Weeks    Status  Achieved        PT Long Term Goals - 07/24/17 3329      PT LONG TERM GOAL #1   Title  Patient will be independent with updated HEP and able to state plan for continued activity upon discharge from PT ([place] for all LTGs 08/20/2017)    Time  8    Period  Weeks    Status  New      PT LONG TERM GOAL #2   Title  Patient will improve FGA >=27/30 demonstrating a measurable improvement in his balance and gait.    Baseline  7/9 28/30    Time  8    Period  Weeks    Status  Achieved      PT LONG TERM GOAL #3   Title  Patient will ambulate >=1038f over level and unlevel outdoor terrain modified independent.     Time  8    Period  Weeks    Status  New            Plan - 07/26/17 1700    Clinical Impression Statement  Patient continues to have more sciatic/back pain in the morning, and is able to lessen pain with increased activity followed by stretches. Patient plans to return to work in August and educated on posture during driving. [place] frequently (which will help minimize his pain). Continued focus on LLE strength, control, and gait training. Patient continues to be highly motivated and benefit from continued PT.     Rehab Potential  Good    PT Frequency  2x / week    PT Duration  8 weeks    PT Treatment/Interventions  ADLs/Self Care Home Management;Electrical Stimulation;Gait training;DME Instruction;Stair training;Functional mobility training;Therapeutic activities;Therapeutic exercise;Balance training;Neuromuscular re-education;Manual techniques;Orthotic Fit/Training;Patient/family education;Passive range of motion    PT Next Visit Plan  continue to address LLE strengthening (quads, esp medial, hip extension (mindful of back  pain), and PF);  and decr. muscle endurance of Lt dorsiflexors & plantarflexors (begin treadmill walking to incr endurance)    PT Home Exercise Plan  updated HEP on 07-17-17 - see pt instructions    Consulted and Agree with Plan of Care  Patient       Patient will benefit from skilled therapeutic intervention in order to improve the following deficits and impairments:  Abnormal gait, Decreased activity tolerance, Decreased balance, Decreased mobility, Decreased knowledge of use of DME, Decreased endurance, Decreased strength, Impaired sensation, Impaired UE functional use, Impaired tone  Visit Diagnosis: Muscle weakness (generalized)  Other abnormalities of gait and mobility  Other lack of coordination     Problem List Patient Active Problem List   Diagnosis Date Noted  . Hypotension due to drugs   . Labile blood pressure   . Benign prostatic hyperplasia   . 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 Basal ganglia infarction ([place]) 06/07/2017  . 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 Gait disturbance, post-stroke   . Hyperlipidemia   . Acute ischemic stroke ([place]) - R basal ganglia s/p IV tPA 06/05/2017  . Prostate nodule 10/30/2016  . Essential hypertension 08/05/2016  . External hemorrhoids without complication 48/49/8651  . Melanosis of colon 11/23/2015  . Diverticulosis of colon without hemorrhage 11/23/2015  . Internal hemorrhoids 11/23/2015  . Essential hypertension, benign 09/14/2014    Rexanne Mano, PT 07/27/2017, 7:25 AM  [facility] [address] [place], [name], [address] Phone: [phone]   Fax:  [phone]  Name: Robert Sandoval MRN: [id number] Date of Birth: 1960/09/11

## 2017-07-30 ENCOUNTER — Ambulatory Visit: Payer: Managed Care, Other (non HMO) | Admitting: Occupational Therapy

## 2017-07-30 ENCOUNTER — Telehealth: Payer: Self-pay | Admitting: Physical Medicine & Rehabilitation

## 2017-07-30 ENCOUNTER — Ambulatory Visit: Payer: Managed Care, Other (non HMO) | Admitting: Physical Therapy

## 2017-07-30 ENCOUNTER — Encounter: Payer: Self-pay | Admitting: Occupational Therapy

## 2017-07-30 ENCOUNTER — Encounter: Payer: Self-pay | Admitting: Physical Therapy

## 2017-07-30 DIAGNOSIS — R270 Ataxia, unspecified: Secondary | ICD-10-CM

## 2017-07-30 DIAGNOSIS — M6281 Muscle weakness (generalized): Secondary | ICD-10-CM

## 2017-07-30 DIAGNOSIS — R2681 Unsteadiness on feet: Secondary | ICD-10-CM

## 2017-07-30 DIAGNOSIS — R2689 Other abnormalities of gait and mobility: Secondary | ICD-10-CM

## 2017-07-30 DIAGNOSIS — R278 Other lack of coordination: Secondary | ICD-10-CM

## 2017-07-30 NOTE — Patient Instructions (Signed)
Access Code: [facility]  URL: [email or web address]  Date: 07/30/2017  Prepared by: Barry Brunner   Exercises  Seated Knee Flexion with Anchored Resistance - 10 reps - 3 sets - 3 hold - 1x daily - 7x weekly  Standing Hip Abduction - 20 reps - 2 sets - 3 seconds hold - 1x daily - 7x weekly  Walking March - 3 reps - 1 sets - 1x daily - 7x weekly  Narrow Stance with Eyes Closed and Head Rotation on Foam Pad - 3 reps - 1 sets - 30 hold - 1x daily - 7x weekly  Wide Stance with Eyes Closed and Head Rotation on Foam Pad - 10 reps - 1 sets - 1x daily - 7x weekly  Supine Piriformis Stretch with Foot on Ground - 3 reps - 1 sets - 30 hold - 1x daily - 7x weekly  Straight Leg Raise with External Rotation - 10 reps - 2-3 sets - 1x daily - 7x weekly  Standing Squat with Resisted Terminal Knee Extension - 20 reps - 2 sets - 3 hold - 1x daily - 7x weekly

## 2017-07-30 NOTE — Therapy (Signed)
[facility] [address] [place], [name], [address] Phone: [phone]   Fax:  [phone]  Occupational Therapy Treatment  Patient Details  Name: Robert Sandoval MRN: [id number] Date of Birth: 1960-05-31 Referring Provider: Delice Lesch MD   Encounter Date: 07/30/2017  OT End of Session - 07/30/17 0934    Visit Number  10    Number of Visits  17    Date for OT Re-Evaluation  08/20/17    Authorization Type  [employer] covered 100% 60 visit limit for each OT/PT    OT Start Time  [phone]    OT Stop Time  1018    OT Time Calculation (min)  40 min    Activity Tolerance  Patient tolerated treatment well    Behavior During Therapy  [facility] for tasks assessed/performed       Past Medical History:  Diagnosis Date  . Allergy   . BPH (benign prostatic hyperplasia)    followed by [facility]  . Hyperlipidemia   . Hypertension   . Low back pain radiating to left lower extremity    sees Dr. Tonita Cong  . Stroke [facility])     History reviewed. No pertinent surgical history.  There were no vitals filed for this visit.  Subjective Assessment - 07/30/17 0934    Subjective   doing good    Pertinent History  R basal ganglia CVA 06/05/17; HTN, hyperlipidemia low back pain with left leg extremity radiculopathy    Patient Stated Goals  walk better, incr control in LUE, get back to work, improve endurance for IADLs    Currently in Pain?  No/denies         Placing pegs in grooved pegboard with min difficulty for in-hand manipulation.   UBE x 5 min. level 10 resistance for UB strength/endurance without rest (forward/backwards)  Quadruped Activities for incr core/scapular stability and activity tolerance:  Cat/cow positions, alternating UE lifts in forward flex and ext with scapular retraction, forward/backwards weight shifts   In sitting, shoulder flex to mid-level, chest press with scapular retraction, and diagonals to each side x10 each  with min cueing using BUEs with 3lb weighted ball.  In tall kneeling, functional reaching in diagonal pattern to place clothespins with 1-8lb resistance on vertical pole for incr activity tolerance.    Picking up blocks with gripper set on level 3 (black spring) for sustained grip strength with mod difficulty, decr to level 2 for last 1/3 blocks due to difficulty/fatigue.                       OT Short Term Goals - 07/26/17 0839      OT SHORT TERM GOAL #1   Title  Pt will be independent with HEP for coordination.--check STGs 07/21/17    Time  4    Period  Weeks    Status  Achieved      OT SHORT TERM GOAL #2   Title  Pt will improve coordination for ADLs as shown by completing 9-hole peg test in less than 26sec.    Baseline  35.28sec    Time  4    Period  Weeks    Status  On-going 07/26/17: 30.56 sec      OT SHORT TERM GOAL #3   Title  Pt will improve L grip strength by at least 6lbs for IADLs.      Baseline  57lbs    Time  4    Period  Weeks    Status  On-going      OT SHORT TERM GOAL #4   Title  Pt will perform dynamic standing activities/IADL tasks for at least 42min without LOB or rest break.    Time  4    Period  Weeks    Status  Achieved 07/09/17 for pt report        OT Long Term Goals - 06/21/17 1227      OT LONG TERM GOAL #1   Title  Pt will be independent with strengthening HEP.--check LTGs 08/21/17    Time  8    Period  Weeks    Status  New      OT LONG TERM GOAL #2   Title  Pt will perform targeted overhead reaching for at least 69min without rest breaks and only 2 drops or less.      Time  8    Period  Weeks    Status  New      OT LONG TERM GOAL #3   Title  Pt will improve L grip strength by at least 12lbs for IADLs.      Baseline  57lbs    Time  8    Period  Weeks    Status  New      OT LONG TERM GOAL #4   Title  Pt will perform dynamic standing activities/IADL tasks for at least 75min without LOB or rest break (including  lifting/carrying/reaching tasks).    Time  8    Period  Weeks    Status  New      OT LONG TERM GOAL #5   Title  Pt will perform all previous home maintenance tasks mod I.    Time  8    Period  Weeks    Status  New            Plan - 07/30/17 0934    Clinical Impression Statement  Pt with improved coordination Lt hand, and overall increased endurance.     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  Pt is unable to work and perform all previous IADLs currently due to deficits.    Rehab Potential  Good    OT Frequency  2x / week    OT Duration  8 weeks    OT Treatment/Interventions  Self-care/ADL training;Therapeutic exercise;Patient/family education;Neuromuscular education;Moist Heat;Fluidtherapy;Energy conservation;Therapist, nutritional;Therapeutic activities;Balance training;Manual Therapy;Passive range of motion;DME and/or AE instruction;Ultrasound;Cryotherapy    Plan  continue with coordination and activity tolerance    OT Home Exercise Plan  Education provided:  scapula strengthening HEP, coordination HEP, red putty HEP    Consulted and Agree with Plan of Care  Patient       Patient will benefit from skilled therapeutic intervention in order to improve the following deficits and impairments:  Decreased coordination, Decreased mobility, Impaired sensation, Decreased strength, Decreased endurance, Decreased activity tolerance, Decreased balance, Impaired UE functional use  Visit Diagnosis: Muscle weakness (generalized)  Unsteadiness on feet  Other lack of coordination  Ataxia  Other abnormalities of gait and mobility    Problem List Patient Active Problem List   Diagnosis Date Noted  . Hypotension due to drugs   . Labile blood pressure   . Benign prostatic hyperplasia   . Hemiparesis affecting left side as late effect of stroke  ([place])   . Basal ganglia infarction ([place])  06/07/2017  . 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 ([place])   . Gait disturbance, post-stroke   . Hyperlipidemia   . Acute ischemic stroke ([place]) - R basal ganglia s/p IV tPA 06/05/2017  . Prostate nodule 10/30/2016  . Essential hypertension 08/05/2016  . External hemorrhoids without complication 44/46/1901  . Melanosis of colon 11/23/2015  . Diverticulosis of colon without hemorrhage 11/23/2015  . Internal hemorrhoids 11/23/2015  . Essential hypertension, benign 09/14/2014    [facility] 07/30/2017, 11:29 AM  [facility] [address] [place], [name], [address] Phone: [phone]   Fax:  [phone]  Name: Robert Sandoval MRN: [id number] Date of Birth: 1960-06-03   Vianne Bulls, OTR/L [facility] [address] [place], [place]  [address] [phone] phone [phone] 07/30/17 11:29 AM

## 2017-07-30 NOTE — Therapy (Signed)
[facility] [address] [place], [name], [address] Phone: [phone]   Fax:  [phone]  Physical Therapy Treatment  Patient Details  Name: Robert Sandoval MRN: [id number] Date of Birth: 29-Nov-1960 Referring Provider: Delice Lesch MD   Encounter Date: 07/30/2017  PT End of Session - 07/30/17 2018    Visit Number  10    Number of Visits  17    Date for PT Re-Evaluation  08/24/17    Authorization Type  [employer] Managed    Authorization - Visit Number  10    Authorization - Number of Visits  60    PT Start Time  7253    PT Stop Time  0935    PT Time Calculation (min)  40 min    Activity Tolerance  Patient tolerated treatment well;No increased pain    Behavior During Therapy  [facility] for tasks assessed/performed       Past Medical History:  Diagnosis Date  . Allergy   . BPH (benign prostatic hyperplasia)    followed by [facility]  . Hyperlipidemia   . Hypertension   . Low back pain radiating to left lower extremity    sees Dr. Tonita Cong  . Stroke [facility])     History reviewed. No pertinent surgical history.  There were no vitals filed for this visit.  Subjective Assessment - 07/30/17 0857    Subjective  Having less pain in back today,     Pertinent History  R basal ganglia CVA 06/05/17; HTN, hyperlipidemia low back pain with left leg extremity radiculopathy     Patient Stated Goals  improve my leg strength and my walking    Currently in Pain?  Yes    Pain Score  3     Pain Location  Leg    Pain Orientation  Posterior;Proximal    Pain Descriptors / Indicators  Aching    Pain Type  Chronic pain    Pain Onset  More than a month ago    Pain Frequency  Intermittent                       [facility] Adult PT Treatment/Exercise - 07/30/17 0901      Knee/Hip Exercises: Machines for Strengthening   Cybex Knee Extension  60# bil LEs x 20; 60# LLE only x 10    Other Machine  leg press used for Lt only PF  30# x 10; x 15;        Pre-gait and Gait training-anterior-posterior weight-shifting with emphasis on hip extension and Lt knee control. Gait with vc for incr step length with pt able to maintain changes to his gait patttern). Treadmill x 4.5 minutes up to 1.2 mph with vc for LLE step length, heelstrike, and anterior progression of pelvis over BOS during gait.  Assessed bil LE length with no discrepancies noted.       PT Education - 07/30/17 2017    Education Details  updated HEP to increase LLE strength    Person(s) Educated  Patient    Methods  Explanation;Demonstration;Verbal cues;Handout    Comprehension  Need further instruction;Verbal cues required;Returned demonstration;Verbalized understanding       PT Short Term Goals - 07/24/17 0940      PT SHORT TERM GOAL #1   Title  Patient will be independent with HEP ([place] for all STGs 07/23/2017)    Baseline  7/9 with basic HEP, continue to update as he progresses  Time  4    Period  Weeks    Status  Achieved      PT SHORT TERM GOAL #2   Title  Patient will improve 5x sit to stand to <=9.0 seconds to demonstrate improved LE strength.    Baseline  7/9 11.3 sec (pt reports afraid to move faster due to anticipation of back/sciatic pain)    Time  4    Period  Weeks    Status  Not Met      PT SHORT TERM GOAL #3   Title  Patient will improve FGA to >=23/30 to demonstrate decreasing fall risk.     Baseline  7/9 28    Time  4    Period  Weeks    Status  Achieved      PT SHORT TERM GOAL #4   Title  Patient will ambulate up to 500 ft over unlevel outdoor surfaces, including up/down curbs with no assistive device and supervision.    Baseline  7/9 modified independent (slower pace, gait deviations but no LOB)     Time  4    Period  Weeks    Status  Achieved        PT Long Term Goals - 07/24/17 8832      PT LONG TERM GOAL #1   Title  Patient will be independent with updated HEP and able to state plan for continued activity  upon discharge from PT ([place] for all LTGs 08/20/2017)    Time  8    Period  Weeks    Status  New      PT LONG TERM GOAL #2   Title  Patient will improve FGA >=27/30 demonstrating a measurable improvement in his balance and gait.    Baseline  7/9 28/30    Time  8    Period  Weeks    Status  Achieved      PT LONG TERM GOAL #3   Title  Patient will ambulate >=1051f over level and unlevel outdoor terrain modified independent.     Time  8    Period  Weeks    Status  New            Plan - 07/30/17 2020    Clinical Impression Statement  Session focused on improving gait to reduce strain on back (to reduce back/sciatic pain) and reduce risk of falling. Strengthening LLE (hip, knee and ankle) emphasized to improve LLE stability in standing and gait. Patient with improving endurance and can continue to benefit from PT    Rehab Potential  Good    PT Frequency  2x / week    PT Duration  8 weeks    PT Treatment/Interventions  ADLs/Self Care Home Management;Electrical Stimulation;Gait training;DME Instruction;Stair training;Functional mobility training;Therapeutic activities;Therapeutic exercise;Balance training;Neuromuscular re-education;Manual techniques;Orthotic Fit/Training;Patient/family education;Passive range of motion    PT Next Visit Plan  continue to address LLE strengthening (quads, esp medial, hip extension (mindful of back pain), and PF);  and decr. muscle endurance of Lt dorsiflexors & plantarflexors (begin treadmill walking to incr endurance)    PT Home Exercise Plan  updated HEP on 07-17-17 - see pt instructions    Consulted and Agree with Plan of Care  Patient       Patient will benefit from skilled therapeutic intervention in order to improve the following deficits and impairments:  Abnormal gait, Decreased activity tolerance, Decreased balance, Decreased mobility, Decreased knowledge of use of DME, Decreased endurance, Decreased strength, Impaired sensation,  Impaired UE  functional use, Impaired tone  Visit Diagnosis: Muscle weakness (generalized)  Unsteadiness on feet  Other abnormalities of gait and mobility     Problem List Patient Active Problem List   Diagnosis Date Noted  . Hypotension due to drugs   . Labile blood pressure   . Benign prostatic hyperplasia   . Hemiparesis affecting left side as late effect of stroke ([place])   . Basal ganglia infarction ([place]) 06/07/2017  . 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 ([place])   . Gait disturbance, post-stroke   . Hyperlipidemia   . Acute ischemic stroke ([place]) - R basal ganglia s/p IV tPA 06/05/2017  . Prostate nodule 10/30/2016  . Essential hypertension 08/05/2016  . External hemorrhoids without complication 78/00/4471  . Melanosis of colon 11/23/2015  . Diverticulosis of colon without hemorrhage 11/23/2015  . Internal hemorrhoids 11/23/2015  . Essential hypertension, benign 09/14/2014    Galvin Proffer 07/30/2017, 8:26 PM  [facility] [address] [place], [name], [address] Phone: [phone]   Fax:  [phone]  Name: Robert Sandoval MRN: [id number] Date of Birth: [birth year]

## 2017-07-30 NOTE — Telephone Encounter (Signed)
New Message  Pt dropped off forms to be signed by provider and placed in providers box.  Pts documents labeled and instructions to fax once complete and to call to obtain copy once completed.

## 2017-08-02 ENCOUNTER — Ambulatory Visit: Payer: Managed Care, Other (non HMO) | Admitting: Occupational Therapy

## 2017-08-02 ENCOUNTER — Encounter: Payer: Self-pay | Admitting: Physical Therapy

## 2017-08-02 ENCOUNTER — Ambulatory Visit: Payer: Managed Care, Other (non HMO) | Admitting: Physical Therapy

## 2017-08-02 DIAGNOSIS — M6281 Muscle weakness (generalized): Secondary | ICD-10-CM | POA: Diagnosis not present

## 2017-08-02 DIAGNOSIS — R2689 Other abnormalities of gait and mobility: Secondary | ICD-10-CM

## 2017-08-02 DIAGNOSIS — R278 Other lack of coordination: Secondary | ICD-10-CM

## 2017-08-02 DIAGNOSIS — R2681 Unsteadiness on feet: Secondary | ICD-10-CM

## 2017-08-02 NOTE — Therapy (Signed)
[facility] [address] [place], [name], [address] Phone: [phone]   Fax:  [phone]  Occupational Therapy Treatment  Patient Details  Name: Robert Sandoval MRN: [id number] Date of Birth: 12/11/1960 Referring Provider: Delice Lesch MD   Encounter Date: 08/02/2017  OT End of Session - 08/02/17 1008    Visit Number  11    Number of Visits  17    Date for OT Re-Evaluation  08/20/17    Authorization Type  [employer] covered 100% 60 visit limit for each OT/PT    OT Start Time  0845    OT Stop Time  0930    OT Time Calculation (min)  45 min    Activity Tolerance  Patient tolerated treatment well    Behavior During Therapy  [facility] for tasks assessed/performed       Past Medical History:  Diagnosis Date  . Allergy   . BPH (benign prostatic hyperplasia)    followed by [facility]  . Hyperlipidemia   . Hypertension   . Low back pain radiating to left lower extremity    sees Dr. Tonita Cong  . Stroke [facility])     No past surgical history on file.  There were no vitals filed for this visit.  Subjective Assessment - 08/02/17 0917    Subjective   My sciatica bothers me in the morning, but once I get moving it starts feeling better    Pertinent History  R basal ganglia CVA 06/05/17; HTN, hyperlipidemia low back pain with left leg extremity radiculopathy    Patient Stated Goals  walk better, incr control in LUE, get back to work, improve endurance for IADLs    Currently in Pain?  No/denies                   OT Treatments/Exercises (OP) - 08/02/17 0001      Neurological Re-education Exercises   Other Exercises 1  Prone: scapula retraction x 10 reps with initial cues to prevent upper trap activation for scapula retraction/strengthening (clarified correct method as this is part of previously issued HEP). Prone: LUE over edge of mat for shoulder extension with 2 lb. weight, and rowing with 2 lb weight each x 10 reps for  posterior sh. girdle strengthening. Pt required min facilitation and mod cueing to prevent compensations for both these ex's    Other Exercises 2  Supine: small circumduction ex's with LUE in 90* sh. flexion holding 2 lb. weight for control and scapula stabalization - issued as HEP     Other Weight-Bearing Exercises 1  Wall push ups x 10 reps with mod v.'cs and tactile cues to not favor Rt side. However, with repetition could do with minimal compensation therefore issued as part of HEP.     Other Weight-Bearing Exercises 2  Prone: propped on elbows for scapula depression/strengthening with chest lift with noted winging LT scapula. Quadraped: cat/cow stretch, followed by A/P wt shifts with mod compensations and mod demo and v.c's to correct. Pt however could do cat/cow w/ little compensation therefore also issued as HEP      Fine Motor Coordination (Hand/Wrist)   Fine Motor Coordination  -- Grooved pegs    [place]'Connor pegs  Pt placing grooved key shaped pegs in pegboard using tweezers with mod difficulty/extra time, then removed with only slight difficulty.              OT Education - 08/02/17 5284    Education Details  Additional scapula  strengthening HEP    Person(s) Educated  Patient    Methods  Explanation;Demonstration;Verbal cues;Handout    Comprehension  Verbalized understanding;Returned demonstration;Verbal cues required       OT Short Term Goals - 07/26/17 0839      OT SHORT TERM GOAL #1   Title  Pt will be independent with HEP for coordination.--check STGs 07/21/17    Time  4    Period  Weeks    Status  Achieved      OT SHORT TERM GOAL #2   Title  Pt will improve coordination for ADLs as shown by completing 9-hole peg test in less than 26sec.    Baseline  35.28sec    Time  4    Period  Weeks    Status  On-going 07/26/17: 30.56 sec      OT SHORT TERM GOAL #3   Title  Pt will improve L grip strength by at least 6lbs for IADLs.      Baseline  57lbs    Time  4    Period   Weeks    Status  On-going      OT SHORT TERM GOAL #4   Title  Pt will perform dynamic standing activities/IADL tasks for at least 56min without LOB or rest break.    Time  4    Period  Weeks    Status  Achieved 07/09/17 for pt report        OT Long Term Goals - 06/21/17 1227      OT LONG TERM GOAL #1   Title  Pt will be independent with strengthening HEP.--check LTGs 08/21/17    Time  8    Period  Weeks    Status  New      OT LONG TERM GOAL #2   Title  Pt will perform targeted overhead reaching for at least 15min without rest breaks and only 2 drops or less.      Time  8    Period  Weeks    Status  New      OT LONG TERM GOAL #3   Title  Pt will improve L grip strength by at least 12lbs for IADLs.      Baseline  57lbs    Time  8    Period  Weeks    Status  New      OT LONG TERM GOAL #4   Title  Pt will perform dynamic standing activities/IADL tasks for at least 11min without LOB or rest break (including lifting/carrying/reaching tasks).    Time  8    Period  Weeks    Status  New      OT LONG TERM GOAL #5   Title  Pt will perform all previous home maintenance tasks mod I.    Time  8    Period  Weeks    Status  New            Plan - 08/02/17 1009    Clinical Impression Statement  Pt with noted weakness Lt scapula and posterior sh. girdle, with winging noted during certain wt bearing positions. 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  Pt is unable to work and perform all previous IADLs currently due to deficits.    Occupational performance deficits (  Please refer to evaluation for details):  ADL's;IADL's;Leisure    Rehab Potential  Good    OT Frequency  2x / week    OT Duration  8 weeks    OT Treatment/Interventions  Self-care/ADL training;Therapeutic  exercise;Patient/family education;Neuromuscular education;Moist Heat;Fluidtherapy;Energy conservation;Therapist, nutritional;Therapeutic activities;Balance training;Manual Therapy;Passive range of motion;DME and/or AE instruction;Ultrasound;Cryotherapy    Plan  continue wt bearing and scapula strengthening, coordination and grip strength    OT Home Exercise Plan  Education provided:  scapula strengthening HEP, coordination HEP, red putty HEP    Consulted and Agree with Plan of Care  Patient       Patient will benefit from skilled therapeutic intervention in order to improve the following deficits and impairments:  Decreased coordination, Decreased mobility, Impaired sensation, Decreased strength, Decreased endurance, Decreased activity tolerance, Decreased balance, Impaired UE functional use  Visit Diagnosis: Muscle weakness (generalized)  Other lack of coordination    Problem List Patient Active Problem List   Diagnosis Date Noted  . Hypotension due to drugs   . Labile blood pressure   . Benign prostatic hyperplasia   . Hemiparesis affecting left side as late effect of stroke ([place])   . Basal ganglia infarction ([place]) 06/07/2017  . 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 Gait disturbance, post-stroke   . Hyperlipidemia   . Acute ischemic stroke ([place]) - R basal ganglia s/p IV tPA 06/05/2017  . Prostate nodule 10/30/2016  . Essential hypertension 08/05/2016  . External hemorrhoids without complication 14/23/9532  . Melanosis of colon 11/23/2015  . Diverticulosis of colon without hemorrhage 11/23/2015  . Internal hemorrhoids 11/23/2015  . Essential hypertension, benign 09/14/2014    Carey Bullocks, OTR/L 08/02/2017, 10:34 AM  [facility] [address] [place], [name], [address] Phone: [phone]   Fax:  [phone]  Name: Robert Sandoval MRN: [id number] Date  of Birth: Dec 21, 1960

## 2017-08-02 NOTE — Patient Instructions (Signed)
Cat / Cow Flow    Inhale, press spine toward ceiling like a Halloween cat. Keeping strength in arms and abdominals, exhale to soften spine through neutral and into cow pose. Open chest and arch back. Initiate movement between cat and cow at tailbone, one vertebrae at a time. Repeat _10___ times.  Circle (Supine)    Lie on back, holding _2_ pound WEIGHT above chest in left hand. Move arm in small circles clockwise, then counterclockwise, Lt arm straight. Repeat 10__ times each way.  Do _2_ sets per day.   ELBOW: Extension / Chest Press (Wall)    With arms slightly wider than shoulders, gently lean body to wall. Push body away from wall by straightening elbows. Hold __2_ seconds. Keep shoulders down, do not tense neck. _10__ reps per set, __2_ sets per day

## 2017-08-03 NOTE — Therapy (Signed)
[facility] [address] [place], [name], [address] Phone: [phone]   Fax:  [phone]  Physical Therapy Treatment  Patient Details  Name: Robert Sandoval MRN: [id number] Date of Birth: 28-Jul-1960 Referring Provider: Delice Lesch MD   Encounter Date: 08/02/2017  PT End of Session - 08/02/17 0937    Visit Number  11    Number of Visits  17    Date for PT Re-Evaluation  08/24/17    Authorization Type  [employer] Managed    Authorization - Visit Number  11    Authorization - Number of Visits  60    PT Start Time  0933    PT Stop Time  1013    PT Time Calculation (min)  40 min    Activity Tolerance  Patient tolerated treatment well;No increased pain    Behavior During Therapy  WFL for tasks assessed/performed       Past Medical History:  Diagnosis Date  . Allergy   . BPH (benign prostatic hyperplasia)    followed by [facility]  . Hyperlipidemia   . Hypertension   . Low back pain radiating to left lower extremity    sees Dr. Tonita Cong  . Stroke [facility])     History reviewed. No pertinent surgical history.  There were no vitals filed for this visit.  Subjective Assessment - 08/02/17 0935    Subjective  No new complaints. Having some left sciatica pain today, however still reports doing the ex's. No falls.     Pertinent History  R basal ganglia CVA 06/05/17; HTN, hyperlipidemia low back pain with left leg extremity radiculopathy     Patient Stated Goals  improve my leg strength and my walking    Currently in Pain?  Yes    Pain Score  3     Pain Location  Leg    Pain Orientation  Left    Pain Descriptors / Indicators  Aching;Shooting    Pain Type  Chronic pain    Pain Radiating Towards  into left leg    Pain Onset  More than a month ago    Pain Frequency  Intermittent    Aggravating Factors   increased activity such as walking; immobility with sitting a long time    Pain Relieving Factors  stretching            08/02/17 0938  Neuro Re-ed   Neuro Re-ed Details  for strengthening/balance reaction training: floor<>rocker board ant/post direction<>8 inch step- reciprocal stepping fwd/bwd with march in air at the end x 8-10 reps each leg; floor<>balance board in lateral direction<>8 inch step- fwd/bwd reciprocal step with one foot to board, opposite foot to step and back down x 8-10 reps each side; on BOSU (blue bubble on top): standing in center had pt tap cone in front of BOSU, then tap floor behind BOSU, then return foot to BOSU. 10 reps each LE with light UE support on bars; on inverted BOSU: single leg stance in center with contralateral LE kicks fwd, lateral, then bwd x 5 reps each LE with light UE support on bars. cues needed on posture and ex form with all ex's.                               Knee/Hip Exercises: Aerobic  Tread Mill x5 minutes with bil UE support at 1.6 mph. cues on posture and step length.  PT Short Term Goals - 07/24/17 0940      PT SHORT TERM GOAL #1   Title  Patient will be independent with HEP ([place] for all STGs 07/23/2017)    Baseline  7/9 with basic HEP, continue to update as he progresses    Time  4    Period  Weeks    Status  Achieved      PT SHORT TERM GOAL #2   Title  Patient will improve 5x sit to stand to <=9.0 seconds to demonstrate improved LE strength.    Baseline  7/9 11.3 sec (pt reports afraid to move faster due to anticipation of back/sciatic pain)    Time  4    Period  Weeks    Status  Not Met      PT SHORT TERM GOAL #3   Title  Patient will improve FGA to >=23/30 to demonstrate decreasing fall risk.     Baseline  7/9 28    Time  4    Period  Weeks    Status  Achieved      PT SHORT TERM GOAL #4   Title  Patient will ambulate up to 500 ft over unlevel outdoor surfaces, including up/down curbs with no assistive device and supervision.    Baseline  7/9 modified independent (slower pace, gait deviations but no LOB)     Time  4     Period  Weeks    Status  Achieved        PT Long Term Goals - 07/24/17 9509      PT LONG TERM GOAL #1   Title  Patient will be independent with updated HEP and able to state plan for continued activity upon discharge from PT (Target for all LTGs 08/20/2017)    Time  8    Period  Weeks    Status  New      PT LONG TERM GOAL #2   Title  Patient will improve FGA >=27/30 demonstrating a measurable improvement in his balance and gait.    Baseline  7/9 28/30    Time  8    Period  Weeks    Status  Achieved      PT LONG TERM GOAL #3   Title  Patient will ambulate >=1063f over level and unlevel outdoor terrain modified independent.     Time  8    Period  Weeks    Status  New          08/02/17 03267 Plan  Clinical Impression Statement Today's skilled session continued to address activity tolerance and high level balance activities for improved balance reactions along with LE strengthening. Pt is making steady progress toward goals and should benefit from continued PT to progress toward unmet goals.   Pt will benefit from skilled therapeutic intervention in order to improve on the following deficits Abnormal gait;Decreased activity tolerance;Decreased balance;Decreased mobility;Decreased knowledge of use of DME;Decreased endurance;Decreased strength;Impaired sensation;Impaired UE functional use;Impaired tone  Rehab Potential Good  PT Frequency 2x / week  PT Duration 8 weeks  PT Treatment/Interventions ADLs/Self Care Home Management;Electrical Stimulation;Gait training;DME Instruction;Stair training;Functional mobility training;Therapeutic activities;Therapeutic exercise;Balance training;Neuromuscular re-education;Manual techniques;Orthotic Fit/Training;Patient/family education;Passive range of motion  PT Next Visit Plan continue to address LLE strengthening (quads, esp medial, hip extension (mindful of back pain), and PF);  and decr. muscle endurance of Lt dorsiflexors & plantarflexors  (begin treadmill walking to incr endurance)  PT Home Exercise Plan updated HEP on 07-17-17 - see pt  instructions  Consulted and Agree with Plan of Care Patient       Patient will benefit from skilled therapeutic intervention in order to improve the following deficits and impairments:  Abnormal gait, Decreased activity tolerance, Decreased balance, Decreased mobility, Decreased knowledge of use of DME, Decreased endurance, Decreased strength, Impaired sensation, Impaired UE functional use, Impaired tone  Visit Diagnosis: Unsteadiness on feet  Other abnormalities of gait and mobility     Problem List Patient Active Problem List   Diagnosis Date Noted  . Hypotension due to drugs   . Labile blood pressure   . Benign prostatic hyperplasia   . Hemiparesis affecting left side as late effect of stroke ([place])   . Basal ganglia infarction ([place]) 06/07/2017  . 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 ([place])   . Gait disturbance, post-stroke   . Hyperlipidemia   . Acute ischemic stroke ([place]) - R basal ganglia s/p IV tPA 06/05/2017  . Prostate nodule 10/30/2016  . Essential hypertension 08/05/2016  . External hemorrhoids without complication 00/52/5910  . Melanosis of colon 11/23/2015  . Diverticulosis of colon without hemorrhage 11/23/2015  . Internal hemorrhoids 11/23/2015  . Essential hypertension, benign 09/14/2014    Willow Ora, PTA, [facility] [address], [place], [place] [address] [phone] 08/04/17, 3:58 PM   Name: Robert Sandoval MRN: [id number] Date of Birth: March 25, 1960

## 2017-08-06 ENCOUNTER — Ambulatory Visit: Payer: Managed Care, Other (non HMO) | Admitting: Physical Therapy

## 2017-08-06 ENCOUNTER — Ambulatory Visit: Payer: Managed Care, Other (non HMO) | Admitting: Occupational Therapy

## 2017-08-07 DIAGNOSIS — M51369 Other intervertebral disc degeneration, lumbar region without mention of lumbar back pain or lower extremity pain: Secondary | ICD-10-CM | POA: Insufficient documentation

## 2017-08-07 DIAGNOSIS — M5136 Other intervertebral disc degeneration, lumbar region: Secondary | ICD-10-CM | POA: Insufficient documentation

## 2017-08-07 DIAGNOSIS — M5416 Radiculopathy, lumbar region: Secondary | ICD-10-CM | POA: Insufficient documentation

## 2017-08-09 ENCOUNTER — Encounter: Payer: Self-pay | Admitting: Physical Therapy

## 2017-08-09 ENCOUNTER — Encounter
Payer: Managed Care, Other (non HMO) | Attending: Physical Medicine & Rehabilitation | Admitting: Physical Medicine & Rehabilitation

## 2017-08-09 ENCOUNTER — Encounter: Payer: Self-pay | Admitting: Physical Medicine & Rehabilitation

## 2017-08-09 ENCOUNTER — Ambulatory Visit: Payer: Managed Care, Other (non HMO) | Admitting: Physical Therapy

## 2017-08-09 ENCOUNTER — Encounter: Payer: Managed Care, Other (non HMO) | Admitting: Physical Medicine & Rehabilitation

## 2017-08-09 ENCOUNTER — Ambulatory Visit: Payer: Managed Care, Other (non HMO) | Admitting: Occupational Therapy

## 2017-08-09 ENCOUNTER — Telehealth: Payer: Self-pay | Admitting: Physical Medicine & Rehabilitation

## 2017-08-09 VITALS — BP 107/75 | HR 93 | Ht 68.0 in | Wt 165.0 lb

## 2017-08-09 DIAGNOSIS — I69354 Hemiplegia and hemiparesis following cerebral infarction affecting left non-dominant side: Secondary | ICD-10-CM | POA: Diagnosis not present

## 2017-08-09 DIAGNOSIS — I639 Cerebral infarction, unspecified: Secondary | ICD-10-CM

## 2017-08-09 DIAGNOSIS — R2681 Unsteadiness on feet: Secondary | ICD-10-CM

## 2017-08-09 DIAGNOSIS — I10 Essential (primary) hypertension: Secondary | ICD-10-CM | POA: Insufficient documentation

## 2017-08-09 DIAGNOSIS — E785 Hyperlipidemia, unspecified: Secondary | ICD-10-CM | POA: Diagnosis not present

## 2017-08-09 DIAGNOSIS — R269 Unspecified abnormalities of gait and mobility: Secondary | ICD-10-CM

## 2017-08-09 DIAGNOSIS — M545 Low back pain: Secondary | ICD-10-CM | POA: Insufficient documentation

## 2017-08-09 DIAGNOSIS — R278 Other lack of coordination: Secondary | ICD-10-CM

## 2017-08-09 DIAGNOSIS — M6281 Muscle weakness (generalized): Secondary | ICD-10-CM | POA: Diagnosis not present

## 2017-08-09 DIAGNOSIS — G8929 Other chronic pain: Secondary | ICD-10-CM

## 2017-08-09 DIAGNOSIS — R2689 Other abnormalities of gait and mobility: Secondary | ICD-10-CM

## 2017-08-09 DIAGNOSIS — N400 Enlarged prostate without lower urinary tract symptoms: Secondary | ICD-10-CM | POA: Diagnosis not present

## 2017-08-09 DIAGNOSIS — M5442 Lumbago with sciatica, left side: Secondary | ICD-10-CM

## 2017-08-09 DIAGNOSIS — I69398 Other sequelae of cerebral infarction: Secondary | ICD-10-CM

## 2017-08-09 DIAGNOSIS — I6381 Other cerebral infarction due to occlusion or stenosis of small artery: Secondary | ICD-10-CM

## 2017-08-09 DIAGNOSIS — R5383 Other fatigue: Secondary | ICD-10-CM | POA: Insufficient documentation

## 2017-08-09 NOTE — Therapy (Signed)
[facility] [address] [place], [name], [address] Phone: [phone]   Fax:  [phone]  Occupational Therapy Treatment  Patient Details  Name: Robert Sandoval MRN: [id number] Date of Birth: 1960/10/24 Referring Provider: Delice Lesch MD   Encounter Date: 08/09/2017  OT End of Session - 08/09/17 0912    Visit Number  12    Number of Visits  17    Date for OT Re-Evaluation  08/20/17    Authorization Type  Aetna covered 100% 60 visit limit for each OT/PT    OT Start Time  0850    OT Stop Time  0929    OT Time Calculation (min)  39 min       Past Medical History:  Diagnosis Date  . Allergy   . BPH (benign prostatic hyperplasia)    followed by [facility]  . Hyperlipidemia   . Hypertension   . Low back pain radiating to left lower extremity    sees Dr. Tonita Cong  . Stroke [facility])     No past surgical history on file.  There were no vitals filed for this visit.  Subjective Assessment - 08/09/17 0850    Subjective   My sciatica is bothering me    Pertinent History  R basal ganglia CVA 06/05/17; HTN, hyperlipidemia low back pain with left leg extremity radiculopathy    Patient Stated Goals  walk better, incr control in LUE, get back to work, improve endurance for IADLs    Currently in Pain?  Yes    Pain Score  4     Pain Location  Leg    Pain Orientation  Left    Pain Descriptors / Indicators  Aching    Pain Type  Chronic pain    Pain Onset  More than a month ago    Pain Frequency  Intermittent    Aggravating Factors   standing    Pain Relieving Factors  sitting                 Treatment:   Prone: scapula retraction x 10 reps with initial cues to prevent upper trap activation for scapula retraction/strengthening. Prone: LUE over edge of mat for shoulder extension with 2 lb. weight, and rowing with 2 lb weight each x 10 reps for posterior sh. girdle strengthening. Pt required min facilitation and  min cueing to prevent compensations for both these ex's    Supine: small circumduction ex's with LUE in 90* sh. flexion holding 2 lb. weight for control and scapula stabalization - shoulder flexion closed chain holding a 4 lbs weight with    Wall push ups x 10 reps with min v.'cs and tactile cues to    . Quadraped: cat/cow stretch,min v.c   Arm bike x 6 mins level 4 for reciprocal  movement and conditioning.    Fine motor coordination activity to copy small peg design then remove small pegs with in hand manipulation using his LUE, only occaisional min difficulty             OT Short Term Goals - 07/26/17 0839      OT SHORT TERM GOAL #1   Title  Pt will be independent with HEP for coordination.--check STGs 07/21/17    Time  4    Period  Weeks    Status  Achieved      OT SHORT TERM GOAL #2   Title  Pt will improve coordination for ADLs as shown by completing  9-hole peg test in less than 26sec.    Baseline  35.28sec    Time  4    Period  Weeks    Status  On-going 07/26/17: 30.56 sec      OT SHORT TERM GOAL #3   Title  Pt will improve L grip strength by at least 6lbs for IADLs.      Baseline  57lbs    Time  4    Period  Weeks    Status  On-going      OT SHORT TERM GOAL #4   Title  Pt will perform dynamic standing activities/IADL tasks for at least 55min without LOB or rest break.    Time  4    Period  Weeks    Status  Achieved 07/09/17 for pt report        OT Long Term Goals - 08/09/17 0918      OT LONG TERM GOAL #1   Title  Pt will be independent with strengthening HEP.--check LTGs 08/21/17    Status  On-going      OT LONG TERM GOAL #2   Title  Pt will perform targeted overhead reaching for at least 75min without rest breaks and only 2 drops or less.      Status  On-going      OT LONG TERM GOAL #3   Title  Pt will improve L grip strength by at least 12lbs for IADLs.      Status  On-going      OT LONG TERM GOAL #4   Title  Pt will perform dynamic standing  activities/IADL tasks for at least 67min without LOB or rest break (including lifting/carrying/reaching tasks).    Status  On-going      OT LONG TERM GOAL #5   Title  Pt will perform all previous home maintenance tasks mod I.    Status  On-going            Plan - 08/09/17 0918    Clinical Impression Statement  Pt is progressing towards goals for UE strength,    Occupational performance deficits (Please refer to evaluation for details):  ADL's;IADL's;Leisure    Rehab Potential  Good    OT Frequency  2x / week    OT Duration  8 weeks    OT Treatment/Interventions  Self-care/ADL training;Therapeutic exercise;Patient/family education;Neuromuscular education;Moist Heat;Fluidtherapy;Energy conservation;Therapist, nutritional;Therapeutic activities;Balance training;Manual Therapy;Passive range of motion;DME and/or AE instruction;Ultrasound;Cryotherapy    Plan  continue wt bearing and scapula strengthening, coordination and grip strength    Consulted and Agree with Plan of Care  Patient       Patient will benefit from skilled therapeutic intervention in order to improve the following deficits and impairments:  Decreased coordination, Decreased mobility, Impaired sensation, Decreased strength, Decreased endurance, Decreased activity tolerance, Decreased balance, Impaired UE functional use  Visit Diagnosis: Muscle weakness (generalized)  Other lack of coordination    Problem List Patient Active Problem List   Diagnosis Date Noted  . Hypotension due to drugs   . Labile blood pressure   . Benign prostatic hyperplasia   . Hemiparesis affecting left side as late effect of stroke ([place])   . Basal ganglia infarction ([place]) 06/07/2017  . 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 ([place])   . Gait disturbance, post-stroke   . Hyperlipidemia   . Acute ischemic stroke ([place]) - R basal ganglia s/p IV tPA 06/05/2017  . Prostate nodule 10/30/2016  .  Essential hypertension  08/05/2016  . External hemorrhoids without complication 19/16/6060  . Melanosis of colon 11/23/2015  . Diverticulosis of colon without hemorrhage 11/23/2015  . Internal hemorrhoids 11/23/2015  . Essential hypertension, benign 09/14/2014    Sharonlee Nine 08/09/2017, 9:36 AM Theone Murdoch, OTR/L [phone] Phone: [phone] 9:36 AM 08/09/17 [facility] [address] [place], [name], [address] Phone: [phone]   Fax:  [phone]  Name: Mckale Haffey MRN: [id number] Date of Birth: 1960-06-24

## 2017-08-09 NOTE — Telephone Encounter (Signed)
New Message  Pt has Medical Record Release forms needing for his job due by 8.5.19 and to call him when ready for pick-up

## 2017-08-09 NOTE — Progress Notes (Signed)
Subjective:    Patient ID: Robert Sandoval, male    DOB: Aug 22, 1960, 57 y.o.   MRN: [id number]  HPI 57 year old right-handed male of [place] descent with history of hypertension, hyperlipidemia, low back pain with left leg extremity radiculopathy, presents for follow up for right basal ganglia/corona radiata and occipital infracts.  Last clinic visit 06/22/17.  Since that time, pt states he has been improving, but continues to have imbalance. Denies falls. Fatigue is improve.  He is still in outpatient therapies. Dysesthesias have improved.  He is driving on local roads. BP is controlled.  Pain Inventory Average Pain 5 Pain Right Now 3 My pain is na  In the last 24 hours, has pain interfered with the following? General activity 5 Relation with others 0 Enjoyment of life 2 What TIME of day is your pain at its worst? morning Sleep (in general) Fair  Pain is worse with: sitting Pain improves with: na Relief from Meds: 6  Mobility use a cane use a walker ability to climb steps?  yes do you drive?  no transfers alone  Function employed # of hrs/week n/a I need assistance with the following:  household duties and shopping  Neuro/Psych numbness  Prior Studies Any changes since last visit?  no  Physicians involved in your care Primary care Dr Carlota Raspberry   Family History  Problem Relation Age of Onset  . Hyperlipidemia Father   . Asthma Father    Social History   Socioeconomic History  . Marital status: Divorced    Spouse name: Not on file  . Number of children: Not on file  . Years of education: Not on file  . Highest education level: Not on file  Occupational History  . Not on file  Social Needs  . Financial resource strain: Not on file  . Food insecurity:    Worry: Not on file    Inability: Not on file  . Transportation needs:    Medical: Not on file    Non-medical: Not on file  Tobacco Use  . Smoking status: Never Smoker  . Smokeless tobacco: Never Used    Substance and Sexual Activity  . Alcohol use: Yes  . Drug use: No  . Sexual activity: Not on file  Lifestyle  . Physical activity:    Days per week: Not on file    Minutes per session: Not on file  . Stress: Not on file  Relationships  . Social connections:    Talks on phone: Not on file    Gets together: Not on file    Attends religious service: Not on file    Active member of club or organization: Not on file    Attends meetings of clubs or organizations: Not on file    Relationship status: Not on file  Other Topics Concern  . Not on file  Social History Narrative  . Not on file   No past surgical history on file. Past Medical History:  Diagnosis Date  . Allergy   . BPH (benign prostatic hyperplasia)    followed by [facility]  . Hyperlipidemia   . Hypertension   . Low back pain radiating to left lower extremity    sees Dr. Tonita Cong  . Stroke ([place])    BP 107/75   Pulse 93   Ht 5\' 8"  (1.727 m)   Wt 165 lb (74.8 kg)   SpO2 95%   BMI 25.09 kg/m   Opioid Risk Score:   Fall Risk Score:  `  1  Depression screen PHQ 2/9  Depression screen [facility] 2/9 07/26/2017 06/22/2017 06/19/2017 08/05/2016 07/13/2015 05/01/2015 11/05/2014  Decreased Interest 0 0 0 0 0 0 0  Down, Depressed, Hopeless 0 0 0 0 0 0 0  PHQ - 2 Score 0 0 0 0 0 0 0   Review of Systems  Constitutional: Negative.   HENT: Negative.   Eyes: Negative.   Respiratory: Negative.   Cardiovascular: Negative.   Gastrointestinal: Negative.   Endocrine: Negative.   Genitourinary: Negative.   Musculoskeletal: Positive for back pain and gait problem.  Skin: Negative.   Allergic/Immunologic: Negative.   Neurological: Positive for numbness.  Hematological: Negative.   Psychiatric/Behavioral: Negative.   All other systems reviewed and are negative.     Objective:   Physical Exam General: No acute distress. Well-developed. Well-nourished. HENT: Normocephalic. Atraumatic. Eyes: EOMI. No discharge. Heart: RRR. No  JVD. Lungs: Clear to auscultation, breathing unlabored Abdomen: Positive bowel sounds, nondistended Musculoskeletal: No edema or tenderness in extremities Neurologic: alert and oriented Motor:  4-4+/5 left deltoid bicep tricep grip  4/5 left hip flexor, knee extensor, ankle dorsiflexor (pain inhibition) Sensation diminished to light touch left foot Skin: warm and dry. Intact. Psych: Mood and affect are appropriate    Assessment & Plan:  57 year old right-handed male of [place] descent with history of hypertension, hyperlipidemia, low back pain with left leg extremity radiculopathy, presents for follow up for right basal ganglia/corona radiata and occipital infracts.  1.  Left hemiparesis  secondary to right basal ganglia/corona radiata and occipital infarcts  Fatigue and dysesthesias main complaints, both improving  Cont therapies  Follow up with Neurology  Now driving, still limited by fatigue  Does not want medication for dysesthesias  Will consider medication for fatigue if not improved on next   Disability forms filled out previously, new forms today  2. LBP/Pain Management:   Following up with Ortho  He has an MRI this week  3. Gait abnormality  Cont therapies  No longer requires assistive device

## 2017-08-10 NOTE — Therapy (Signed)
[facility] [address] [place], [name], [address] Phone: [phone]   Fax:  [phone]  Physical Therapy Treatment  Patient Details  Name: Robert Sandoval MRN: [id number] Date of Birth: Jan 20, 1960 Referring Provider: Delice Lesch MD   Encounter Date: 08/09/2017  PT End of Session - 08/09/17 0934    Visit Number  12    Number of Visits  17    Date for PT Re-Evaluation  08/24/17    Authorization Type  Aetna Managed    Authorization - Visit Number  12    Authorization - Number of Visits  60    PT Start Time  0932    PT Stop Time  1015    PT Time Calculation (min)  43 min    Equipment Utilized During Treatment  Gait belt    Activity Tolerance  Patient tolerated treatment well;No increased pain    Behavior During Therapy  WFL for tasks assessed/performed       Past Medical History:  Diagnosis Date  . Allergy   . BPH (benign prostatic hyperplasia)    followed by [facility]  . Hyperlipidemia   . Hypertension   . Low back pain radiating to left lower extremity    sees Dr. Tonita Cong  . Stroke [facility])     History reviewed. No pertinent surgical history.  There were no vitals filed for this visit.  Subjective Assessment - 08/09/17 0932    Subjective  Having sciatica pain today. Reports he is having a MRI on Saturday to see if he can get injection for it (Dr. Tonita Cong is following him for this). No falls.     Pertinent History  R basal ganglia CVA 06/05/17; HTN, hyperlipidemia low back pain with left leg extremity radiculopathy     Patient Stated Goals  improve my leg strength and my walking    Currently in Pain?  Yes    Pain Score  3     Pain Location  Leg    Pain Orientation  Left    Pain Type  Chronic pain    Pain Radiating Towards  into left leg    Pain Onset  More than a month ago    Aggravating Factors   sitting for a long time    Pain Relieving Factors  standind and moving around          [facility] Adult  PT Treatment/Exercise - 08/09/17 0935      High Level Balance   High Level Balance Comments  on both red mats with no UE support: feet together in squat position performing diagonal stepping fwd/bwd, feet together in squat position performing side stepping left<>right, then feet apart in squat position walking fwd/bwd. 3 laps each with min guard assist and cues on ex form/technique. seated rest breaks between each activity due to LE fatigue.       Neuro Re-ed    Neuro Re-ed Details   for balance/strengthening: on inverted BOSU with feet wide- mini squats x 10 reps, with feet narrowed to hip width apart: rocking fwd/bwd, then rocking laterally with emphasis on tall posture and weight shifting. light touch on bars at times with min guard to min assist for balance; on BOSU with blue on top: single leg stance in center with contralateral LE performing heel tap fwd to foam bubble, then toe tap backward to floor, then onto BOSU. 2 sets fo 5 reps each leg in center with light UE support on bars, cues on  posture and weight shifting to assist with balance.                      Knee/Hip Exercises: Stretches   Other Knee/Hip Stretches  double knee to chest stretch for 30 sec's x 3 reps    Other Knee/Hip Stretches  lower trunk rotation stretch with arms at side, looking opposite direction of knee rotation for 30 sec's x 3 each way      Knee/Hip Exercises: Aerobic   Tread Mill  x5 minutes with bil UE support at 1.6 mph. cues on posture and step length.            PT Short Term Goals - 07/24/17 0940      PT SHORT TERM GOAL #1   Title  Patient will be independent with HEP (Target for all STGs 07/23/2017)    Baseline  7/9 with basic HEP, continue to update as he progresses    Time  4    Period  Weeks    Status  Achieved      PT SHORT TERM GOAL #2   Title  Patient will improve 5x sit to stand to <=9.0 seconds to demonstrate improved LE strength.    Baseline  7/9 11.3 sec (pt reports afraid to move  faster due to anticipation of back/sciatic pain)    Time  4    Period  Weeks    Status  Not Met      PT SHORT TERM GOAL #3   Title  Patient will improve FGA to >=23/30 to demonstrate decreasing fall risk.     Baseline  7/9 28    Time  4    Period  Weeks    Status  Achieved      PT SHORT TERM GOAL #4   Title  Patient will ambulate up to 500 ft over unlevel outdoor surfaces, including up/down curbs with no assistive device and supervision.    Baseline  7/9 modified independent (slower pace, gait deviations but no LOB)     Time  4    Period  Weeks    Status  Achieved        PT Long Term Goals - 07/24/17 5885      PT LONG TERM GOAL #1   Title  Patient will be independent with updated HEP and able to state plan for continued activity upon discharge from PT (Target for all LTGs 08/20/2017)    Time  8    Period  Weeks    Status  New      PT LONG TERM GOAL #2   Title  Patient will improve FGA >=27/30 demonstrating a measurable improvement in his balance and gait.    Baseline  7/9 28/30    Time  8    Period  Weeks    Status  Achieved      PT LONG TERM GOAL #3   Title  Patient will ambulate >=1032f over level and unlevel outdoor terrain modified independent.     Time  8    Period  Weeks    Status  New            Plan - 08/09/17 0935    Clinical Impression Statement  Today's skilled session continued to address LE strengthening and high level balance reactions after stretching to decrease sciatica pain he arrived with without any issues reported. Pt does continue to fatigue quickly needing rest breaks to recover. Pt also had slight  increase in back pain with higher level activites that resolved with resting. Pt is progressing and should benefit from continued PT to progress toward unmet goals .    Rehab Potential  Good    PT Frequency  2x / week    PT Duration  8 weeks    PT Treatment/Interventions  ADLs/Self Care Home Management;Electrical Stimulation;Gait training;DME  Instruction;Stair training;Functional mobility training;Therapeutic activities;Therapeutic exercise;Balance training;Neuromuscular re-education;Manual techniques;Orthotic Fit/Training;Patient/family education;Passive range of motion    PT Next Visit Plan  begin checking LTGs due on 08/20/17    PT Home Exercise Plan  updated HEP on 07-17-17 - see pt instructions    Consulted and Agree with Plan of Care  Patient       Patient will benefit from skilled therapeutic intervention in order to improve the following deficits and impairments:  Abnormal gait, Decreased activity tolerance, Decreased balance, Decreased mobility, Decreased knowledge of use of DME, Decreased endurance, Decreased strength, Impaired sensation, Impaired UE functional use, Impaired tone  Visit Diagnosis: Unsteadiness on feet  Other abnormalities of gait and mobility  Acute ischemic stroke (HCC) - R basal ganglia s/p IV tPA     Problem List Patient Active Problem List   Diagnosis Date Noted  . Hypotension due to drugs   . Labile blood pressure   . Benign prostatic hyperplasia   . Hemiparesis affecting left side as late effect of stroke ([place])   . Basal ganglia infarction ([place]) 06/07/2017  . 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 ([place])   . Gait disturbance, post-stroke   . Hyperlipidemia   . Acute ischemic stroke ([place]) - R basal ganglia s/p IV tPA 06/05/2017  . Prostate nodule 10/30/2016  . Essential hypertension 08/05/2016  . External hemorrhoids without complication 16/94/5038  . Melanosis of colon 11/23/2015  . Diverticulosis of colon without hemorrhage 11/23/2015  . Internal hemorrhoids 11/23/2015  . Essential hypertension, benign 09/14/2014    Willow Ora, PTA, [facility] [address], [place], [place] [address] [phone] 08/10/17, 12:47 PM   Name: Robert Sandoval MRN: [id number] Date of Birth: Nov 29, 1960

## 2017-08-13 ENCOUNTER — Encounter: Payer: Self-pay | Admitting: Physical Therapy

## 2017-08-13 ENCOUNTER — Ambulatory Visit: Payer: Managed Care, Other (non HMO) | Admitting: Occupational Therapy

## 2017-08-13 ENCOUNTER — Ambulatory Visit: Payer: Managed Care, Other (non HMO) | Admitting: Physical Therapy

## 2017-08-13 DIAGNOSIS — M6281 Muscle weakness (generalized): Secondary | ICD-10-CM

## 2017-08-13 DIAGNOSIS — R2681 Unsteadiness on feet: Secondary | ICD-10-CM

## 2017-08-13 DIAGNOSIS — R2689 Other abnormalities of gait and mobility: Secondary | ICD-10-CM

## 2017-08-13 DIAGNOSIS — R278 Other lack of coordination: Secondary | ICD-10-CM

## 2017-08-13 NOTE — Therapy (Signed)
[facility] [address] [place], [name], [address] Phone: [phone]   Fax:  [phone]  Occupational Therapy Treatment  Patient Details  Name: Robert Sandoval MRN: [id number] Date of Birth: [birth year] Referring Provider: Delice Lesch MD   Encounter Date: 08/13/2017  OT End of Session - 08/13/17 0929    Visit Number  13    Number of Visits  17    Date for OT Re-Evaluation  08/20/17    Authorization Type  Aetna covered 100% 60 visit limit for each OT/PT    OT Start Time  0845    OT Stop Time  0930    OT Time Calculation (min)  45 min    Activity Tolerance  Patient tolerated treatment well    Behavior During Therapy  [facility] for tasks assessed/performed       Past Medical History:  Diagnosis Date  . Allergy   . BPH (benign prostatic hyperplasia)    followed by [facility]  . Hyperlipidemia   . Hypertension   . Low back pain radiating to left lower extremity    sees Dr. Tonita Cong  . Stroke [facility])     No past surgical history on file.  There were no vitals filed for this visit.  Subjective Assessment - 08/13/17 0907    Pertinent History  R basal ganglia CVA 06/05/17; HTN, hyperlipidemia low back pain with left leg extremity radiculopathy    Patient Stated Goals  walk better, incr control in LUE, get back to work, improve endurance for IADLs    Currently in Pain?  Yes    Pain Score  4     Pain Location  -- sciatica    Pain Orientation  Left    Pain Descriptors / Indicators  Sharp    Pain Type  Chronic pain;Neuropathic pain    Pain Onset  More than a month ago    Pain Frequency  Intermittent    Aggravating Factors   prone on elbows    Pain Relieving Factors  standing and moving around         [facility] OT Assessment - 08/13/17 0001      Coordination   Left 9 Hole Peg Test  26.63 sec      Hand Function   Left Hand Grip (lbs)  60 lbs               OT Treatments/Exercises (OP) - 08/13/17 0001       ADLs   ADL Comments  Began assessing goals in prep for d/c  - see goal section     Neurological Re-education Exercises   Other Exercises 1  Prone: scapula retraction x 10 reps w/o compensations. Prone with LUE over edge of mat for sh. ext w/ 2 lb. weight w/ only min compensations and cues to correct (not to activate movement w/ upper traps). Followed by rowing for scapula retraction w/ mod compensations and required tactile cues to prevent.     Other Weight-Bearing Exercises 1  Wall push ups x 10 reps with min v.'cs and tactile cues to not favor Rt side.     Other Weight-Bearing Exercises 2  Prone: propped on elbows for scapula depression/strengthening with chest lift with noted winging LT scapula. Quadraped: cat/cow stretch.       Functional Reaching Activities   Mid Level  Mid to high level reaching to place all clothespins on antenna, then remove x 2 for 10 min. w/o rest  OT Short Term Goals - 08/13/17 0929      OT SHORT TERM GOAL #1   Title  Pt will be independent with HEP for coordination.--check STGs 07/21/17    Time  4    Period  Weeks    Status  Achieved      OT SHORT TERM GOAL #2   Title  Pt will improve coordination for ADLs as shown by completing 9-hole peg test in less than 26sec.    Baseline  35.28sec    Time  4    Period  Weeks    Status  Achieved 07/26/17: 30.56 sec, 08/13/17: 26.63      OT SHORT TERM GOAL #3   Title  Pt will improve L grip strength by at least 6lbs for IADLs.      Baseline  57lbs    Time  4    Period  Weeks    Status  Not Met 08/13/17: 60 lbs (Rt = 75)      OT SHORT TERM GOAL #4   Title  Pt will perform dynamic standing activities/IADL tasks for at least 73mn without LOB or rest break.    Time  4    Period  Weeks    Status  Achieved 07/09/17 for pt report        OT Long Term Goals - 08/13/17 0930      OT LONG TERM GOAL #1   Title  Pt will be independent with strengthening HEP.--check LTGs 08/21/17    Status  Achieved       OT LONG TERM GOAL #2   Title  Pt will perform targeted overhead reaching for at least 143m without rest breaks and only 2 drops or less.      Status  Achieved      OT LONG TERM GOAL #3   Title  Pt will improve L grip strength by at least 12lbs for IADLs.      Status  Not Met      OT LONG TERM GOAL #4   Title  Pt will perform dynamic standing activities/IADL tasks for at least 3593mwithout LOB or rest break (including lifting/carrying/reaching tasks).    Status  On-going      OT LONG TERM GOAL #5   Title  Pt will perform all previous home maintenance tasks mod I.    Status  Achieved            Plan - 08/13/17 0930    Clinical Impression Statement  Pt has met most LTG's at this time and has improved UE strength/endurance. 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  Pt is unable to work and perform all previous IADLs currently due to deficits.    Occupational performance deficits (Please refer to evaluation for details):  ADL's;IADL's;Leisure    Rehab Potential  Good    OT Frequency  2x / week    OT Duration  8 weeks    OT Treatment/Interventions  Self-care/ADL training;Therapeutic exercise;Patient/family education;Neuromuscular education;Moist Heat;Fluidtherapy;Energy conservation;FunTherapist, nutritionalerapeutic activities;Balance training;Manual Therapy;Passive range of motion;DME and/or AE instruction;Ultrasound;Cryotherapy    Plan  check remaining LTG and d/c next session    OT Home Exercise Plan  Education provided:  scapula strengthening HEP, coordination HEP, red putty HEP    Consulted and Agree with Plan of Care  Patient  Patient will benefit from skilled therapeutic intervention in order to improve the following deficits and impairments:  Decreased coordination, Decreased mobility,  Impaired sensation, Decreased strength, Decreased endurance, Decreased activity tolerance, Decreased balance, Impaired UE functional use  Visit Diagnosis: Muscle weakness (generalized)  Other lack of coordination    Problem List Patient Active Problem List   Diagnosis Date Noted  . Hypotension due to drugs   . Labile blood pressure   . Benign prostatic hyperplasia   . Hemiparesis affecting left side as late effect of stroke ([place])   . Basal ganglia infarction ([place]) 06/07/2017  . 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 ([place])   . Gait disturbance, post-stroke   . Hyperlipidemia   . Acute ischemic stroke ([place]) - R basal ganglia s/p IV tPA 06/05/2017  . Prostate nodule 10/30/2016  . Essential hypertension 08/05/2016  . External hemorrhoids without complication 23/20/0941  . Melanosis of colon 11/23/2015  . Diverticulosis of colon without hemorrhage 11/23/2015  . Internal hemorrhoids 11/23/2015  . Essential hypertension, benign 09/14/2014    Carey Bullocks, OTR/L 08/13/2017, 9:32 AM  [facility] [address] [place], [name], [address] Phone: [phone]   Fax:  [phone]  Name: Robert Sandoval MRN: [id number] Date of Birth: 1960/02/03

## 2017-08-14 NOTE — Therapy (Signed)
[facility] [address] [place], [name], [address] Phone: [phone]   Fax:  [phone]  Physical Therapy Treatment  Patient Details  Name: Robert Sandoval MRN: [id number] Date of Birth: [birth year] Referring Provider: Delice Lesch MD   Encounter Date: 08/13/2017  PT End of Session - 08/13/17 1752    Visit Number  13    Number of Visits  17    Date for PT Re-Evaluation  08/24/17    Authorization Type  Aetna Managed    Authorization - Visit Number  13    Authorization - Number of Visits  60    PT Start Time  8938 pt was in bathroom before PT started    PT Stop Time  1015    PT Time Calculation (min)  40 min    Equipment Utilized During Treatment  Gait belt    Activity Tolerance  Patient tolerated treatment well;No increased pain    Behavior During Therapy  WFL for tasks assessed/performed       Past Medical History:  Diagnosis Date  . Allergy   . BPH (benign prostatic hyperplasia)    followed by [facility]  . Hyperlipidemia   . Hypertension   . Low back pain radiating to left lower extremity    sees Dr. Tonita Cong  . Stroke [facility])     History reviewed. No pertinent surgical history.  There were no vitals filed for this visit.  Subjective Assessment - 08/13/17 1751    Subjective  No new complaints. Back is okay today, no pain currently. See's ortho MD on 8/6/9 to get MRI results.     Pertinent History  R basal ganglia CVA 06/05/17; HTN, hyperlipidemia low back pain with left leg extremity radiculopathy     Patient Stated Goals  improve my leg strength and my walking    Currently in Pain?  No/denies    Pain Score  0-No pain         [facility] PT Assessment - 08/13/17 1754      Ambulation/Gait   Ambulation/Gait  Yes    Ambulation/Gait Assistance  6: Modified independent (Device/Increase time)    Ambulation Distance (Feet)  1000 Feet    Assistive device  None    Gait Pattern  Within Functional  Limits;Antalgic    Stairs  Yes    Stairs Assistance  7: Independent    Stair Management Technique  No rails;Alternating pattern;Forwards    Number of Stairs  4    Height of Stairs  6      Functional Gait  Assessment   Gait assessed   Yes    Gait Level Surface  Walks 20 ft in less than 5.5 sec, no assistive devices, good speed, no evidence for imbalance, normal gait pattern, deviates no more than 6 in outside of the 12 in walkway width.    Change in Gait Speed  Able to smoothly change walking speed without loss of balance or gait deviation. Deviate no more than 6 in outside of the 12 in walkway width.    Gait with Horizontal Head Turns  Performs head turns smoothly with no change in gait. Deviates no more than 6 in outside 12 in walkway width    Gait with Vertical Head Turns  Performs head turns with no change in gait. Deviates no more than 6 in outside 12 in walkway width.    Gait and Pivot Turn  Pivot turns safely within 3 sec and stops quickly with no  loss of balance.    Step Over Obstacle  Is able to step over 2 stacked shoe boxes taped together (9 in total height) without changing gait speed. No evidence of imbalance.    Gait with Narrow Base of Support  Is able to ambulate for 10 steps heel to toe with no staggering.    Gait with Eyes Closed  Walks 20 ft, no assistive devices, good speed, no evidence of imbalance, normal gait pattern, deviates no more than 6 in outside 12 in walkway width. Ambulates 20 ft in less than 7 sec.    Ambulating Backwards  Walks 20 ft, uses assistive device, slower speed, mild gait deviations, deviates 6-10 in outside 12 in walkway width.    Steps  Alternating feet, no rail.    Total Score  29         [facility] Adult PT Treatment/Exercise - 08/13/17 1753      High Level Balance   High Level Balance Comments  on both red mats with no UE support: toe walking fwd/bwd, heel walking fwd/bwd, in squat position diagnoal stepping fwd/bwd, in squat position side stepping  left<>right. performed 3 laps each/each way with min guard assist for balance. cues on posture and ex form.                                Neuro Re-ed    Neuro Re-ed Details   for balance/strengthening: blue mat over ramp- alternating fwd lunges x 10 reps each leg with min guard to min assist for balance; with feet together facing up ramp: EC no head movements, progressing to EC head movements left<>right, then up<>down with min guard to min assist for balance. then performed the same tasks facing down the ramp with min guard to min assist for balance.           PT Short Term Goals - 07/24/17 0940      PT SHORT TERM GOAL #1   Title  Patient will be independent with HEP (Target for all STGs 07/23/2017)    Baseline  7/9 with basic HEP, continue to update as he progresses    Time  4    Period  Weeks    Status  Achieved      PT SHORT TERM GOAL #2   Title  Patient will improve 5x sit to stand to <=9.0 seconds to demonstrate improved LE strength.    Baseline  7/9 11.3 sec (pt reports afraid to move faster due to anticipation of back/sciatic pain)    Time  4    Period  Weeks    Status  Not Met      PT SHORT TERM GOAL #3   Title  Patient will improve FGA to >=23/30 to demonstrate decreasing fall risk.     Baseline  7/9 28    Time  4    Period  Weeks    Status  Achieved      PT SHORT TERM GOAL #4   Title  Patient will ambulate up to 500 ft over unlevel outdoor surfaces, including up/down curbs with no assistive device and supervision.    Baseline  7/9 modified independent (slower pace, gait deviations but no LOB)     Time  4    Period  Weeks    Status  Achieved        PT Long Term Goals - 08/13/17 8546  PT LONG TERM GOAL #1   Title  Patient will be independent with updated HEP and able to state plan for continued activity upon discharge from PT (Target for all LTGs 08/20/2017).    Time  8    Period  Weeks    Status  On-going      PT LONG TERM GOAL #2   Title  Patient will  improve FGA >=27/30 demonstrating a measurable improvement in his balance and gait.    Baseline  7/29 28/30    Status  Achieved      PT LONG TERM GOAL #3   Title  Patient will ambulate >=1038f over level and unlevel outdoor terrain modified independent.     Baseline  08/13/17: met today    Status  Achieved            Plan - 08/13/17 1752    Clinical Impression Statement  Today's skilled session continued to focus on high level balance activities and LE strengthening with no issues reported. Remainder of session began to check progress toward LTGs with 2 of 3 goals met. Will plan to check remaining goal at next visit.     Rehab Potential  Good    PT Frequency  2x / week    PT Duration  8 weeks    PT Treatment/Interventions  ADLs/Self Care Home Management;Electrical Stimulation;Gait training;DME Instruction;Stair training;Functional mobility training;Therapeutic activities;Therapeutic exercise;Balance training;Neuromuscular re-education;Manual techniques;Orthotic Fit/Training;Patient/family education;Passive range of motion    PT Next Visit Plan  check remaining goal- Review HEP and update as indicated for anticipated discharge at next visit    PT Home Exercise Plan  updated HEP on 07-17-17 - see pt instructions    Consulted and Agree with Plan of Care  Patient       Patient will benefit from skilled therapeutic intervention in order to improve the following deficits and impairments:  Abnormal gait, Decreased activity tolerance, Decreased balance, Decreased mobility, Decreased knowledge of use of DME, Decreased endurance, Decreased strength, Impaired sensation, Impaired UE functional use, Impaired tone  Visit Diagnosis: Muscle weakness (generalized)  Unsteadiness on feet  Other abnormalities of gait and mobility     Problem List Patient Active Problem List   Diagnosis Date Noted  . Hypotension due to drugs   . Labile blood pressure   . Benign prostatic hyperplasia   .  Hemiparesis affecting left side as late effect of stroke ([place]   . Basal ganglia infarction ([place] 06/07/2017  . 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 Gait disturbance, post-stroke   . Hyperlipidemia   . Acute ischemic stroke ([place] - R basal ganglia s/p IV tPA 06/05/2017  . Prostate nodule 10/30/2016  . Essential hypertension 08/05/2016  . External hemorrhoids without complication 100/34/9179 . Melanosis of colon 11/23/2015  . Diverticulosis of colon without hemorrhage 11/23/2015  . Internal hemorrhoids 11/23/2015  . Essential hypertension, benign 09/14/2014    KWillow Ora PTA, [facility] [phone]/30/19, 5:58 PM   Name: Robert VirdenMRN: [id number] of Birth: 1Dec 05, 1962

## 2017-08-16 ENCOUNTER — Encounter: Payer: Self-pay | Admitting: Physical Therapy

## 2017-08-16 ENCOUNTER — Ambulatory Visit: Payer: Managed Care, Other (non HMO) | Admitting: Occupational Therapy

## 2017-08-16 ENCOUNTER — Ambulatory Visit: Payer: Managed Care, Other (non HMO) | Attending: Family Medicine | Admitting: Physical Therapy

## 2017-08-16 ENCOUNTER — Encounter: Payer: Self-pay | Admitting: Occupational Therapy

## 2017-08-16 DIAGNOSIS — R278 Other lack of coordination: Secondary | ICD-10-CM | POA: Diagnosis present

## 2017-08-16 DIAGNOSIS — R2681 Unsteadiness on feet: Secondary | ICD-10-CM | POA: Diagnosis present

## 2017-08-16 DIAGNOSIS — R2689 Other abnormalities of gait and mobility: Secondary | ICD-10-CM

## 2017-08-16 DIAGNOSIS — M6281 Muscle weakness (generalized): Secondary | ICD-10-CM | POA: Diagnosis not present

## 2017-08-16 NOTE — Therapy (Signed)
[facility] [address] [place], [name], [address] Phone: [phone]   Fax:  [phone]  Occupational Therapy Treatment  Patient Details  Name: Robert Sandoval MRN: [id number] Date of Birth: Aug 22, 1960 Referring Provider: Delice Lesch MD   Encounter Date: 08/16/2017    Past Medical History:  Diagnosis Date  . Allergy   . BPH (benign prostatic hyperplasia)    followed by [facility]  . Hyperlipidemia   . Hypertension   . Low back pain radiating to left lower extremity    sees Dr. Tonita Cong  . Stroke [facility])     History reviewed. No pertinent surgical history.  There were no vitals filed for this visit.  Subjective Assessment - 08/16/17 0937    Subjective   denies pain    Pertinent History  R basal ganglia CVA 06/05/17; HTN, hyperlipidemia low back pain with left leg extremity radiculopathy    Patient Stated Goals  walk better, incr control in LUE, get back to work, improve endurance for IADLs    Currently in Pain?  No/denies                Treatment: Dynamic standing activities x >35 min for carrying 20 lbs, 100 ft x 4 with bilateral UE's, copying small peg design, retrieving items from overhead shelf including 3 lbs weight x 10 reps, no LOB and no reports of back pain. Arm bike x 5 mins level 5 for conditioning/ reciprocal movment. Pt agrees with plans for d/c.            OT Education - 08/16/17 0937    Education Details  Reviewed proper positioning for bilateral shoulder ext in prone to avoid arching back, pillows under hips    Person(s) Educated  Patient    Methods  Explanation;Demonstration;Verbal cues;Handout    Comprehension  Verbalized understanding;Returned demonstration;Verbal cues required       OT Short Term Goals - 08/13/17 0929      OT SHORT TERM GOAL #1   Title  Pt will be independent with HEP for coordination.--check STGs 07/21/17    Time  4    Period  Weeks    Status   Achieved      OT SHORT TERM GOAL #2   Title  Pt will improve coordination for ADLs as shown by completing 9-hole peg test in less than 26sec.    Baseline  35.28sec    Time  4    Period  Weeks    Status  Achieved 07/26/17: 30.56 sec, 08/13/17: 26.63      OT SHORT TERM GOAL #3   Title  Pt will improve L grip strength by at least 6lbs for IADLs.      Baseline  57lbs    Time  4    Period  Weeks    Status  Not Met 08/13/17: 60 lbs (Rt = 75)      OT SHORT TERM GOAL #4   Title  Pt will perform dynamic standing activities/IADL tasks for at least 30mn without LOB or rest break.    Time  4    Period  Weeks    Status  Achieved 07/09/17 for pt report        OT Long Term Goals - 08/13/17 0930      OT LONG TERM GOAL #1   Title  Pt will be independent with strengthening HEP.--check LTGs 08/21/17    Status  Achieved      OT LONG TERM GOAL #  2   Title  Pt will perform targeted overhead reaching for at least 43mn without rest breaks and only 2 drops or less.      Status  Achieved      OT LONG TERM GOAL #3   Title  Pt will improve L grip strength by at least 12lbs for IADLs.      Status  Not Met      OT LONG TERM GOAL #4   Title  Pt will perform dynamic standing activities/IADL tasks for at least 376m without LOB or rest break (including lifting/carrying/reaching tasks).    Status  achieved     OT LONG TERM GOAL #5   Title  Pt will perform all previous home maintenance tasks mod I.    Status  Achieved            Plan - 08/16/17 0953    Clinical Impression Statement  Pt demonstrates excellent progress towards goals, he agrees with plans for d/c.    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  Pt is unable to work and perform all previous IADLs currently due to deficits.    Occupational performance deficits (Please refer to evaluation for details):   ADL's;IADL's;Leisure    Rehab Potential  Good    OT Frequency  2x / week    OT Duration  8 weeks    OT Treatment/Interventions  Self-care/ADL training;Therapeutic exercise;Patient/family education;Neuromuscular education;Moist Heat;Fluidtherapy;Energy conservation;FuTherapist, nutritionalherapeutic activities;Balance training;Manual Therapy;Passive range of motion;DME and/or AE instruction;Ultrasound;Cryotherapy    Plan  d/c OT    OT Home Exercise Plan  Education provided:  scapula strengthening HEP, coordination HEP, red putty HEP    Consulted and Agree with Plan of Care  Patient      OCCUPATIONAL THERAPY DISCHARGE SUMMARY    Current functional level related to goals / functional outcomes: Pt made excellent overall progress. See above progress towards goals.   Remaining deficits: Mildly decreased grip strength, mild sensory impairment   Education / Equipment: Pt was educated regarding HEP. He returned demonstration.  Plan: Patient agrees to discharge.  Patient goals were partially met. Patient is being discharged due to being pleased with the current functional level.  ?????     Patient will benefit from skilled therapeutic intervention in order to improve the following deficits and impairments:  Decreased coordination, Decreased mobility, Impaired sensation, Decreased strength, Decreased endurance, Decreased activity tolerance, Decreased balance, Impaired UE functional use  Visit Diagnosis: Muscle weakness (generalized)  Other lack of coordination  Unsteadiness on feet  Other abnormalities of gait and mobility    Problem List Patient Active Problem List   Diagnosis Date Noted  . Hypotension due to drugs   . Labile blood pressure   . Benign prostatic hyperplasia   . Hemiparesis affecting left side as late effect of stroke ([facility] [place]  . Basal ganglia infarction (HCEveretts05/23/2019  . 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 (HCFollett   . Gait disturbance, post-stroke   . Hyperlipidemia   . Acute ischemic stroke (HCBrink[facility]- R basal ganglia s/p IV tPA 06/05/2017  . Prostate nodule 10/30/2016  . Essential hypertension 08/05/2016  . External hemorrhoids without complication 1198/33/8250. Melanosis of colon 11/23/2015  . Diverticulosis of colon without hemorrhage 11/23/2015  . Internal hemorrhoids 11/23/2015  . Essential hypertension, benign 09/14/2014    RINE,KATHRYN 08/16/2017, 10:02  AM Theone Murdoch, OTR/L [phone] Phone: [phone] 10:02 AM 08/16/17 [facility] [address] [place], [name], [address] Phone: [phone]   Fax:  [phone]  Name: Robert Sandoval MRN: [id number] Date of Birth: 1960-03-25

## 2017-08-16 NOTE — Patient Instructions (Signed)
Access Code: [facility]  URL: [email or web address]  Date: 08/16/2017  Prepared by: Barry Brunner   Exercises  Seated Knee Flexion with Anchored Resistance - 10 reps - 3 sets - 3 hold - 1x daily - 7x weekly  Standing Hip Abduction - 20 reps - 2 sets - 3 seconds hold - 1x daily - 7x weekly  Walking March - 3 reps - 1 sets - 1x daily - 7x weekly  Narrow Stance with Eyes Closed and Head Rotation on Foam Pad - 10 reps - 1 sets - 1x daily - 7x weekly  Supine Piriformis Stretch with Foot on Ground - 3 reps - 1 sets - 30 hold - 1x daily - 7x weekly  Straight Leg Raise with External Rotation - 10 reps - 3 sets - 1x daily - 7x weekly  Standing Squat with Resisted Terminal Knee Extension - 20 reps - 2 sets - 3 hold - 1x daily - 7x weekly

## 2017-08-16 NOTE — Therapy (Signed)
[facility] [address] [place], [name], [address] Phone: [phone]   Fax:  [phone]  Physical Therapy Treatment and Discharge Summary  Patient Details  Name: Robert Sandoval MRN: [id number] Date of Birth: 01-20-1960 Referring Provider: Delice Lesch MD   Encounter Date: 08/16/2017  PT End of Session - 08/16/17 0921    Visit Number  14    Number of Visits  17    Date for PT Re-Evaluation  08/24/17    Authorization Type  [employer] Managed    Authorization - Visit Number  13    Authorization - Number of Visits  60    PT Start Time  7371    PT Stop Time  0925    PT Time Calculation (min)  38 min    Activity Tolerance  Patient tolerated treatment well;No increased pain    Behavior During Therapy  WFL for tasks assessed/performed       Past Medical History:  Diagnosis Date  . Allergy   . BPH (benign prostatic hyperplasia)    followed by [facility]  . Hyperlipidemia   . Hypertension   . Low back pain radiating to left lower extremity    sees Dr. Tonita Cong  . Stroke [facility])     History reviewed. No pertinent surgical history.  There were no vitals filed for this visit.  Subjective Assessment - 08/16/17 0851    Subjective  No new complaints. Back is okay today, no pain currently. See's ortho MD on 8/6/9 to get MRI results.     Pertinent History  R basal ganglia CVA 06/05/17; HTN, hyperlipidemia low back pain with left leg extremity radiculopathy     Patient Stated Goals  improve my leg strength and my walking    Currently in Pain?  No/denies        Treatment- See Pt instructions for HEP. Patient completed up to 10 reps of each exercise and program updated as appropriate.  *In addition, pt mentioned his prone bil shoulder extension with back extension hurts his back "a little" Had pt put a pillow under his hips and he reported that reduced the pain significantly. Made OT aware as he would see OT next.    Elliptical x 4 min total (2 forward, 1 backward, 1 forward) for improving muscular endurance and strength of LEs.                        PT Education - 08/16/17 0919    Education Details  updated HEP for LLE strength and balance    Person(s) Educated  Patient    Methods  Explanation;Demonstration;Handout    Comprehension  Verbalized understanding;Returned demonstration       PT Short Term Goals - 07/24/17 0940      PT SHORT TERM GOAL #1   Title  Patient will be independent with HEP (Target for all STGs 07/23/2017)    Baseline  7/9 with basic HEP, continue to update as he progresses    Time  4    Period  Weeks    Status  Achieved      PT SHORT TERM GOAL #2   Title  Patient will improve 5x sit to stand to <=9.0 seconds to demonstrate improved LE strength.    Baseline  7/9 11.3 sec (pt reports afraid to move faster due to anticipation of back/sciatic pain)    Time  4    Period  Weeks    Status  Not Met      PT SHORT TERM GOAL #3   Title  Patient will improve FGA to >=23/30 to demonstrate decreasing fall risk.     Baseline  7/9 28    Time  4    Period  Weeks    Status  Achieved      PT SHORT TERM GOAL #4   Title  Patient will ambulate up to 500 ft over unlevel outdoor surfaces, including up/down curbs with no assistive device and supervision.    Baseline  7/9 modified independent (slower pace, gait deviations but no LOB)     Time  4    Period  Weeks    Status  Achieved        PT Long Term Goals - 08/16/17 7867      PT LONG TERM GOAL #1   Title  Patient will be independent with updated HEP and able to state plan for continued activity upon discharge from PT (Target for all LTGs 08/20/2017).    Time  8    Period  Weeks    Status  Achieved      PT LONG TERM GOAL #2   Title  Patient will improve FGA >=27/30 demonstrating a measurable improvement in his balance and gait.    Baseline  7/29 28/30    Status  Achieved      PT LONG TERM GOAL #3   Title   Patient will ambulate >=1039f over level and unlevel outdoor terrain modified independent.     Baseline  08/13/17: met today    Status  Achieved            Plan - 08/16/17 0923    Clinical Impression Statement  Session focused on review and updating of HEP to assure appropriate difficulty. Patient met 3 of 3 LTGs and is being discharged from PT with pt to continue to exercise on his own with hopeful clearance to return to work by end of the month. (He has been cleared to work 20 hrs/wk however his supervisor does not have authorization to have him work a part-time position (they have only FT).     Rehab Potential  Good    PT Frequency  2x / week    PT Duration  8 weeks    PT Treatment/Interventions  ADLs/Self Care Home Management;Electrical Stimulation;Gait training;DME Instruction;Stair training;Functional mobility training;Therapeutic activities;Therapeutic exercise;Balance training;Neuromuscular re-education;Manual techniques;Orthotic Fit/Training;Patient/family education;Passive range of motion    Consulted and Agree with Plan of Care  Patient       Patient will benefit from skilled therapeutic intervention in order to improve the following deficits and impairments:  Abnormal gait, Decreased activity tolerance, Decreased balance, Decreased mobility, Decreased knowledge of use of DME, Decreased endurance, Decreased strength, Impaired sensation, Impaired UE functional use, Impaired tone  Visit Diagnosis: Muscle weakness (generalized)  Other abnormalities of gait and mobility     Problem List Patient Active Problem List   Diagnosis Date Noted  . Hypotension due to drugs   . Labile blood pressure   . Benign prostatic hyperplasia   . Hemiparesis affecting left side as late effect of stroke ([facility]   . Basal ganglia infarction ([place] 06/07/2017  . 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 (HJackson[place]   . Gait disturbance, post-stroke   .  Hyperlipidemia   . Acute ischemic stroke (HG[place] - R basal ganglia s/p IV tPA 06/05/2017  . Prostate nodule 10/30/2016  . Essential hypertension 08/05/2016  . External  hemorrhoids without complication 95/36/9223  . Melanosis of colon 11/23/2015  . Diverticulosis of colon without hemorrhage 11/23/2015  . Internal hemorrhoids 11/23/2015  . Essential hypertension, benign 09/14/2014    Rexanne Mano, PT 08/16/2017, 12:23 PM  [facility] [address] [place], [name], [address] Phone: [phone]   Fax:  [phone]  Name: Robert Sandoval MRN: [id number] Date of Birth: [birth year]

## 2017-08-21 DIAGNOSIS — M48062 Spinal stenosis, lumbar region with neurogenic claudication: Secondary | ICD-10-CM | POA: Insufficient documentation

## 2017-08-21 DIAGNOSIS — M48061 Spinal stenosis, lumbar region without neurogenic claudication: Secondary | ICD-10-CM | POA: Insufficient documentation

## 2017-08-22 DIAGNOSIS — M545 Low back pain, unspecified: Secondary | ICD-10-CM | POA: Insufficient documentation

## 2017-08-22 DIAGNOSIS — Z8673 Personal history of transient ischemic attack (TIA), and cerebral infarction without residual deficits: Secondary | ICD-10-CM | POA: Insufficient documentation

## 2017-09-07 ENCOUNTER — Other Ambulatory Visit: Payer: Self-pay

## 2017-09-07 ENCOUNTER — Encounter: Payer: Self-pay | Admitting: Physical Medicine & Rehabilitation

## 2017-09-07 ENCOUNTER — Encounter
Payer: Managed Care, Other (non HMO) | Attending: Physical Medicine & Rehabilitation | Admitting: Physical Medicine & Rehabilitation

## 2017-09-07 VITALS — BP 122/82 | HR 77 | Ht 68.0 in | Wt 164.0 lb

## 2017-09-07 DIAGNOSIS — I639 Cerebral infarction, unspecified: Secondary | ICD-10-CM

## 2017-09-07 DIAGNOSIS — I6381 Other cerebral infarction due to occlusion or stenosis of small artery: Secondary | ICD-10-CM

## 2017-09-07 DIAGNOSIS — I69354 Hemiplegia and hemiparesis following cerebral infarction affecting left non-dominant side: Secondary | ICD-10-CM | POA: Diagnosis not present

## 2017-09-07 DIAGNOSIS — I69398 Other sequelae of cerebral infarction: Secondary | ICD-10-CM

## 2017-09-07 DIAGNOSIS — R5383 Other fatigue: Secondary | ICD-10-CM | POA: Insufficient documentation

## 2017-09-07 DIAGNOSIS — I10 Essential (primary) hypertension: Secondary | ICD-10-CM | POA: Insufficient documentation

## 2017-09-07 DIAGNOSIS — R209 Unspecified disturbances of skin sensation: Secondary | ICD-10-CM

## 2017-09-07 DIAGNOSIS — R269 Unspecified abnormalities of gait and mobility: Secondary | ICD-10-CM | POA: Diagnosis not present

## 2017-09-07 DIAGNOSIS — M5442 Lumbago with sciatica, left side: Secondary | ICD-10-CM | POA: Diagnosis not present

## 2017-09-07 DIAGNOSIS — M545 Low back pain: Secondary | ICD-10-CM | POA: Diagnosis not present

## 2017-09-07 DIAGNOSIS — N400 Enlarged prostate without lower urinary tract symptoms: Secondary | ICD-10-CM | POA: Diagnosis not present

## 2017-09-07 DIAGNOSIS — E785 Hyperlipidemia, unspecified: Secondary | ICD-10-CM | POA: Diagnosis not present

## 2017-09-07 DIAGNOSIS — G8929 Other chronic pain: Secondary | ICD-10-CM

## 2017-09-07 MED ORDER — AMANTADINE HCL 100 MG PO CAPS: 100.0000 mg | ORAL_CAPSULE | Freq: Two times a day (BID) | ORAL | 1 refills | Status: DC

## 2017-09-07 MED ORDER — GABAPENTIN 600 MG PO TABS: 600.0000 mg | ORAL_TABLET | Freq: Three times a day (TID) | ORAL | 1 refills | Status: DC

## 2017-09-07 NOTE — Progress Notes (Signed)
Subjective:    Patient ID: Robert Sandoval, male    DOB: 09/07/1960, 57 y.o.   MRN: [id number]  HPI 57 year old right-handed male of [place] descent with history of hypertension, hyperlipidemia, low back pain with left leg extremity radiculopathy, presents for follow up for right basal ganglia/corona radiata and occipital infracts.  Last clinic visit 08/09/17.  Since that time, pt was seen to fil out disability paperwork. Dysesthesias cont to improve.  He still has fatigue.  He completed therapies.  Denies falls. He had an MRI of his back.  Overall, he states he is doing better.   Pain Inventory Average Pain 2 Pain Right Now 2 My pain is stabbing  In the last 24 hours, has pain interfered with the following? General activity 2 Relation with others 0 Enjoyment of life 0 What TIME of day is your pain at its worst? morning Sleep (in general) Fair  Pain is worse with: walking and standing Pain improves with: na Relief from Meds: 5  Mobility use a cane use a walker ability to climb steps?  no do you drive?  yes transfers alone  Function employed # of hrs/week n/a I need assistance with the following:  household duties and shopping  Neuro/Psych numbness  Prior Studies Any changes since last visit?  no  Physicians involved in your care Primary care Dr Carlota Raspberry   Family History  Problem Relation Age of Onset  . Hyperlipidemia Father   . Asthma Father    Social History   Socioeconomic History  . Marital status: Divorced    Spouse name: Not on file  . Number of children: Not on file  . Years of education: Not on file  . Highest education level: Not on file  Occupational History  . Not on file  Social Needs  . Financial resource strain: Not on file  . Food insecurity:    Worry: Not on file    Inability: Not on file  . Transportation needs:    Medical: Not on file    Non-medical: Not on file  Tobacco Use  . Smoking status: Never Smoker  . Smokeless tobacco:  Never Used  Substance and Sexual Activity  . Alcohol use: Yes  . Drug use: No  . Sexual activity: Not on file  Lifestyle  . Physical activity:    Days per week: Not on file    Minutes per session: Not on file  . Stress: Not on file  Relationships  . Social connections:    Talks on phone: Not on file    Gets together: Not on file    Attends religious service: Not on file    Active member of club or organization: Not on file    Attends meetings of clubs or organizations: Not on file    Relationship status: Not on file  Other Topics Concern  . Not on file  Social History Narrative  . Not on file   No past surgical history on file. Past Medical History:  Diagnosis Date  . Allergy   . BPH (benign prostatic hyperplasia)    followed by [facility]  . Hyperlipidemia   . Hypertension   . Low back pain radiating to left lower extremity    sees Dr. Tonita Cong  . Stroke ([facility])    BP 122/82   Pulse 77   Ht 5\' 8"  (1.727 m)   Wt 164 lb (74.4 kg)   SpO2 96%   BMI 24.94 kg/m   Opioid Risk Score:  Fall Risk Score:  `1  Depression screen PHQ 2/9  Depression screen [facility] 2/9 09/07/2017 07/26/2017 06/22/2017 06/19/2017 08/05/2016 07/13/2015 05/01/2015  Decreased Interest 0 0 0 0 0 0 0  Down, Depressed, Hopeless 0 0 0 0 0 0 0  PHQ - 2 Score 0 0 0 0 0 0 0   Review of Systems  Constitutional: Negative.   HENT: Negative.   Eyes: Negative.   Respiratory: Negative.   Cardiovascular: Negative.   Gastrointestinal: Negative.   Endocrine: Negative.   Genitourinary: Negative.   Musculoskeletal: Positive for back pain and gait problem.  Skin: Negative.   Allergic/Immunologic: Negative.   Neurological: Positive for numbness.  Hematological: Negative.   Psychiatric/Behavioral: Negative.   All other systems reviewed and are negative.     Objective:   Physical Exam General: No acute distress. Well-developed. Well-nourished. HENT: Normocephalic. Atraumatic. Eyes: EOMI. No discharge. Heart:  RRR. No JVD. Lungs: Clear to auscultation, breathing unllabored Abdomen: Positive bowel sounds, nondistended Musculoskeletal: No edema or tenderness in extremities Neurologic: alert and oriented Motor:  4+/5 left deltoid bicep tricep grip  4+/5 left hip flexor, knee extensor, ankle dorsiflexor (pain inhibition) Sensation diminished to light touch left foot Skin: warm and dry. Intact. Psych: Mood and affect are appropriate    Assessment & Plan:  57 year old right-handed male of [place] descent with history of hypertension, hyperlipidemia, low back pain with left leg extremity radiculopathy, presents for follow up for right basal ganglia/corona radiata and occipital infracts.  1.  Left hemiparesis, dysesthesias secondary to right basal ganglia/corona radiata and occipital infarcts  Fatigue and dysesthesias main complaints, both improving  Cont HEP  Cont follow up with Neurology - encouraged follow up to discuss anticoagulation  Will trial amantadine 100 with breakfast and lunch for fatigue  Gabapentin increased to 600 TID for dysesthesias  May return to work- [place] forms provided  2. LBP/Pain Management:   Following up with Ortho  He had an MRI, stating that therapy was recommended  3. Gait abnormality  Cont HEP  No longer requires assistive device

## 2017-09-11 ENCOUNTER — Telehealth: Payer: Self-pay | Admitting: Adult Health

## 2017-09-11 NOTE — Telephone Encounter (Signed)
Refill please. 90 day supply 4 refills. After that, can continue to obtain from PCP. Thank you

## 2017-09-11 NOTE — Telephone Encounter (Addendum)
Rn spoke with patient that Robert Billow NP only wants him on plavix 75mg  long term. He should not continue aspirin. Rn stated Robert Billow NP gave refills. Rn stated he needs to contact his PCP Robert Sandoval to manage the refills of plavix ongoing. RN stated it was sent to [facility] on [address]. PT verbalized that he will be on plavix ongoing, and also the PCP will need to manage once this rx expires.

## 2017-09-11 NOTE — Telephone Encounter (Signed)
Patient requesting refill of  clopidogrel (PLAVIX) 75 MG tablet sent to [facility] on [address].  Is patient to continue this medication or go back to taking aspirin.

## 2017-09-12 ENCOUNTER — Other Ambulatory Visit: Payer: Self-pay

## 2017-09-12 NOTE — Patient Outreach (Signed)
Telephone outreach to patient to obtain mRS was successfully completed. mRS = 1 

## 2017-09-27 ENCOUNTER — Other Ambulatory Visit: Payer: Self-pay

## 2017-09-27 MED ORDER — CLOPIDOGREL BISULFATE 75 MG PO TABS: 75.0000 mg | ORAL_TABLET | Freq: Every day | ORAL | 4 refills | Status: DC

## 2017-09-27 NOTE — Telephone Encounter (Signed)
Refill sent to pharmacy.  Refill done per Janett Billow NP for a year. Once this refill ends she recommend pt obtain refills from his PCP long term.

## 2017-09-27 NOTE — Telephone Encounter (Signed)
Pt called stating the pharmacy hadn't received medication Plavix. Please resend

## 2017-09-29 ENCOUNTER — Other Ambulatory Visit: Payer: Self-pay | Admitting: Physical Medicine & Rehabilitation

## 2017-10-05 ENCOUNTER — Encounter: Payer: Self-pay | Admitting: Physical Medicine & Rehabilitation

## 2017-10-05 ENCOUNTER — Encounter
Payer: Managed Care, Other (non HMO) | Attending: Physical Medicine & Rehabilitation | Admitting: Physical Medicine & Rehabilitation

## 2017-10-05 VITALS — BP 134/91 | HR 72 | Ht 68.0 in | Wt 162.8 lb

## 2017-10-05 DIAGNOSIS — N400 Enlarged prostate without lower urinary tract symptoms: Secondary | ICD-10-CM | POA: Diagnosis not present

## 2017-10-05 DIAGNOSIS — M545 Low back pain: Secondary | ICD-10-CM | POA: Diagnosis not present

## 2017-10-05 DIAGNOSIS — E785 Hyperlipidemia, unspecified: Secondary | ICD-10-CM | POA: Diagnosis not present

## 2017-10-05 DIAGNOSIS — R209 Unspecified disturbances of skin sensation: Secondary | ICD-10-CM

## 2017-10-05 DIAGNOSIS — I69398 Other sequelae of cerebral infarction: Secondary | ICD-10-CM

## 2017-10-05 DIAGNOSIS — I10 Essential (primary) hypertension: Secondary | ICD-10-CM | POA: Diagnosis present

## 2017-10-05 DIAGNOSIS — I69354 Hemiplegia and hemiparesis following cerebral infarction affecting left non-dominant side: Secondary | ICD-10-CM | POA: Insufficient documentation

## 2017-10-05 DIAGNOSIS — I639 Cerebral infarction, unspecified: Secondary | ICD-10-CM

## 2017-10-05 DIAGNOSIS — R5383 Other fatigue: Secondary | ICD-10-CM | POA: Diagnosis not present

## 2017-10-05 DIAGNOSIS — R269 Unspecified abnormalities of gait and mobility: Secondary | ICD-10-CM | POA: Diagnosis not present

## 2017-10-05 DIAGNOSIS — I6381 Other cerebral infarction due to occlusion or stenosis of small artery: Secondary | ICD-10-CM

## 2017-10-05 NOTE — Progress Notes (Signed)
Subjective:    Patient ID: Robert Sandoval, male    DOB: [birth year], 57 y.o.   MRN: [id number]  HPI 57 year old right-handed male of [place] descent with history of hypertension, hyperlipidemia, low back pain with left leg extremity radiculopathy, presents for follow up for right basal ganglia/corona radiata and occipital infracts.  Last clinic visit 09/07/17.  Since that time, pt states fatigue has improved with amantadine.  He has an appointment with Neurology. He also has improvement with dyesthesias with Gabapentin. He has returned to regular schedule for work.   Pain Inventory Average Pain 0 Pain Right Now 0 My pain is na  In the last 24 hours, has pain interfered with the following? General activity na Relation with others 0 Enjoyment of life 0 What TIME of day is your pain at its worst? na Sleep (in general) Fair  Pain is worse with: na Pain improves with: na Relief from Meds: na  Mobility walk without assistance ability to climb steps?  yes do you drive?  yes  Function employed # of hrs/week 40  Neuro/Psych weakness numbness tingling  Prior Studies Any changes since last visit?  no  Physicians involved in your care Any changes since last visit?  no   Family History  Problem Relation Age of Onset  . Hyperlipidemia Father   . Asthma Father    Social History   Socioeconomic History  . Marital status: Divorced    Spouse name: Not on file  . Number of children: Not on file  . Years of education: Not on file  . Highest education level: Not on file  Occupational History  . Not on file  Social Needs  . Financial resource strain: Not on file  . Food insecurity:    Worry: Not on file    Inability: Not on file  . Transportation needs:    Medical: Not on file    Non-medical: Not on file  Tobacco Use  . Smoking status: Never Smoker  . Smokeless tobacco: Never Used  Substance and Sexual Activity  . Alcohol use: Yes  . Drug use: No  . Sexual activity:  Not on file  Lifestyle  . Physical activity:    Days per week: Not on file    Minutes per session: Not on file  . Stress: Not on file  Relationships  . Social connections:    Talks on phone: Not on file    Gets together: Not on file    Attends religious service: Not on file    Active member of club or organization: Not on file    Attends meetings of clubs or organizations: Not on file    Relationship status: Not on file  Other Topics Concern  . Not on file  Social History Narrative  . Not on file   No past surgical history on file. Past Medical History:  Diagnosis Date  . Allergy   . BPH (benign prostatic hyperplasia)    followed by [facility]  . Hyperlipidemia   . Hypertension   . Low back pain radiating to left lower extremity    sees Dr. Tonita Cong  . Stroke [facility])    There were no vitals taken for this visit.  Opioid Risk Score:   Fall Risk Score:  `1  Depression screen PHQ 2/9  Depression screen [facility] 2/9 09/07/2017 07/26/2017 06/22/2017 06/19/2017 08/05/2016 07/13/2015 05/01/2015  Decreased Interest 0 0 0 0 0 0 0  Down, Depressed, Hopeless 0 0 0 0 0 0 0  PHQ - 2 Score 0 0 0 0 0 0 0   Review of Systems  Constitutional: Negative.   HENT: Negative.   Eyes: Negative.   Respiratory: Negative.   Cardiovascular: Negative.   Gastrointestinal: Negative.   Endocrine: Negative.   Genitourinary: Negative.   Musculoskeletal: Positive for back pain.  Skin: Negative.   Allergic/Immunologic: Negative.   Neurological: Positive for weakness and numbness.  Hematological: Negative.   Psychiatric/Behavioral: Negative.   All other systems reviewed and are negative.     Objective:   Physical Exam General: No acute distress. Well-developed. Well-nourished. HENT: Normocephalic. Atraumatic. Eyes: EOMI. No discharge. Heart: RRR. No JVD. Lungs: Clear to auscultation, breathing unlabored. Abdomen: Positive bowel sounds, nondistended Musculoskeletal: No edema or tenderness in  extremities Neurologic: alert and oriented Motor:  4+-5/5 left deltoid bicep tricep grip  4+-5/5 left hip flexor, knee extensor, ankle dorsiflexor (pain inhibition) Sensation diminished to light touch left foot Skin: warm and dry. Intact. Psych: Mood and affect are appropriate    Assessment & Plan:  57 year old right-handed male of [place] descent with history of hypertension, hyperlipidemia, low back pain with left leg extremity radiculopathy, presents for follow up for right basal ganglia/corona radiata and occipital infracts.  1.  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 LBP/Pain Management:   Following up with Ortho  He had an MRI, stating that therapy was recommended  3. Gait abnormality  Cont HEP  No longer requires assistive device

## 2017-10-09 ENCOUNTER — Other Ambulatory Visit: Payer: Self-pay | Admitting: Adult Health

## 2017-10-17 ENCOUNTER — Encounter: Payer: Self-pay | Admitting: Adult Health

## 2017-10-17 ENCOUNTER — Ambulatory Visit (INDEPENDENT_AMBULATORY_CARE_PROVIDER_SITE_OTHER): Payer: Managed Care, Other (non HMO) | Admitting: Adult Health

## 2017-10-17 VITALS — BP 123/83 | HR 78 | Ht 68.0 in | Wt 162.0 lb

## 2017-10-17 DIAGNOSIS — I10 Essential (primary) hypertension: Secondary | ICD-10-CM

## 2017-10-17 DIAGNOSIS — E785 Hyperlipidemia, unspecified: Secondary | ICD-10-CM

## 2017-10-17 DIAGNOSIS — I639 Cerebral infarction, unspecified: Secondary | ICD-10-CM

## 2017-10-17 NOTE — Progress Notes (Signed)
Robert Sandoval [address] [place].  [id number] [id number]       OFFICE FOLLOW UP NOTE  [name] Date of Birth:  [birth year] Medical Record Number:  [id number]   Reason for visit: Stroke follow up  CHIEF COMPLAINT:  Chief Complaint  Patient presents with  . Follow-up    he is doing well, no problems. He still has numbness/tingling in L hand but it feels strong to him. He is on Plavix.   Robert Sandoval Room 9    He is here alone     HPI: Robert Sandoval is being seen today in the office for right basal ganglia infarct status post IV TPA on 06/04/2017. History obtained from patient and chart review. Reviewed all radiology images and labs personally.  [name] a 57 y.o.malewith history of HTN and HLD who was admitted for left arm and leg weakness.  CT head reviewed and negative for acute abnormality and therefore TPA was given on 06/05/2017 at 0256. Tolerated tPA without difficulty with resultant left hemiparesis.  MRI head reviewed and showed right BG/CR infarct likely related to small vessel disease. CTA head neck unremarkable.  2D echo showed an EF of 60 to 65%.  LDL 87 and recommended to start Lipitor 20 mg daily.  Patient was on aspirin 81 mg daily PTA and recommended DAPT for 3 weeks and then Plavix alone.  Patient was discharged to [facility] for continued therapies in stable condition.  07/17/17 visit: Patient is being seen today for hospital follow-up.  He continues to have left hemiparesis with numbness and tingling but this has been improving.  He continues PT/OT at our neuro rehab clinic [place].  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  He has been taken out of work by his PCP until mid August and continues to feel at this time that he is not able to return to work due to continued fatigue that can make his symptoms worse especially when he drives 675+ miles.  He feels as though with the continuation of therapies he will be  ready to return to work by mid August.  He has stopped taking aspirin as he completed 3 weeks of DAPT but was continuing Plavix up until 2 days ago when he ran out of the prescription but prior to this he denies bleeding or bruising.  Recently ran out of Lipitor prescription 2 days ago but denied side effects of myalgias.  Blood pressure mildly elevated at today's appointment at 144/91 but does check this at home and is typically lower.  He states he is trying to stay active and eat healthy.  Denies new or worsening stroke/TIA symptoms.  Interval history 10/17/2017: Patient is being seen today for scheduled follow-up visit.  He does continue to have mild left upper extremity weakness along with some numbness and tingling but this has been improving.  He has returned to work full-time and does state towards the end of the day he does have increased fatigue but this also has been improving.  He was started on amantadine by Dr. Posey Pronto to help with his excessive fatigue post stroke which patient believes has been helping him.  He does state that towards the end of the day when he is more fatigued or after increased activity, he can have slightly worsening left upper extremity weakness along with worsening of left ankle dorsiflexion weakness which at times makes it difficult for him to ambulate up a  flight of stairs.  He continues to take Plavix without side effects of bleeding or bruising.  Continues to take Lipitor without side effects of myalgias.  Blood pressure today satisfactory 123/83.  He continues to exercise daily along with eating a healthy diet.  Denies new or worsening stroke/TIA symptoms.   ROS:   14 system review of systems performed and negative with exception of ringing in ears and numbness  PMH:  Past Medical History:  Diagnosis Date  . Allergy   . BPH (benign prostatic hyperplasia)    followed by [facility]  . Hyperlipidemia   . Hypertension   . Low back pain radiating to left lower  extremity    sees Dr. Tonita Cong  . Stroke [facility])     PSH: History reviewed. No pertinent surgical history.  Social History:  Social History   Socioeconomic History  . Marital status: Married    Spouse name: Not on file  . Number of children: 1  . Years of education: Not on file  . Highest education level: High school graduate  Occupational History  . Not on file  Social Needs  . Financial resource strain: Not on file  . Food insecurity:    Worry: Not on file    Inability: Not on file  . Transportation needs:    Medical: Not on file    Non-medical: Not on file  Tobacco Use  . Smoking status: Never Smoker  . Smokeless tobacco: Never Used  Substance and Sexual Activity  . Alcohol use: Yes    Comment: none since he had the stroke. He used to drink max of 2 beers per weekend  . Drug use: No  . Sexual activity: Not on file  Lifestyle  . Physical activity:    Days per week: Not on file    Minutes per session: Not on file  . Stress: Not on file  Relationships  . Social connections:    Talks on phone: Not on file    Gets together: Not on file    Attends religious service: Not on file    Active member of club or organization: Not on file    Attends meetings of clubs or organizations: Not on file    Relationship status: Not on file  . Intimate partner violence:    Fear of current or ex partner: Not on file    Emotionally abused: Not on file    Physically abused: Not on file    Forced sexual activity: Not on file  Other Topics Concern  . Not on file  Social History Narrative   Lives at home with his wife   Right handed   Caffeine: 1 cup of coffee in the mornings     Family History:  Family History  Problem Relation Age of Onset  . Hyperlipidemia Father   . Asthma Father     Medications:   Current Outpatient Medications on File Prior to Visit  Medication Sig Dispense Refill  . amantadine (SYMMETREL) 100 MG capsule Take 1 capsule (100 mg total) by mouth 2 (two)  times daily with breakfast and lunch. 60 capsule 1  . atorvastatin (LIPITOR) 20 MG tablet TAKE 1 TABLET (20 MG TOTAL) BY MOUTH DAILY AT 6 PM. 90 tablet 0  . clopidogrel (PLAVIX) 75 MG tablet Take 1 tablet (75 mg total) by mouth daily. 90 tablet 4  . fluticasone (FLONASE) 50 MCG/ACT nasal spray Place 1 spray into both nostrils daily.  2  . gabapentin (NEURONTIN) 600  MG tablet TAKE 1 TABLET BY MOUTH THREE TIMES A DAY 270 tablet 1  . lisinopril (PRINIVIL,ZESTRIL) 10 MG tablet Take 1 tablet (10 mg total) by mouth daily. 90 tablet 1  . loratadine (CLARITIN) 10 MG tablet Take 1 tablet (10 mg total) by mouth daily.    . polyethylene glycol (MIRALAX / GLYCOLAX) packet Take 17 g by mouth 2 (two) times daily. 14 each 0  . tamsulosin (FLOMAX) 0.4 MG CAPS capsule Take 0.4 mg by mouth daily.     No current facility-administered medications on file prior to visit.     Allergies:  No Known Allergies   Physical Exam  Vitals:   10/17/17 0812  BP: 123/83  Pulse: 78  Weight: 162 lb (73.5 kg)  Height: 5\' 8"  (1.727 m)   Body mass index is 24.63 kg/m. No exam data present  General: well developed, well nourished, pleasant middle-aged Hispanic male, seated, in no evident distress Head: head normocephalic and atraumatic.   Neck: supple with no carotid or supraclavicular bruits Cardiovascular: regular rate and rhythm, no murmurs Musculoskeletal: no deformity Skin:  no rash/petichiae Vascular:  Normal pulses all extremities  Neurologic Exam Mental Status: Awake and fully alert. Oriented to place and time. Recent and remote memory intact. Attention span, concentration and fund of knowledge appropriate. Mood and affect appropriate.  Cranial Nerves: Fundoscopic exam reveals sharp disc margins. Pupils equal, briskly reactive to light. Extraocular movements full without nystagmus. Visual fields full to confrontation. Hearing intact. Facial sensation intact. Face, tongue, palate moves normally and  symmetrically.  Motor: Normal bulk and tone. Normal strength in all tested extremity muscles except for very mild right ankle dorsiflexion weakness Sensory.:  Decreased sensation in LUE compared to RUE but normal sensation noted in bilateral lower extremities Coordination: Rapid alternating movements normal and right side.  Finger-to-nose and heel-to-shin performed accurately on right side.  Orbits right arm over left arm. Gait and Station: Arises from chair without difficulty. Stance is normal. Gait demonstrates normal stride length and balance . Able to heel, toe and tandem walk without difficulty.  Reflexes: 1+ and symmetric. Toes downgoing.     Diagnostic Data (Labs, Imaging, Testing)  CT HEAD WO CONTRAST 06/05/2017 IMPRESSION: 1. No acute intracranial infarct or other abnormality identified. 2. ASPECTS is 10.  CT ANGIO HEAD W OR WO CONTRAST CTA NGIO NECK W OR WO CONTRAST 06/05/2016 IMPRESSION: Normal CTA of the head and neck with no evidence for emergent large vessel occlusion. No significant atheromatous disease for patient age. No hemodynamically significant or correctable stenosis.  MR BRAIN WO CONTRAST 06/06/2017 IMPRESSION: 1. Acute RIGHT basal ganglia/corona radiata infarct. Tiny RIGHT acute occipital posterior watershed territory infarcts. 2. Otherwise negative noncontrast MRI of the head for age.  ECHOCARDIOGRAM 06/05/2017 Study Conclusions - Left ventricle: The cavity size was normal. Wall thickness was   normal. Systolic function was normal. The estimated ejection   fraction was in the range of 60% to 65%. Wall motion was normal;   there were no regional wall motion abnormalities. Doppler   parameters are consistent with abnormal left ventricular   relaxation (grade 1 diastolic dysfunction). - Aortic valve: There was mild to moderate regurgitation. - Pulmonary arteries: Systolic pressure was mildly increased. PA   peak pressure: 31 mm Hg (S). Impressions: - No  cardiac source of emboli was indentified.   ASSESSMENT: Tayquan Gassman is a 57 y.o. 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 Vascular risk factors include  HTN and HLD.  Patient is being seen today for scheduled follow-up visit and does continue to have mild left hemiparesis with numbness and tingling but overall has been improving.  PLAN: -Continue clopidogrel 75 mg daily  and lipitor  for secondary stroke prevention -F/u with PCP regarding your HLD and HTN management -f/u with Dr. Posey Pronto as scheduled -Advised patient that he can try to use foot drop brace towards the end of the day to prevent falls if needed.  Patient was also advised to not wear this at all times as there is a chance of foot will become dependent and can lead to increased weakness.  Patient verbalized understanding. -continue to monitor BP at home -Recommended to continue daily exercise routine along with maintaining a healthy diet -patient was provided with healthy diet patient education from up-to-date -Maintain strict control of hypertension with blood pressure goal below 130/90, diabetes with hemoglobin A1c goal below 6.5% and cholesterol with LDL cholesterol (bad cholesterol) goal below 70 mg/dL. I also advised the patient to eat a healthy diet with plenty of whole grains, cereals, fruits and vegetables, exercise regularly and maintain ideal body weight.  Follow up in 6 months or call earlier if needed  Greater than 50% of time during this 25 minute visit was spent on counseling,explanation of diagnosis of right BG/CR infarct, reviewing risk factor management of HLD and HTN, planning of further management, discussion with patient and family and coordination of care    [name], [facility]  [facility] [address] [place], [place] [address]  Phone [phone] Fax [phone]

## 2017-10-17 NOTE — Progress Notes (Signed)
I agree with the above plan 

## 2017-10-17 NOTE — Patient Instructions (Addendum)
Continue clopidogrel 75 mg daily  and lipitor 20mg   for secondary stroke prevention  Continue to follow up with PCP regarding cholesterol and blood pressure management   You can try a foot drop brace (you can purchase these online such as amaz[employer]) - you can put this brace on towards the end of the day as your symptoms worsen with increase activity or fatigue. Do not wear at all times as your ankle will become dependent on this brace which can lead to increased weakness  As time goes on, you should continue to see improvement with both your weakness, numbness and fatigue. Continue to do your exercises and stay active. You are doing great!  You were provided with information for a healthy diet which will help keep your cholesterol levels in good range along with increasing your good cholesterol  Continue to monitor blood pressure at home  Maintain strict control of hypertension with blood pressure goal below 130/90, diabetes with hemoglobin A1c goal below 6.5% and cholesterol with LDL cholesterol (bad cholesterol) goal below 70 mg/dL. I also advised the patient to eat a healthy diet with plenty of whole grains, cereals, fruits and vegetables, exercise regularly and maintain ideal body weight.  Followup in the future with me in 6 months or call earlier if needed       Thank you for coming to see [place] at [facility]. I hope we have been able to provide you high quality care today.  You may receive a patient satisfaction survey over the next few weeks. We would appreciate your feedback and comments so that we may continue to improve ourselves and the health of our patients.

## 2017-11-06 ENCOUNTER — Other Ambulatory Visit: Payer: Self-pay | Admitting: Adult Health

## 2018-01-03 ENCOUNTER — Encounter: Payer: 59 | Attending: Physical Medicine & Rehabilitation | Admitting: Physical Medicine & Rehabilitation

## 2018-01-03 ENCOUNTER — Encounter: Payer: Self-pay | Admitting: Physical Medicine & Rehabilitation

## 2018-01-03 VITALS — BP 139/91 | HR 65 | Ht 68.0 in | Wt 162.0 lb

## 2018-01-03 DIAGNOSIS — E785 Hyperlipidemia, unspecified: Secondary | ICD-10-CM | POA: Diagnosis not present

## 2018-01-03 DIAGNOSIS — I69354 Hemiplegia and hemiparesis following cerebral infarction affecting left non-dominant side: Secondary | ICD-10-CM | POA: Diagnosis not present

## 2018-01-03 DIAGNOSIS — M545 Low back pain: Secondary | ICD-10-CM | POA: Insufficient documentation

## 2018-01-03 DIAGNOSIS — N400 Enlarged prostate without lower urinary tract symptoms: Secondary | ICD-10-CM | POA: Diagnosis not present

## 2018-01-03 DIAGNOSIS — R209 Unspecified disturbances of skin sensation: Secondary | ICD-10-CM

## 2018-01-03 DIAGNOSIS — I69398 Other sequelae of cerebral infarction: Secondary | ICD-10-CM | POA: Diagnosis not present

## 2018-01-03 DIAGNOSIS — I10 Essential (primary) hypertension: Secondary | ICD-10-CM | POA: Insufficient documentation

## 2018-01-03 DIAGNOSIS — R269 Unspecified abnormalities of gait and mobility: Secondary | ICD-10-CM

## 2018-01-03 DIAGNOSIS — R5383 Other fatigue: Secondary | ICD-10-CM

## 2018-01-03 MED ORDER — AMANTADINE HCL 100 MG PO CAPS: 100.0000 mg | ORAL_CAPSULE | Freq: Two times a day (BID) | ORAL | 2 refills | Status: DC

## 2018-01-03 NOTE — Progress Notes (Signed)
Subjective:    Patient ID: Robert Sandoval, male    DOB: 17-Feb-1960, 57 y.o.   MRN: [id number]  HPI 57 year old right-handed male of [place] descent with history of hypertension, hyperlipidemia, low back pain with left leg extremity radiculopathy, presents for follow up for right basal ganglia/corona radiata and occipital infracts.  Last clinic visit 10/08/17.  Since that time, pt states he continues to have fatigue, but improved.  He continues to have dysesthesias as well, but overall improved. Denies falls.   Pain Inventory Average Pain 0 Pain Right Now 0 My pain is na  In the last 24 hours, has pain interfered with the following? General activity 6 Relation with others 9 Enjoyment of life 9 What TIME of day is your pain at its worst? na Sleep (in general) Fair  Pain is worse with: na Pain improves with: na Relief from Meds: na  Mobility walk without assistance ability to climb steps?  yes do you drive?  yes  Function employed # of hrs/week 40  Neuro/Psych weakness numbness tingling  Prior Studies Any changes since last visit?  no  Physicians involved in your care Any changes since last visit?  no   Family History  Problem Relation Age of Onset  . Hyperlipidemia Father   . Asthma Father    Social History   Socioeconomic History  . Marital status: Married    Spouse name: Not on file  . Number of children: 1  . Years of education: Not on file  . Highest education level: High school graduate  Occupational History  . Not on file  Social Needs  . Financial resource strain: Not on file  . Food insecurity:    Worry: Not on file    Inability: Not on file  . Transportation needs:    Medical: Not on file    Non-medical: Not on file  Tobacco Use  . Smoking status: Never Smoker  . Smokeless tobacco: Never Used  Substance and Sexual Activity  . Alcohol use: Yes    Comment: none since he had the stroke. He used to drink max of 2 beers per weekend  . Drug  use: No  . Sexual activity: Not on file  Lifestyle  . Physical activity:    Days per week: Not on file    Minutes per session: Not on file  . Stress: Not on file  Relationships  . Social connections:    Talks on phone: Not on file    Gets together: Not on file    Attends religious service: Not on file    Active member of club or organization: Not on file    Attends meetings of clubs or organizations: Not on file    Relationship status: Not on file  Other Topics Concern  . Not on file  Social History Narrative   Lives at home with his wife   Right handed   Caffeine: 1 cup of coffee in the mornings    No past surgical history on file. Past Medical History:  Diagnosis Date  . Allergy   . BPH (benign prostatic hyperplasia)    followed by [facility]  . Hyperlipidemia   . Hypertension   . Low back pain radiating to left lower extremity    sees Dr. Tonita Cong  . Stroke ([place])    BP (!) 139/91   Pulse 65   Ht 5\' 8"  (1.727 m)   Wt 162 lb (73.5 kg)   SpO2 98%   BMI 24.63  kg/m   Opioid Risk Score:   Fall Risk Score:  `1  Depression screen PHQ 2/9  Depression screen [facility] 2/9 09/07/2017 07/26/2017 06/22/2017 06/19/2017 08/05/2016 07/13/2015 05/01/2015  Decreased Interest 0 0 0 0 0 0 0  Down, Depressed, Hopeless 0 0 0 0 0 0 0  PHQ - 2 Score 0 0 0 0 0 0 0   Review of Systems  Constitutional: Negative.   HENT: Negative.   Eyes: Negative.   Respiratory: Negative.   Cardiovascular: Negative.   Gastrointestinal: Negative.   Endocrine: Negative.   Genitourinary: Negative.   Musculoskeletal: Negative.   Skin: Negative.   Allergic/Immunologic: Negative.   Neurological: Positive for weakness and numbness.  Hematological: Negative.   Psychiatric/Behavioral: Negative.   All other systems reviewed and are negative.     Objective:   Physical Exam General: No acute distress. Well-developed. Well-nourished. HENT: Normocephalic. Atraumatic. Eyes: EOMI. No discharge. Heart: RRR. No  JVD. Lungs: Clear to auscultation, breathing unlabored. Abdomen: Positive bowel sounds, nondistended Musculoskeletal: No edema or tenderness in extremities Neurologic: alert and oriented Motor:  5-/5 left deltoid bicep tricep grip  5-/5 left hip flexor, knee extensor, ankle dorsiflexor Sensation diminished to light touch left foot Skin: warm and dry. Intact. Psych: Mood and affect are appropriate    Assessment & Plan:  57 year old right-handed male of [place] descent with history of hypertension, hyperlipidemia, low back pain with left leg extremity radiculopathy, presents for follow up for right basal ganglia/corona radiata and occipital infracts.  1.  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 Gait abnormality  Cont HEP  No longer requires assistive device

## 2018-01-04 ENCOUNTER — Encounter: Payer: 59 | Admitting: Physical Medicine & Rehabilitation

## 2018-01-19 ENCOUNTER — Other Ambulatory Visit: Payer: Self-pay | Admitting: Family Medicine

## 2018-01-19 DIAGNOSIS — I10 Essential (primary) hypertension: Secondary | ICD-10-CM

## 2018-01-21 NOTE — Telephone Encounter (Signed)
Requested Prescriptions  Pending Prescriptions Disp Refills  . lisinopril (PRINIVIL,ZESTRIL) 10 MG tablet [Pharmacy Med Name: LISINOPRIL 10 MG TABLET] 90 tablet 0    Sig: TAKE 1 TABLET BY MOUTH EVERY DAY     Cardiovascular:  ACE Inhibitors Failed - 01/19/2018  9:10 AM      Failed - Cr in normal range and within 180 days    Creat  Date Value Ref Range Status  07/13/2015 0.78 0.70 - 1.33 mg/dL Final    Comment:      For patients > or = 59 years of age: The upper reference limit for Creatinine is approximately 13% higher for people identified as African-American.      Creatinine, Ser  Date Value Ref Range Status  06/12/2017 0.82 0.61 - 1.24 mg/dL Final         Failed - K in normal range and within 180 days    Potassium  Date Value Ref Range Status  06/12/2017 4.1 3.5 - 5.1 mmol/L Final         Failed - Last BP in normal range    BP Readings from Last 1 Encounters:  01/03/18 (!) 139/91         Passed - Patient is not pregnant      Passed - Valid encounter within last 6 months    Recent Outpatient Visits          5 months ago Hyperlipidemia, unspecified hyperlipidemia type   [facility] [place], [place]   7 months ago Acute ischemic stroke [facility]) - R basal ganglia s/p IV tPA   [facility], Ranell Patrick, MD   1 year ago Hypertension, unspecified type   [facility] at [facility], Fenton Malling, MD   2 years ago Annual physical exam   [facility] at [place], [place]   2 years ago Viral URI with cough   [facility] at [place], PA-C

## 2018-02-05 ENCOUNTER — Telehealth: Payer: Self-pay | Admitting: Adult Health

## 2018-02-05 MED ORDER — ATORVASTATIN CALCIUM 20 MG PO TABS: 20.0000 mg | ORAL_TABLET | Freq: Every day | ORAL | 0 refills | Status: DC

## 2018-02-05 NOTE — Telephone Encounter (Signed)
Pt has called for a refill on his atorvastatin (LIPITOR) 20 MG tablet [id number]

## 2018-02-05 NOTE — Addendum Note (Signed)
Addended by: Thomes Cake on: 02/05/2018 03:16 PM   Modules accepted: Orders

## 2018-03-27 ENCOUNTER — Other Ambulatory Visit: Payer: Self-pay | Admitting: Physical Medicine & Rehabilitation

## 2018-03-28 ENCOUNTER — Encounter: Payer: 59 | Admitting: Physical Medicine & Rehabilitation

## 2018-03-29 ENCOUNTER — Encounter: Payer: Self-pay | Admitting: Physical Medicine & Rehabilitation

## 2018-03-29 ENCOUNTER — Encounter: Payer: 59 | Attending: Physical Medicine & Rehabilitation | Admitting: Physical Medicine & Rehabilitation

## 2018-03-29 ENCOUNTER — Other Ambulatory Visit: Payer: Self-pay

## 2018-03-29 VITALS — BP 110/70 | HR 65 | Ht 68.0 in | Wt 157.4 lb

## 2018-03-29 DIAGNOSIS — I69354 Hemiplegia and hemiparesis following cerebral infarction affecting left non-dominant side: Secondary | ICD-10-CM | POA: Diagnosis not present

## 2018-03-29 DIAGNOSIS — R5383 Other fatigue: Secondary | ICD-10-CM | POA: Insufficient documentation

## 2018-03-29 DIAGNOSIS — I69398 Other sequelae of cerebral infarction: Secondary | ICD-10-CM

## 2018-03-29 DIAGNOSIS — M545 Low back pain: Secondary | ICD-10-CM | POA: Diagnosis not present

## 2018-03-29 DIAGNOSIS — E785 Hyperlipidemia, unspecified: Secondary | ICD-10-CM | POA: Diagnosis not present

## 2018-03-29 DIAGNOSIS — N400 Enlarged prostate without lower urinary tract symptoms: Secondary | ICD-10-CM | POA: Insufficient documentation

## 2018-03-29 DIAGNOSIS — R269 Unspecified abnormalities of gait and mobility: Secondary | ICD-10-CM | POA: Insufficient documentation

## 2018-03-29 DIAGNOSIS — I6381 Other cerebral infarction due to occlusion or stenosis of small artery: Secondary | ICD-10-CM

## 2018-03-29 DIAGNOSIS — I639 Cerebral infarction, unspecified: Secondary | ICD-10-CM | POA: Diagnosis not present

## 2018-03-29 DIAGNOSIS — I10 Essential (primary) hypertension: Secondary | ICD-10-CM | POA: Diagnosis present

## 2018-03-29 DIAGNOSIS — R209 Unspecified disturbances of skin sensation: Secondary | ICD-10-CM

## 2018-03-29 MED ORDER — AMANTADINE HCL 100 MG PO CAPS: 200.0000 mg | ORAL_CAPSULE | Freq: Two times a day (BID) | ORAL | 1 refills | Status: DC

## 2018-03-29 NOTE — Progress Notes (Signed)
Subjective:    Patient ID: Robert Sandoval, male    DOB: [birth year], 59 y.o.   MRN: [id number]  HPI 58 year old right-handed male of [place] descent with history of hypertension, hyperlipidemia, low back pain with left leg extremity radiculopathy, presents for follow up for right basal ganglia/corona radiata and occipital infracts.  Last clinic visit 01/03/18.  Since that time, he has been doing HEP. Fatigue and tingling are improving. He is taking amantadine. Amantadine proving some benefit.  Gabapentin with benefit.   Pain Inventory Average Pain 0 Pain Right Now 0 My pain is na  In the last 24 hours, has pain interfered with the following? General activity 8 Relation with others 10 Enjoyment of life 9 What TIME of day is your pain at its worst? na Sleep (in general) Fair  Pain is worse with: na Pain improves with: na Relief from Meds: na  Mobility walk without assistance how many minutes can you walk? 50 ability to climb steps?  yes do you drive?  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  no  Physicians involved in your care Any changes since last visit?  no   Family History  Problem Relation Age of Onset  . Hyperlipidemia Father   . Asthma Father    Social History   Socioeconomic History  . Marital status: Married    Spouse name: Not on file  . Number of children: 1  . Years of education: Not on file  . Highest education level: High school graduate  Occupational History  . Not on file  Social Needs  . Financial resource strain: Not on file  . Food insecurity:    Worry: Not on file    Inability: Not on file  . Transportation needs:    Medical: Not on file    Non-medical: Not on file  Tobacco Use  . Smoking status: Never Smoker  . Smokeless tobacco: Never Used  Substance and Sexual Activity  . Alcohol use: Yes    Comment: none since he had the  stroke. He used to drink max of 2 beers per weekend  . Drug use: No  . Sexual activity: Not on file  Lifestyle  . Physical activity:    Days per week: Not on file    Minutes per session: Not on file  . Stress: Not on file  Relationships  . Social connections:    Talks on phone: Not on file    Gets together: Not on file    Attends religious service: Not on file    Active member of club or organization: Not on file    Attends meetings of clubs or organizations: Not on file    Relationship status: Not on file  Other Topics Concern  . Not on file  Social History Narrative   Lives at home with his wife   Right handed   Caffeine: 1 cup of coffee in the mornings    No past surgical history on file. Past Medical History:  Diagnosis Date  . Allergy   . BPH (benign prostatic hyperplasia)    followed by [facility]  . Hyperlipidemia   . Hypertension   . Low back pain radiating to left lower extremity    sees Dr. Tonita Cong  . Stroke ([place])    BP 110/70   Pulse 65   Ht 5\' 8"  (1.727 m)   Wt 157 lb  6.4 oz (71.4 kg)   SpO2 97%   BMI 23.93 kg/m   Opioid Risk Score:   Fall Risk Score:  `1  Depression screen PHQ 2/9  Depression screen [facility] 2/9 03/29/2018 09/07/2017 07/26/2017 06/22/2017 06/19/2017 08/05/2016 07/13/2015  Decreased Interest 0 0 0 0 0 0 0  Down, Depressed, Hopeless 0 0 0 0 0 0 0  PHQ - 2 Score 0 0 0 0 0 0 0   Review of Systems  Constitutional: Negative.   HENT: Negative.   Eyes: Negative.   Respiratory: Negative.   Cardiovascular: Negative.   Gastrointestinal: Negative.   Endocrine: Negative.   Genitourinary: Negative.   Musculoskeletal: Negative.   Skin: Negative.   Allergic/Immunologic: Negative.   Neurological: Positive for weakness.       Tingling  Hematological: Negative.   Psychiatric/Behavioral: Negative.   All other systems reviewed and are negative.     Objective:   Physical Exam General: No acute distress. Well-developed. Well-nourished. HENT:  Normocephalic. Atraumatic. Eyes: EOMI. No discharge. Heart: RRR. No JVD. Lungs: Clear to auscultation, breathing unlabored. Abdomen: Positive bowel sounds, nondistended Musculoskeletal: No edema or tenderness in extremities Neurologic: alert and oriented Motor:  5/5 left deltoid bicep tricep grip  5/5 left hip flexor, knee extensor, ankle dorsiflexor Skin: warm and dry. Intact. Psych: Mood and affect are appropriate    Assessment & Plan:  58 year old right-handed male of [place] descent with history of hypertension, hyperlipidemia, low back pain with left leg extremity radiculopathy, presents for follow up for right basal ganglia/corona radiata and occipital infracts.  1.  Left hemiparesis, dysesthesias secondary to right basal ganglia/corona radiata and occipital infarcts  Fatigue and dysesthesias main complaints, both continue to improve  Cont HEP  Follow up with Neurology  Increase amantadine 100 to 200mg  with breakfast and lunch for fatigue, helping  Cont Gabapentin 600 TID for dysesthesias, improving  Has returned to work

## 2018-03-30 ENCOUNTER — Other Ambulatory Visit: Payer: Self-pay | Admitting: Adult Health

## 2018-03-30 ENCOUNTER — Other Ambulatory Visit: Payer: Self-pay | Admitting: Physical Medicine & Rehabilitation

## 2018-04-01 ENCOUNTER — Other Ambulatory Visit: Payer: Self-pay

## 2018-04-01 MED ORDER — ATORVASTATIN CALCIUM 20 MG PO TABS: 20.0000 mg | ORAL_TABLET | Freq: Every day | ORAL | 0 refills | Status: DC

## 2018-04-03 ENCOUNTER — Ambulatory Visit: Payer: 59 | Admitting: Physical Medicine & Rehabilitation

## 2018-04-04 ENCOUNTER — Ambulatory Visit: Payer: 59 | Admitting: Physical Medicine & Rehabilitation

## 2018-04-05 ENCOUNTER — Other Ambulatory Visit: Payer: Self-pay | Admitting: Adult Health

## 2018-04-13 ENCOUNTER — Other Ambulatory Visit: Payer: Self-pay | Admitting: Family Medicine

## 2018-04-13 MED ORDER — ATORVASTATIN CALCIUM 20 MG PO TABS: 20.0000 mg | ORAL_TABLET | Freq: Every day | ORAL | 0 refills | Status: DC

## 2018-04-14 ENCOUNTER — Other Ambulatory Visit: Payer: Self-pay | Admitting: Family Medicine

## 2018-04-14 DIAGNOSIS — I10 Essential (primary) hypertension: Secondary | ICD-10-CM

## 2018-04-17 ENCOUNTER — Telehealth: Payer: Self-pay

## 2018-04-17 NOTE — Telephone Encounter (Signed)
If pt calls back please ask him if he receive the email from me to join meeting with Janett Billow NP on 04/18/2018 at 0945am. He needs to log onto his device where he downloaded the webex appt to do video. Please document thanks.  LEft vm for patient to call back if he receive email.

## 2018-04-18 ENCOUNTER — Encounter: Payer: Self-pay | Admitting: Adult Health

## 2018-04-18 ENCOUNTER — Ambulatory Visit (INDEPENDENT_AMBULATORY_CARE_PROVIDER_SITE_OTHER): Payer: 59 | Admitting: Adult Health

## 2018-04-18 ENCOUNTER — Other Ambulatory Visit: Payer: Self-pay

## 2018-04-18 VITALS — BP 126/93 | HR 87

## 2018-04-18 DIAGNOSIS — E785 Hyperlipidemia, unspecified: Secondary | ICD-10-CM

## 2018-04-18 DIAGNOSIS — I10 Essential (primary) hypertension: Secondary | ICD-10-CM | POA: Diagnosis not present

## 2018-04-18 DIAGNOSIS — I639 Cerebral infarction, unspecified: Secondary | ICD-10-CM

## 2018-04-18 NOTE — Patient Instructions (Signed)
Continue clopidogrel 75 mg daily  and lipitor  for secondary stroke prevention - request on going refills by PCP  Continue to follow up with PCP regarding cholesterol and blood pressure management -request repeat lipid panel at follow-up visit to ensure adequate management with use of statin  Continue stay active and maintain a healthy diet  Continue to follow with Dr. Posey Pronto for use of amantadine and gabapentin  Continue to monitor blood pressure at home  Maintain strict control of hypertension with blood pressure goal below 130/90, diabetes with hemoglobin A1c goal below 6.5% and cholesterol with LDL cholesterol (bad cholesterol) goal below 70 mg/dL. I also advised the patient to eat a healthy diet with plenty of whole grains, cereals, fruits and vegetables, exercise regularly and maintain ideal body weight.        Thank you for coming to see [place] at [facility]. I hope we have been able to provide you high quality care today.  You may receive a patient satisfaction survey over the next few weeks. We would appreciate your feedback and comments so that we may continue to improve ourselves and the health of our patients.

## 2018-04-18 NOTE — Progress Notes (Signed)
[facility] [address] [place]. [place] [address] [phone]       VIRTUAL VISIT FOLLOW UP NOTE  [name] Date of Birth:  1960-08-23 Medical Record Number:  [id number]   Reason for visit: Stroke follow up   Virtual Visit via Video Note  I connected with Robert Sandoval on 04/18/18 at  9:45 AM EDT by a video enabled telemedicine application with provider located remotely within her own home and verified that I am speaking with the correct person using two identifiers who is also located within his own home.   I discussed the limitations of evaluation and management by telemedicine and the availability of in person appointments. The patient expressed understanding and agreed to proceed.    CHIEF COMPLAINT:  Chief Complaint  Patient presents with   Follow-up    stroke follow up    HPI: Robert Sandoval is being seen today in the office for right basal ganglia infarct status post IV TPA on 06/04/2017. History obtained from patient and chart review. Reviewed all radiology images and labs personally.  [name] a 58 y.o.malewith history of HTN and HLD who was admitted for left arm and leg weakness.  CT head reviewed and negative for acute abnormality and therefore TPA was given on 06/05/2017 at 0256. Tolerated tPA without difficulty with resultant left hemiparesis.  MRI head reviewed and showed right BG/CR infarct likely related to small vessel disease. CTA head neck unremarkable.  2D echo showed an EF of 60 to 65%.  LDL 87 and recommended to start Lipitor 20 mg daily.  Patient was on aspirin 81 mg daily PTA and recommended DAPT for 3 weeks and then Plavix alone.  Patient was discharged to [facility] for continued therapies in stable condition.  07/17/17 visit: Patient is being seen today for hospital follow-up.  He continues to have left hemiparesis with numbness and tingling but this has been improving.  He continues PT/OT at our neuro rehab clinic [place]-door.  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  He has been taken out of work by his PCP until mid August and continues to feel at this time that he is not able to return to work due to continued fatigue that can make his symptoms worse especially when he drives 921+ miles.  He feels as though with the continuation of therapies he will be ready to return to work by mid August.  He has stopped taking aspirin as he completed 3 weeks of DAPT but was continuing Plavix up until 2 days ago when he ran out of the prescription but prior to this he denies bleeding or bruising.  Recently ran out of Lipitor prescription 2 days ago but denied side effects of myalgias.  Blood pressure mildly elevated at today's appointment at 144/91 but does check this at home and is typically lower.  He states he is trying to stay active and eat healthy.  Denies new or worsening stroke/TIA symptoms.  Interval history 10/17/2017: Patient is being seen today for scheduled follow-up visit.  He does continue to have mild left upper extremity weakness along with some numbness and tingling but this has been improving.  He has returned to work full-time and does state towards the end of the day he does have increased fatigue but this also has been improving.  He was started on amantadine by Dr. Posey Pronto to help with his excessive fatigue post stroke which patient believes has  been helping him.  He does state that towards the end of the day when he is more fatigued or after increased activity, he can have slightly worsening left upper extremity weakness along with worsening of left ankle dorsiflexion weakness which at times makes it difficult for him to ambulate up a flight of stairs.  He continues to t have Ake Plavix without side effects of bleeding or bruising.  Continues to take Lipitor without side effects of myalgias.  Blood pressure today satisfactory 123/83.  He continues to exercise daily along with eating a healthy  diet.  Denies new or worsening stroke/TIA symptoms.  [name] continues to do well from a stroke standpoint with residual deficit of reported left ankle dorsiflexion weakness, left hand dexterity weakness, left fingertip decreased sensation and post stroke fatigue.  All reported deficits are very mild and he feels as though he is almost back to "100%".  He continues to exercise daily along with continuing working for [employer] and is able to do this without difficulty.  He continues on amantadine for fatigue which she continues to endorse benefit (prescribed by Dr. Posey Pronto).  He continues on clopidogrel without side effects of bleeding or bruising.  Continues on atorvastatin without side effects myalgias.  Lipid panel has not been recently obtained and plans on scheduling appointment with PCP for follow-up lab work.  He does monitor BP at home which has been stable around 120/90s and he did check BP during visit with level at 126/93.  No further concerns at this time.  Denies new or worsening stroke/TIA symptoms.   ROS:   14 system review of systems performed and negative with exception of fatigue and numbness  PMH:  Past Medical History:  Diagnosis Date   Allergy    BPH (benign prostatic hyperplasia)    followed by [facility]   Hyperlipidemia    Hypertension    Low back pain radiating to left lower extremity    sees Dr. Tonita Cong   Stroke [facility])     PSH: No past surgical history on file.  Social History:  Social History   Socioeconomic History   Marital status: Married    Spouse name: Not on file   Number of children: 1   Years of education: Not on file   Highest education level: High school graduate  Occupational History   Not on file  Social Needs   Financial resource strain: Not on file   Food insecurity:    Worry: Not on file    Inability: Not on file   Transportation needs:    Medical: Not on file    Non-medical: Not on file  Tobacco Use    Smoking status: Never Smoker   Smokeless tobacco: Never Used  Substance and Sexual Activity   Alcohol use: Yes    Comment: none since he had the stroke. He used to drink max of 2 beers per weekend   Drug use: No   Sexual activity: Not on file  Lifestyle   Physical activity:    Days per week: Not on file    Minutes per session: Not on file   Stress: Not on file  Relationships   Social connections:    Talks on phone: Not on file    Gets together: Not on file    Attends religious service: Not on file    Active member of club or organization: Not on file    Attends meetings of clubs or organizations: Not on file  Relationship status: Not on file   Intimate partner violence:    Fear of current or ex partner: Not on file    Emotionally abused: Not on file    Physically abused: Not on file    Forced sexual activity: Not on file  Other Topics Concern   Not on file  Social History Narrative   Lives at home with his wife   Right handed   Caffeine: 1 cup of coffee in the mornings     Family History:  Family History  Problem Relation Age of Onset   Hyperlipidemia Father    Asthma Father     Medications:   Current Outpatient Medications on File Prior to Visit  Medication Sig Dispense Refill   amantadine (SYMMETREL) 100 MG capsule Take 2 capsules (200 mg total) by mouth 2 (two) times daily with breakfast and lunch for 30 days. 120 capsule 1   atorvastatin (LIPITOR) 20 MG tablet Take 1 tablet (20 mg total) by mouth daily at 6 PM. 90 tablet 0   clopidogrel (PLAVIX) 75 MG tablet Take 1 tablet (75 mg total) by mouth daily. 90 tablet 4   fluticasone (FLONASE) 50 MCG/ACT nasal spray Place 1 spray into both nostrils daily.  2   gabapentin (NEURONTIN) 600 MG tablet TAKE 1 TABLET BY MOUTH THREE TIMES A DAY 270 tablet 1   lisinopril (PRINIVIL,ZESTRIL) 10 MG tablet TAKE 1 TABLET BY MOUTH EVERY DAY 90 tablet 0   loratadine (CLARITIN) 10 MG tablet Take 1 tablet (10 mg total)  by mouth daily.     polyethylene glycol (MIRALAX / GLYCOLAX) packet Take 17 g by mouth 2 (two) times daily. 14 each 0   tamsulosin (FLOMAX) 0.4 MG CAPS capsule Take 0.4 mg by mouth daily.     No current facility-administered medications on file prior to visit.     Allergies:  No Known Allergies   Physical Exam  Vitals:   04/18/18 0955  BP: (!) 126/93  Pulse: 87   *Vitals obtained by patient with his own device*  General: well developed, well nourished, pleasant middle-aged Hispanic male, seated, in no evident distress Head: head normocephalic and atraumatic.     Neurologic Exam Mental Status: Awake and fully alert. Oriented to place and time. Recent and remote memory intact. Attention span, concentration and fund of knowledge appropriate. Mood and affect appropriate.  Cranial Nerves: Extraocular movements full without nystagmus. Hearing intact to voice.  Shoulder shrug symmetric.  Face, tongue, palate moves normally and symmetrically.  Motor: Decreased left hand finger tapping but no evidence of pronator drift in BUE.  No evidence of drift in BLE and is able to heel walk and plantar walk without difficulty Sensory.:  Unable to assess Coordination: Finger-to-nose and heel-to-shin performed accurately on right side.  Orbits right arm over left arm. Gait and Station: Arises from chair without difficulty. Stance is normal. Gait demonstrates normal stride length and balance .       Diagnostic Data (Labs, Imaging, Testing)  CT HEAD WO CONTRAST 06/05/2017 IMPRESSION: 1. No acute intracranial infarct or other abnormality identified. 2. ASPECTS is 10.  CT ANGIO HEAD W OR WO CONTRAST CTA NGIO NECK W OR WO CONTRAST 06/05/2016 IMPRESSION: Normal CTA of the head and neck with no evidence for emergent large vessel occlusion. No significant atheromatous disease for patient age. No hemodynamically significant or correctable stenosis.  MR BRAIN WO  CONTRAST 06/06/2017 IMPRESSION: 1. Acute RIGHT basal ganglia/corona radiata infarct. Tiny RIGHT acute occipital posterior watershed territory  infarcts. 2. Otherwise negative noncontrast MRI of the head for age.  ECHOCARDIOGRAM 06/05/2017 Study Conclusions - Left ventricle: The cavity size was normal. Wall thickness was   normal. Systolic function was normal. The estimated ejection   fraction was in the range of 60% to 65%. Wall motion was normal;   there were no regional wall motion abnormalities. Doppler   parameters are consistent with abnormal left ventricular   relaxation (grade 1 diastolic dysfunction). - Aortic valve: There was mild to moderate regurgitation. - Pulmonary arteries: Systolic pressure was mildly increased. PA   peak pressure: 31 mm Hg (S). Impressions: - No cardiac source of emboli was indentified.   ASSESSMENT: Gerado Nabers is a 58 y.o. 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 Vascular risk factors include HTN and HLD.  Patient has been stable from a stroke standpoint with residual deficit of mild left hand decreased dexterity, subjective numbness and post stroke fatigue but he feels almost "100%" back to normal.    PLAN: -Continue clopidogrel 75 mg daily  and lipitor  for secondary stroke prevention -Request PCP refill prescriptions ongoing as patient will need to follow-up in our office as needed -F/u with PCP regarding your HLD and HTN management -request lipid panel repeat at follow-up visit as it does not appear to have been obtained since 07/2017 -f/u with Dr. Posey Pronto as scheduled along with ongoing use of amantadine and gabapentin -continue to monitor BP at home -Recommended to continue daily exercise routine along with maintaining a healthy diet  -Maintain strict control of hypertension with blood pressure goal below 130/90, diabetes with hemoglobin A1c goal below 6.5% and cholesterol with LDL cholesterol (bad  cholesterol) goal below 70 mg/dL. I also advised the patient to eat a healthy diet with plenty of whole grains, cereals, fruits and vegetables, exercise regularly and maintain ideal body weight.  Follow-up in our office as needed but advised to call with any questions or concerns regarding his stroke.  Patient verbalized understanding and is in agreement to plan.  Greater than 50% of time during this 25 minute visit was spent on counseling,explanation of diagnosis of right BG/CR infarct, reviewing risk factor management of HLD and HTN, planning of further management, discussion with patient and family and coordination of care    [name], [facility]  [facility] [address] [place], [place] [address]  Phone [phone] Fax [phone]

## 2018-04-18 NOTE — Progress Notes (Signed)
I agree with the above plan 

## 2018-05-10 ENCOUNTER — Other Ambulatory Visit: Payer: Self-pay

## 2018-05-10 ENCOUNTER — Encounter: Payer: Self-pay | Admitting: Physical Medicine & Rehabilitation

## 2018-05-10 ENCOUNTER — Encounter: Payer: 59 | Attending: Physical Medicine & Rehabilitation | Admitting: Physical Medicine & Rehabilitation

## 2018-05-10 VITALS — BP 125/93 | HR 72 | Ht 68.0 in | Wt 163.0 lb

## 2018-05-10 DIAGNOSIS — R209 Unspecified disturbances of skin sensation: Secondary | ICD-10-CM

## 2018-05-10 DIAGNOSIS — I69398 Other sequelae of cerebral infarction: Secondary | ICD-10-CM

## 2018-05-10 DIAGNOSIS — R269 Unspecified abnormalities of gait and mobility: Secondary | ICD-10-CM | POA: Insufficient documentation

## 2018-05-10 DIAGNOSIS — M545 Low back pain: Secondary | ICD-10-CM | POA: Insufficient documentation

## 2018-05-10 DIAGNOSIS — I10 Essential (primary) hypertension: Secondary | ICD-10-CM | POA: Insufficient documentation

## 2018-05-10 DIAGNOSIS — I639 Cerebral infarction, unspecified: Secondary | ICD-10-CM | POA: Diagnosis not present

## 2018-05-10 DIAGNOSIS — I69354 Hemiplegia and hemiparesis following cerebral infarction affecting left non-dominant side: Secondary | ICD-10-CM | POA: Insufficient documentation

## 2018-05-10 DIAGNOSIS — I6381 Other cerebral infarction due to occlusion or stenosis of small artery: Secondary | ICD-10-CM

## 2018-05-10 DIAGNOSIS — N400 Enlarged prostate without lower urinary tract symptoms: Secondary | ICD-10-CM | POA: Insufficient documentation

## 2018-05-10 DIAGNOSIS — R5383 Other fatigue: Secondary | ICD-10-CM | POA: Insufficient documentation

## 2018-05-10 DIAGNOSIS — E785 Hyperlipidemia, unspecified: Secondary | ICD-10-CM | POA: Insufficient documentation

## 2018-05-10 NOTE — Progress Notes (Signed)
Subjective:    Patient ID: Robert Sandoval, male    DOB: 06/22/1960, 58 y.o.   MRN: [id number]  TELEHEALTH NOTE  Due to national recommendations of social distancing due to COVID 19, an audio/video telehealth visit is felt to be most appropriate for this patient at this time.  See Chart message from today for the patient's consent to telehealth from [place].     I verified that I am speaking with the correct person using two identifiers.  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  Last clinic visit 03/29/2018.  Since that time, he notes improvement in fatigue. He continues to have dysesthesias, but it is minimal.  He continues to do HEP. He saw Neurology, who noted good progress and was released. He is tolerating increased amantadine with benefit. Denies falls.   Pain Inventory Average Pain 0 Pain Right Now 0 My pain is na  In the last 24 hours, has pain interfered with the following? General activity 8 Relation with others 10 Enjoyment of life 9 What TIME of day is your pain at its worst? na Sleep (in general) Good  Pain is worse with: na Pain improves with: na Relief from Meds: na  Mobility walk without assistance how many minutes can you walk? 50 ability to climb steps?  yes do you drive?  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  no   Physicians involved in your care Any changes since last visit?  no   Family History  Problem Relation Age of Onset  . Hyperlipidemia Father   . Asthma Father    Social History   Socioeconomic History  . Marital status: Married    Spouse name: Not on file  . Number of children: 1  . Years of education: Not on file  . Highest education level: High school graduate  Occupational History  . Not on file  Social Needs  . Financial resource strain: Not on file  . Food insecurity:    Worry: Not on file    Inability: Not on file  . Transportation needs:    Medical: Not on file    Non-medical: Not on file  Tobacco Use  . Smoking status: Never Smoker  . Smokeless tobacco: Never Used  Substance and Sexual Activity  . Alcohol use: Yes    Comment: none since he had the stroke. He used to drink max of 2 beers per weekend  . Drug use: No  . Sexual activity: Not on file  Lifestyle  . Physical activity:    Days per week: Not on file    Minutes per session: Not on file  . Stress: Not on file  Relationships  . Social connections:    Talks on phone: Not on file    Gets together: Not on file    Attends religious service: Not on file    Active member of club or organization: Not on file    Attends meetings of clubs or organizations:  Not on file    Relationship status: Not on file  Other Topics Concern  . Not on file  Social History Narrative   Lives at home with his wife   Right handed   Caffeine: 1 cup of coffee in the mornings    History reviewed. No pertinent surgical history. Past Medical History:  Diagnosis Date  . Allergy   . BPH (benign prostatic hyperplasia)    followed by [facility]  . Hyperlipidemia   . Hypertension   . Low back pain radiating to left lower extremity    sees Dr. Tonita Cong  . Stroke ([facility])    BP (!) 125/93   Pulse 72   Ht 5\' 8"  (1.727 m)   Wt 163 lb (73.9 kg)   BMI 24.78 kg/m   Opioid Risk Score:   Fall Risk Score:  `1  Depression  screen PHQ 2/9  Depression screen [facility] 2/9 03/29/2018 09/07/2017 07/26/2017 06/22/2017 06/19/2017 08/05/2016 07/13/2015  Decreased Interest 0 0 0 0 0 0 0  Down, Depressed, Hopeless 0 0 0 0 0 0 0  PHQ - 2 Score 0 0 0 0 0 0 0   Review of Systems  Constitutional: Negative.   HENT: Negative.   Eyes: Negative.   Respiratory: Negative.   Cardiovascular: Negative.   Gastrointestinal: Negative.   Endocrine: Negative.   Genitourinary: Negative.   Musculoskeletal: Negative.   Skin: Negative.   Allergic/Immunologic: Negative.   Neurological: Positive for weakness and numbness.       Tingling  Hematological: Negative.   Psychiatric/Behavioral: Negative.   All other systems reviewed and are negative.     Objective:   Physical Exam Gen: NAD. Pulm: Effort normal Neuro: Alert and oriented    Assessment & Plan:  58 year old right-handed male of [place] descent with history of hypertension, hyperlipidemia, low back pain with left leg extremity radiculopathy, presents for follow up for right basal ganglia/corona radiata and occipital infracts.  1.  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   Has returned to work without difficulty

## 2018-06-19 ENCOUNTER — Other Ambulatory Visit: Payer: Self-pay | Admitting: Physical Medicine & Rehabilitation

## 2018-06-28 ENCOUNTER — Ambulatory Visit: Payer: Self-pay | Admitting: *Deleted

## 2018-06-28 NOTE — Telephone Encounter (Signed)
Patient thinks he may have sinus infection- or possible COVID exposure- he is calling for appointment. Call to office for appointment- they do not have anything until Tuesday- recommend UC for patient.  Reason for Disposition . HIGH RISK patient (e.g., age > 66 years, diabetes, heart or lung disease, weak immune system)  Answer Assessment - Initial Assessment Questions 1. COVID-19 DIAGNOSIS: "Who made your Coronavirus (COVID-19) diagnosis?" "Was it confirmed by a positive lab test?" If not diagnosed by a HCP, ask "Are there lots of cases (community spread) where you live?" (See public health department website, if unsure)     Patient is calling with concerns of symptoms 2. ONSET: "When did the COVID-19 symptoms start?"      Started last night- headache, dry cough, fever-98.7, joint pain 3. WORST SYMPTOM: "What is your worst symptom?" (e.g., cough, fever, shortness of breath, muscle aches)     Pain in legs 4. COUGH: "Do you have a cough?" If so, ask: "How bad is the cough?"       Dry cough- not too bad now 5. FEVER: "Do you have a fever?" If so, ask: "What is your temperature, how was it measured, and when did it start?"     No fever- 98.7 6. RESPIRATORY STATUS: "Describe your breathing?" (e.g., shortness of breath, wheezing, unable to speak)      No problems with breathing 7. BETTER-SAME-WORSE: "Are you getting better, staying the same or getting worse compared to yesterday?"  If getting worse, ask, "In what way?"     Yesterday- worse- increased symptoms 8. HIGH RISK DISEASE: "Do you have any chronic medical problems?" (e.g., asthma, heart or lung disease, weak immune system, etc.)     Heart disease  9. PREGNANCY: "Is there any chance you are pregnant?" "When was your last menstrual period?"     n/a 10. OTHER SYMPTOMS: "Do you have any other symptoms?"  (e.g., chills, fatigue, headache, loss of smell or taste, muscle pain, sore throat)       Headache, joint pain, fatigue  Protocols used:  CORONAVIRUS (COVID-19) DIAGNOSED OR SUSPECTED-A-AH

## 2018-07-01 ENCOUNTER — Telehealth: Payer: Self-pay | Admitting: Family Medicine

## 2018-07-01 NOTE — Telephone Encounter (Signed)
Copied from [place] [id number]. Topic: General - Inquiry >> Jul 01, 2018 11:27 AM Virl Axe D wrote: Reason for CRM: Pt tested positive for Covid-19. He would like to know if he should keep taking his medications like normal. Please advise. HT#[phone]

## 2018-07-01 NOTE — Telephone Encounter (Signed)
Tried to call pt line busy will try again later.

## 2018-07-02 NOTE — Telephone Encounter (Signed)
Spoke with pt and informed him to continue taking medication as prescribed and if he has anymore questions to give our office a call, he verbalized understanding.

## 2018-07-05 ENCOUNTER — Other Ambulatory Visit: Payer: Self-pay | Admitting: Family Medicine

## 2018-07-05 NOTE — Telephone Encounter (Signed)
SPOKE TO PATIENT ADV MEDICATION REFILLED AND MADE APPNT

## 2018-07-05 NOTE — Telephone Encounter (Signed)
Forwarding medication refill to PCP for review. 

## 2018-07-05 NOTE — Telephone Encounter (Signed)
Refilled, but needs office visit in next 6 weeks if possible- repeat labs needed.

## 2018-07-08 ENCOUNTER — Other Ambulatory Visit: Payer: Self-pay | Admitting: Family Medicine

## 2018-07-08 DIAGNOSIS — I10 Essential (primary) hypertension: Secondary | ICD-10-CM

## 2018-07-08 NOTE — Telephone Encounter (Signed)
Pt was given enough refills to get them to their appointment.

## 2018-07-09 ENCOUNTER — Ambulatory Visit: Payer: Self-pay

## 2018-07-09 ENCOUNTER — Encounter (HOSPITAL_COMMUNITY): Payer: Self-pay | Admitting: Family Medicine

## 2018-07-09 ENCOUNTER — Emergency Department (HOSPITAL_COMMUNITY)
Admission: EM | Admit: 2018-07-09 | Discharge: 2018-07-09 | Disposition: A | Discharge status: Home / Self Care | Payer: 59 | Attending: Emergency Medicine | Admitting: Emergency Medicine

## 2018-07-09 ENCOUNTER — Emergency Department (HOSPITAL_COMMUNITY): Payer: 59

## 2018-07-09 DIAGNOSIS — R05 Cough: Secondary | ICD-10-CM | POA: Diagnosis present

## 2018-07-09 DIAGNOSIS — Z7902 Long term (current) use of antithrombotics/antiplatelets: Secondary | ICD-10-CM | POA: Insufficient documentation

## 2018-07-09 DIAGNOSIS — U071 COVID-19: Secondary | ICD-10-CM | POA: Insufficient documentation

## 2018-07-09 DIAGNOSIS — I10 Essential (primary) hypertension: Secondary | ICD-10-CM | POA: Diagnosis not present

## 2018-07-09 DIAGNOSIS — Z79899 Other long term (current) drug therapy: Secondary | ICD-10-CM | POA: Diagnosis not present

## 2018-07-09 DIAGNOSIS — Z8673 Personal history of transient ischemic attack (TIA), and cerebral infarction without residual deficits: Secondary | ICD-10-CM | POA: Diagnosis not present

## 2018-07-09 NOTE — ED Notes (Signed)
Bed: WLPT4 Expected date:  Expected time:  Means of arrival:  Comments: 1640 to clean for EVS

## 2018-07-09 NOTE — Telephone Encounter (Signed)
Spoke with pt and advised him to go to the ER since his symptoms are getting worst not better. He verbalized understanding and states he will go to ER.

## 2018-07-09 NOTE — ED Notes (Signed)
Bed: WLPT1 Expected date:  Expected time:  Means of arrival:  Comments: 

## 2018-07-09 NOTE — Telephone Encounter (Signed)
Pt. Called to report increased cough with known positive COVID 19 diagnosis. Reported onset of symptoms on 6/11, and tested positive on 6/14, at [place].  Reported he has had increase in frequency of cough, in past 2 days.  Stated he had "upper chest pain, near throat yesterday for several hours."  Today the chest pain is gone, but he stated he has "a frequent urge to cough." Denied any shortness of breath.  Temp. 98.7 last night.(Mon.)   Denied feeling feverish today.  Reported last elevated temp of 99.0 degrees on Sunday evening.  Spoke with Health Department, and was advised to call his PCP to report worsening symptoms.  Pt. Questioned if he needs to have a Chest xray done.  Care Advice given per protocol.  Verb. Understanding.  Office closed 12:00-1:00.  Advised will send Triage note to office and expect return call back early afternoon.  Advised to call back if has not rec'd return call by 2:30 PM.  Pt. Asked about the Benzonatate that was prescribed by [facility]; questions if he should be suppressing the cough?     Reason for Disposition . Chest pain or pressure  Answer Assessment - Initial Assessment Questions 1. COVID-19 DIAGNOSIS: "Who made your Coronavirus (COVID-19) diagnosis?" "Was it confirmed by a positive lab test?" If not diagnosed by a HCP, ask "Are there lots of cases (community spread) where you live?" (See public health department website, if unsure)     Tested positive at [facility] 2. ONSET: "When did the COVID-19 symptoms start?"      06/27/18 3. WORST SYMPTOM: "What is your worst symptom?" (e.g., cough, fever, shortness of breath, muscle aches)     Sensation that needs to cough continually 4. COUGH: "Do you have a cough?" If so, ask: "How bad is the cough?"       Yes, frequency has increased in past 24-48 hrs.  5. FEVER: "Do you have a fever?" If so, ask: "What is your temperature, how was it measured, and when did it start?"    Last temperature was 99 degrees on  Sunday night; temp. Monday night 98.7 6. RESPIRATORY STATUS: "Describe your breathing?" (e.g., shortness of breath, wheezing, unable to speak)      Heaviness in chest 7. BETTER-SAME-WORSE: "Are you getting better, staying the same or getting worse compared to yesterday?"  If getting worse, ask, "In what way?"     Feels worse over last 2 days; urge to cough with some chest discomfort  8. HIGH RISK DISEASE: "Do you have any chronic medical problems?" (e.g., asthma, heart or lung disease, weak immune system, etc.)    Denied asthma, DM.  Positive for HTN, high cholesterol, and hx CVA 9. PREGNANCY: "Is there any chance you are pregnant?" "When was your last menstrual period?"     N/a  10. OTHER SYMPTOMS: "Do you have any other symptoms?"  (e.g., chills, fatigue, headache, loss of smell or taste, muscle pain, sore throat)      Intermittent headache, upper chest discomfort- near the throat  Protocols used: CORONAVIRUS (COVID-19) DIAGNOSED OR SUSPECTED-A-AH

## 2018-07-09 NOTE — ED Provider Notes (Signed)
[facility] DEPT Provider Note   CSN: [id number] Arrival date & time: 07/09/18  1813    History   Chief Complaint Chief Complaint  Patient presents with  . COVID +  . Cough    HPI Robert Sandoval is a 58 y.o. male.     HPI   Diagnosed with COVID 6/14 Has had the cough since he went to the clinic, got tessalon pearls, took them but still have the urge to cough. Dry cough. Still coughing. Cough has worsened in the last 2 days, now productive of white phlegm.  Temperate 99 at home.  No shortness of breath, nausea, vomiting. Initially had headache but that has improved.  Loose stool. Dry mouth. Loss of smell.      Past Medical History:  Diagnosis Date  . Allergy   . BPH (benign prostatic hyperplasia)    followed by [facility]  . Hyperlipidemia   . Hypertension   . Low back pain radiating to left lower extremity    sees Dr. Tonita Cong  . Stroke [facility])     Patient Active Problem List   Diagnosis Date Noted  . Other fatigue 01/03/2018  . Alteration of sensation as late effect of stroke 09/07/2017  . Hypotension due to drugs   . Labile blood pressure   . Benign prostatic hyperplasia   . Hemiparesis affecting left side as late effect of stroke ([place])   . Basal ganglia infarction ([place]) 06/07/2017  . 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 ([place])   . Gait disturbance, post-stroke   . Hyperlipidemia   . Acute ischemic stroke ([place]) - R basal ganglia s/p IV tPA 06/05/2017  . Prostate nodule 10/30/2016  . Essential hypertension 08/05/2016  . External hemorrhoids without complication 08/67/6195  . Melanosis of colon 11/23/2015  . Diverticulosis of colon without hemorrhage 11/23/2015  . Internal hemorrhoids 11/23/2015  . Essential hypertension, benign 09/14/2014    History reviewed. No pertinent surgical history.      Home Medications    Prior to Admission medications   Medication Sig Start Date End Date  Taking? Authorizing Provider  amantadine (SYMMETREL) 100 MG capsule Take 2 capsules (200 mg total) by mouth 2 (two) times daily with breakfast and lunch for 30 days. 06/19/18 07/19/18  Jamse Arn, MD  atorvastatin (LIPITOR) 20 MG tablet TAKE 1 TABLET (20 MG TOTAL) BY MOUTH DAILY AT 6 PM. 07/05/18   [name], MD  clopidogrel (PLAVIX) 75 MG tablet Take 1 tablet (75 mg total) by mouth daily. 09/27/17   [name], NP  fluticasone (FLONASE) 50 MCG/ACT nasal spray Place 1 spray into both nostrils daily. 06/13/17   Love, [name], PA-C  gabapentin (NEURONTIN) 600 MG tablet TAKE 1 TABLET BY MOUTH THREE TIMES A DAY 04/01/18   Jamse Arn, MD  lisinopril (ZESTRIL) 10 MG tablet Take 1 tablet (10 mg total) by mouth daily. MUST KEEP UPCOMING APPT 07/08/18   [name], MD  loratadine (CLARITIN) 10 MG tablet Take 1 tablet (10 mg total) by mouth daily. 06/13/17   Love, [name], PA-C  polyethylene glycol (MIRALAX / GLYCOLAX) packet Take 17 g by mouth 2 (two) times daily. 06/13/17   Love, [name], PA-C  tamsulosin (FLOMAX) 0.4 MG CAPS capsule Take 0.4 mg by mouth daily.    [provider]    Family History Family History  Problem Relation Age of Onset  . Hyperlipidemia Father   . Asthma Father  Social History Social History   Tobacco Use  . Smoking status: Never Smoker  . Smokeless tobacco: Never Used  Substance Use Topics  . Alcohol use: Yes    Comment: Once a month   . Drug use: No     Allergies   Patient has no known allergies.   Review of Systems Review of Systems  Constitutional: Negative for fever.  HENT: Negative for congestion.   Respiratory: Positive for cough. Negative for shortness of breath.   Cardiovascular: Negative for chest pain.  Gastrointestinal: Negative for abdominal pain, nausea and vomiting.  Musculoskeletal: Negative for back pain.  Skin: Negative for rash.  Neurological: Negative for headaches (improved).     Physical  Exam Updated Vital Signs BP (!) 120/91   Pulse 73   Temp 99.1 F (37.3 C) (Oral)   Resp (!) 21   Ht 5\' 8"  (1.727 m)   Wt 71.7 kg   SpO2 97%   BMI 24.02 kg/m   Physical Exam Vitals signs and nursing note reviewed.  Constitutional:      General: He is not in acute distress.    Appearance: He is well-developed. He is not diaphoretic.  HENT:     Head: Normocephalic and atraumatic.  Eyes:     Conjunctiva/sclera: Conjunctivae normal.  Neck:     Musculoskeletal: Normal range of motion.  Cardiovascular:     Rate and Rhythm: Normal rate and regular rhythm.     Heart sounds: Normal heart sounds. No murmur. No friction rub. No gallop.   Pulmonary:     Effort: Pulmonary effort is normal. No respiratory distress.     Breath sounds: Normal breath sounds. No wheezing or rales.  Abdominal:     General: There is no distension.     Palpations: Abdomen is soft.     Tenderness: There is no abdominal tenderness. There is no guarding.  Skin:    General: Skin is warm and dry.  Neurological:     Mental Status: He is alert and oriented to person, place, and time.      ED Treatments / Results  Labs (all labs ordered are listed, but only abnormal results are displayed) Labs Reviewed - No data to display  EKG EKG Interpretation  Date/Time:  Tuesday July 09 2018 18:54:21 EDT Ventricular Rate:  87 PR Interval:    QRS Duration: 95 QT Interval:  356 QTC Calculation: 429 R Axis:   86 Text Interpretation:  Sinus rhythm Nonspecific T abnormalities, lateral leads Baseline wander in lead(s) V1 No significant change since last tracing Confirmed by Gareth Morgan [phone]) on 07/09/2018 7:18:24 PM   Radiology Dg Chest Portable 1 View  Result Date: 07/09/2018 CLINICAL DATA:  58 year old male with cough. EXAM: PORTABLE CHEST 1 VIEW COMPARISON:  Chest radiograph dated 06/05/2017 FINDINGS: The lungs are clear. There is no pleural effusion or pneumothorax. Stable top-normal cardiac silhouette. The  aorta is tortuous. No acute osseous pathology. IMPRESSION: No active disease. Electronically Signed   By: Anner Crete M.D.   On: 07/09/2018 20:39    Procedures Procedures (including critical care time)  Medications Ordered in ED Medications - No data to display   Initial Impression / Assessment and Plan / ED Course  I have reviewed the triage vital signs and the nursing notes.  Pertinent labs & imaging results that were available during my care of the patient were reviewed by me and considered in my medical decision making (see chart for details).  58yo male with history of CVA, htn, hlpd, presents with concern for worsening cough in setting of COVID19.  Reports his PCP sent him here to make sure he does not have pneumonia.  Patient well appearing, no hypoxia, no tachypnea.  XR without abnormalities.  Symptoms secondary to continuing COVID19 infection. Recommend continued self-quarantine, supportive care. Patient discharged in stable condition with understanding of reasons to return.   Final Clinical Impressions(s) / ED Diagnoses   Final diagnoses:  [id number]    ED Discharge Orders    None       Gareth Morgan, MD 07/10/18 2154

## 2018-07-09 NOTE — ED Triage Notes (Signed)
Patient tested COVID + on 06/14. He is complaining of a productive cough that is white, thick mucous. He has spoke with provider and they advised him to come to the ED for further evaluation.

## 2018-08-01 ENCOUNTER — Other Ambulatory Visit: Payer: Self-pay | Admitting: Family Medicine

## 2018-08-01 DIAGNOSIS — I10 Essential (primary) hypertension: Secondary | ICD-10-CM

## 2018-08-15 ENCOUNTER — Encounter: Payer: Self-pay | Admitting: Family Medicine

## 2018-08-15 ENCOUNTER — Encounter: Payer: Self-pay | Admitting: Gastroenterology

## 2018-08-15 ENCOUNTER — Other Ambulatory Visit: Payer: Self-pay

## 2018-08-15 ENCOUNTER — Ambulatory Visit (INDEPENDENT_AMBULATORY_CARE_PROVIDER_SITE_OTHER): Payer: 59 | Admitting: Family Medicine

## 2018-08-15 VITALS — BP 120/82 | HR 64 | Temp 97.6°F | Resp 14 | Wt 155.8 lb

## 2018-08-15 DIAGNOSIS — I6381 Other cerebral infarction due to occlusion or stenosis of small artery: Secondary | ICD-10-CM

## 2018-08-15 DIAGNOSIS — K5909 Other constipation: Secondary | ICD-10-CM

## 2018-08-15 DIAGNOSIS — I10 Essential (primary) hypertension: Secondary | ICD-10-CM | POA: Diagnosis not present

## 2018-08-15 DIAGNOSIS — I639 Cerebral infarction, unspecified: Secondary | ICD-10-CM

## 2018-08-15 DIAGNOSIS — K644 Residual hemorrhoidal skin tags: Secondary | ICD-10-CM | POA: Diagnosis not present

## 2018-08-15 DIAGNOSIS — E785 Hyperlipidemia, unspecified: Secondary | ICD-10-CM

## 2018-08-15 DIAGNOSIS — Z23 Encounter for immunization: Secondary | ICD-10-CM

## 2018-08-15 DIAGNOSIS — Z1159 Encounter for screening for other viral diseases: Secondary | ICD-10-CM

## 2018-08-15 MED ORDER — ATORVASTATIN CALCIUM 20 MG PO TABS: 20.0000 mg | ORAL_TABLET | Freq: Every day | ORAL | 2 refills | Status: DC

## 2018-08-15 MED ORDER — CLOPIDOGREL BISULFATE 75 MG PO TABS: 75.0000 mg | ORAL_TABLET | Freq: Every day | ORAL | 3 refills | Status: DC

## 2018-08-15 MED ORDER — LISINOPRIL 10 MG PO TABS: 10.0000 mg | ORAL_TABLET | Freq: Every day | ORAL | 2 refills | Status: DC

## 2018-08-15 MED ORDER — HYDROCORTISONE ACETATE 25 MG RE SUPP: 25.0000 mg | Freq: Two times a day (BID) | RECTAL | 0 refills | Status: DC

## 2018-08-15 NOTE — Progress Notes (Signed)
Subjective:    Patient ID: Robert Sandoval, male    DOB: October 16, 1960, 58 y.o.   MRN: [id number]  HPI Robert Sandoval is a 58 y.o. male Presents today for: Chief Complaint  Patient presents with  . Hyperlipidemia     here for a 6 week f/u  . Hemorrhoids    would like a refill on his hemorrhoid cream   Hypertension: BP Readings from Last 3 Encounters:  08/15/18 120/82  07/09/18 (!) 120/91  05/10/18 (!) 125/93   Lab Results  Component Value Date   CREATININE 0.82 08/29/4816  Complicated by recent right basal ganglia infarct status post IV TPA on Jun 04, 2017.  Resultant left hemiparesis.  Most recent evaluation by neurology April 2, slight left ankle dorsiflexion weakness, slight left hand dexterity weakness left fingertip decreased sensation and posterior fatigue.  All reported deficits were very mild and he feels that he was almost back to 100% at that time.  Was prescribed amantadine for fatigue, followed by Dr. Posey Pronto.  Continued on Plavix, atorvastatin and reported home blood pressures 120/90 range.  Plan for prescription refills remain with PRN follow-up with neurology.  Amantadine and gabapentin through Dr. Posey Pronto with physical medicine and rehab.  Goal BP less than 130/90, LDL under 70, A1c below 6.5. PM&R appointment April 24, continue amantadine 200 mg every morning and with lunch for fatigue, gabapentin 600 mg 3 times daily for dysesthesias, follow-up in 6 months.   Still doing well. No new deficits. Feels well. 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   On lisinopril 10 mg daily for blood pressure, no new side effects.  No new bleeding, no hematuria.  Home BP: 128/80.   Hemorrhoids: Once per month. Last 3-4 days. Treated with warm soaks, or otc suppository.  Occasional constipation. Taking miralax once per day for past year which has helped. Still constipated at times.   Covid-19 Diagnosed 6/14 - CXR ok 6/23. Has been doing ok. No further cough/congestion -  feels well.   Hyperlipidemia:  Lab Results  Component Value Date   CHOL 128 07/26/2017   HDL 33 (L) 07/26/2017   LDLCALC 68 07/26/2017   TRIG 133 07/26/2017   CHOLHDL 3.9 07/26/2017   Lab Results  Component Value Date   ALT 19 06/08/2017   AST 21 06/08/2017   ALKPHOS 73 06/08/2017   BILITOT 1.4 (H) 06/08/2017   Lipitor 20 mg daily.due for labs today. No new myalgias or side effects.  Fasting today.    Patient Active Problem List   Diagnosis Date Noted  . Other fatigue 01/03/2018  . Alteration of sensation as late effect of stroke 09/07/2017  . Hypotension due to drugs   . Labile blood pressure   . Benign prostatic hyperplasia   . Hemiparesis affecting left side as late effect of stroke ([place])   . Basal ganglia infarction ([place]) 06/07/2017  . 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 ([place])   . Gait disturbance, post-stroke   . Hyperlipidemia   . Acute ischemic stroke ([place]) - R basal ganglia s/p IV tPA 06/05/2017  . Prostate nodule 10/30/2016  . Essential hypertension 08/05/2016  . External hemorrhoids without complication 56/31/4970  . Melanosis of colon 11/23/2015  . Diverticulosis of colon without hemorrhage 11/23/2015  . Internal hemorrhoids 11/23/2015  . Essential hypertension, benign 09/14/2014   Past Medical History:  Diagnosis Date  . Allergy   . BPH (benign prostatic hyperplasia)    followed by [facility]  .  Hyperlipidemia   . Hypertension   . Low back pain radiating to left lower extremity    sees Dr. Tonita Cong  . Stroke [facility])    No past surgical history on file. No Known Allergies Prior to Admission medications   Medication Sig Start Date End Date Taking? Authorizing Provider  atorvastatin (LIPITOR) 20 MG tablet TAKE 1 TABLET (20 MG TOTAL) BY MOUTH DAILY AT 6 PM. 07/05/18  Yes [name], MD  clopidogrel (PLAVIX) 75 MG tablet Take 1 tablet (75 mg total) by mouth daily. 09/27/17  Yes [name], NP   fluticasone (FLONASE) 50 MCG/ACT nasal spray Place 1 spray into both nostrils daily. 06/13/17  Yes Love, [name], PA-C  gabapentin (NEURONTIN) 600 MG tablet TAKE 1 TABLET BY MOUTH THREE TIMES A DAY 04/01/18  Yes Patel, Domenick Bookbinder, MD  lisinopril (ZESTRIL) 10 MG tablet TAKE 1 TABLET (10 MG TOTAL) BY MOUTH DAILY. MUST KEEP UPCOMING APPT 08/01/18  Yes [name], MD  loratadine (CLARITIN) 10 MG tablet Take 1 tablet (10 mg total) by mouth daily. 06/13/17  Yes Love, [name], PA-C  polyethylene glycol (MIRALAX / GLYCOLAX) packet Take 17 g by mouth 2 (two) times daily. 06/13/17  Yes Love, [name], PA-C  tamsulosin (FLOMAX) 0.4 MG CAPS capsule Take 0.4 mg by mouth daily.   Yes [provider]  amantadine (SYMMETREL) 100 MG capsule Take 2 capsules (200 mg total) by mouth 2 (two) times daily with breakfast and lunch for 30 days. 06/19/18 07/19/18  Jamse Arn, MD   Social History   Socioeconomic History  . Marital status: Married    Spouse name: Not on file  . Number of children: 1  . Years of education: Not on file  . Highest education level: High school graduate  Occupational History  . Not on file  Social Needs  . Financial resource strain: Not on file  . Food insecurity    Worry: Not on file    Inability: Not on file  . Transportation needs    Medical: Not on file    Non-medical: Not on file  Tobacco Use  . Smoking status: Never Smoker  . Smokeless tobacco: Never Used  Substance and Sexual Activity  . Alcohol use: Yes    Comment: Once a month   . Drug use: No  . Sexual activity: Not on file  Lifestyle  . Physical activity    Days per week: Not on file    Minutes per session: Not on file  . Stress: Not on file  Relationships  . Social Herbalist on phone: Not on file    Gets together: Not on file    Attends religious service: Not on file    Active member of club or organization: Not on file    Attends meetings of clubs or organizations: Not on file     Relationship status: Not on file  . Intimate partner violence    Fear of current or ex partner: Not on file    Emotionally abused: Not on file    Physically abused: Not on file    Forced sexual activity: Not on file  Other Topics Concern  . Not on file  Social History Narrative   Lives at home with his wife   Right handed   Caffeine: 1 cup of coffee in the mornings     Review of Systems  Constitutional: Negative for fatigue and unexpected weight change.  Eyes: Negative for  visual disturbance.  Respiratory: Negative for cough, chest tightness and shortness of breath.   Cardiovascular: Negative for chest pain, palpitations and leg swelling.  Gastrointestinal: Negative for abdominal pain and blood in stool.  Neurological: Negative for dizziness, light-headedness and headaches.       Objective:   Physical Exam Vitals signs reviewed.  Constitutional:      Appearance: He is well-developed.  HENT:     Head: Normocephalic and atraumatic.  Eyes:     Pupils: Pupils are equal, round, and reactive to light.  Neck:     Vascular: No carotid bruit or JVD.  Cardiovascular:     Rate and Rhythm: Normal rate and regular rhythm.     Heart sounds: Normal heart sounds. No murmur.  Pulmonary:     Effort: Pulmonary effort is normal.     Breath sounds: Normal breath sounds. No rales.  Skin:    General: Skin is warm and dry.  Neurological:     Mental Status: He is alert and oriented to person, place, and time.    Vitals:   08/15/18 1015  BP: 120/82  Pulse: 64  Resp: 14  Temp: 97.6 F (36.4 C)  TempSrc: Oral  SpO2: 98%  Weight: 155 lb 12.8 oz (70.7 kg)          Assessment & Plan:    Robert Sandoval is a 58 y.o. male Hyperlipidemia, unspecified hyperlipidemia type - Plan: atorvastatin (LIPITOR) 20 MG tablet, Lipid Panel, CANCELED: Lipid Panel  -  Stable, tolerating current regimen. Medications refilled. Labs pending as above.   Essential hypertension - Plan: lisinopril (ZESTRIL)  10 MG tablet, Comprehensive metabolic panel, CANCELED: Comprehensive metabolic panel  -  Stable, tolerating current regimen. Medications refilled. Labs pending as above. Goal under 130 with prior CVA.   Need for prophylactic vaccination with combined diphtheria-tetanus-pertussis (DTP) vaccine - Plan: Tdap vaccine greater than or equal to 7yo IM -tdap  External hemorrhoids - Plan: hydrocortisone (ANUSOL-HC) 25 MG suppository, Ambulatory referral to Gastroenterology Chronic constipation  -persistent hemorrhoids, with chronic constipation. Continue miralax, refer to GI and rx for anusol [facility] for hemorrhoids. Handouts given.   Need for hepatitis C screening test - Plan: Hepatitis C antibody, CANCELED: Hepatitis C antibody  Cerebrovascular accident (CVA) of basal ganglia ([place]) - Plan: clopidogrel (PLAVIX) 75 MG tablet  - refilled plavix. Prn follow up with neuro at this time.    Meds ordered this encounter  Medications  . atorvastatin (LIPITOR) 20 MG tablet    Sig: Take 1 tablet (20 mg total) by mouth daily at 6 PM.    Dispense:  90 tablet    Refill:  2  . clopidogrel (PLAVIX) 75 MG tablet    Sig: Take 1 tablet (75 mg total) by mouth daily.    Dispense:  90 tablet    Refill:  3  . lisinopril (ZESTRIL) 10 MG tablet    Sig: Take 1 tablet (10 mg total) by mouth daily.    Dispense:  90 tablet    Refill:  2  . hydrocortisone (ANUSOL-HC) 25 MG suppository    Sig: Place 1 suppository (25 mg total) rectally 2 (two) times daily.    Dispense:  12 suppository    Refill:  0   Patient Instructions   I am glad to hear that she (should read "you are" - typo)  is improved from the COVID infection.  Blood pressure looks okay today, continue same medication and I will check your cholesterol levels.  No change  in dose of meds for now.  I will refer you to gastroenterology to discuss the constipation and hemorrhoids, but okay to continue MiraLAX for now, make sure to drink plenty of fluids, and I did  write for some medication if you do have a flare of hemorrhoids.  See more information below.  Continue follow-up with physical medicine and rehab, and recheck with me in 6 months for a physical.  Let me know if there are questions in the meantime.     Hemorrhoids Hemorrhoids are swollen veins in and around the rectum or anus. There are two types of hemorrhoids:  Internal hemorrhoids. These occur in the veins that are just inside the rectum. They may poke through to the outside and become irritated and painful.  External hemorrhoids. These occur in the veins that are outside the anus and can be felt as a painful swelling or hard lump near the anus. Most hemorrhoids do not cause serious problems, and they can be managed with home treatments such as diet and lifestyle changes. If home treatments do not help the symptoms, procedures can be done to shrink or remove the hemorrhoids. What are the causes? This condition is caused by increased pressure in the anal area. This pressure may result from various things, including:  Constipation.  Straining to have a bowel movement.  Diarrhea.  Pregnancy.  Obesity.  Sitting for long periods of time.  Heavy lifting or other activity that causes you to strain.  Anal sex.  Riding a bike for a long period of time. What are the signs or symptoms? Symptoms of this condition include:  Pain.  Anal itching or irritation.  Rectal bleeding.  Leakage of stool (feces).  Anal swelling.  One or more lumps around the anus. How is this diagnosed? This condition can often be diagnosed through a visual exam. Other exams or tests may also be done, such as:  An exam that involves feeling the rectal area with a gloved hand (digital rectal exam).  An exam of the anal canal that is done using a small tube (anoscope).  A blood test, if you have lost a significant amount of blood.  A test to look inside the colon using a flexible tube with a camera  on the end (sigmoidoscopy or colonoscopy). How is this treated? This condition can usually be treated at home. However, various procedures may be done if dietary changes, lifestyle changes, and other home treatments do not help your symptoms. These procedures can help make the hemorrhoids smaller or remove them completely. Some of these procedures involve surgery, and others do not. Common procedures include:  Rubber band ligation. Rubber bands are placed at the base of the hemorrhoids to cut off their blood supply.  Sclerotherapy. Medicine is injected into the hemorrhoids to shrink them.  Infrared coagulation. A type of light energy is used to get rid of the hemorrhoids.  Hemorrhoidectomy surgery. The hemorrhoids are surgically removed, and the veins that supply them are tied off.  Stapled hemorrhoidopexy surgery. The surgeon staples the base of the hemorrhoid to the rectal wall. Follow these instructions at home: Eating and drinking   Eat foods that have a lot of fiber in them, such as whole grains, beans, nuts, fruits, and vegetables.  Ask your health care provider about taking products that have added fiber (fiber supplements).  Reduce the amount of fat in your diet. You can do this by eating low-fat dairy products, eating less red meat, and avoiding processed foods.  Drink enough fluid to keep your urine pale yellow. Managing pain and swelling   Take warm sitz baths for 20 minutes, 3-4 times a day to ease pain and discomfort. You may do this in a bathtub or using a portable sitz bath that fits over the toilet.  If directed, apply ice to the affected area. Using ice packs between sitz baths may be helpful. ? Put ice in a plastic bag. ? Place a towel between your skin and the bag. ? Leave the ice on for 20 minutes, 2-3 times a day. General instructions  Take over-the-counter and prescription medicines only as told by your health care provider.  Use medicated creams or  suppositories as told.  Get regular exercise. Ask your health care provider how much and what kind of exercise is best for you. In general, you should do moderate exercise for at least 30 minutes on most days of the week (150 minutes each week). This can include activities such as walking, biking, or yoga.  Go to the bathroom when you have the urge to have a bowel movement. Do not wait.  Avoid straining to have bowel movements.  Keep the anal area dry and clean. Use wet toilet paper or moist towelettes after a bowel movement.  Do not sit on the toilet for long periods of time. This increases blood pooling and pain.  Keep all follow-up visits as told by your health care provider. This is important. Contact a health care provider if you have:  Increasing pain and swelling that are not controlled by treatment or medicine.  Difficulty having a bowel movement, or you are unable to have a bowel movement.  Pain or inflammation outside the area of the hemorrhoids. Get help right away if you have:  Uncontrolled bleeding from your rectum. Summary  Hemorrhoids are swollen veins in and around the rectum or anus.  Most hemorrhoids can be managed with home treatments such as diet and lifestyle changes.  Taking warm sitz baths can help ease pain and discomfort.  In severe cases, procedures or surgery can be done to shrink or remove the hemorrhoids. This information is not intended to replace advice given to you by your health care provider. Make sure you discuss any questions you have with your health care provider. Document Released: 12/31/1999 Document Revised: 01/10/2018 Document Reviewed: 05/24/2017 Elsevier Patient Education  2020 [employer].  Constipation, Adult Constipation is when a person has fewer bowel movements in a week than normal, has difficulty having a bowel movement, or has stools that are dry, hard, or larger than normal. Constipation may be caused by an underlying  condition. It may become worse with age if a person takes certain medicines and does not take in enough fluids. Follow these instructions at home: Eating and drinking   Eat foods that have a lot of fiber, such as fresh fruits and vegetables, whole grains, and beans.  Limit foods that are high in fat, low in fiber, or overly processed, such as french fries, hamburgers, cookies, candies, and soda.  Drink enough fluid to keep your urine clear or pale yellow. General instructions  Exercise regularly or as told by your health care provider.  Go to the restroom when you have the urge to go. Do not hold it in.  Take over-the-counter and prescription medicines only as told by your health care provider. These include any fiber supplements.  Practice pelvic floor retraining exercises, such as deep breathing while relaxing the lower abdomen and pelvic  floor relaxation during bowel movements.  Watch your condition for any changes.  Keep all follow-up visits as told by your health care provider. This is important. Contact a health care provider if:  You have pain that gets worse.  You have a fever.  You do not have a bowel movement after 4 days.  You vomit.  You are not hungry.  You lose weight.  You are bleeding from the anus.  You have thin, pencil-like stools. Get help right away if:  You have a fever and your symptoms suddenly get worse.  You leak stool or have blood in your stool.  Your abdomen is bloated.  You have severe pain in your abdomen.  You feel dizzy or you faint. This information is not intended to replace advice given to you by your health care provider. Make sure you discuss any questions you have with your health care provider. Document Released: 10/01/2003 Document Revised: 12/15/2016 Document Reviewed: 06/23/2015 Elsevier Patient Education  [employer].     If you have lab work done today you will be contacted with your lab results within the  next 2 weeks.  If you have not heard from [place] then please contact us. The fastest way to get your results is to register for My Chart.   IF you received an x-ray today, you will receive an invoice from [facility]. Please contact [facility] at [phone] with questions or concerns regarding your invoice.   IF you received labwork today, you will receive an invoice from [place]. Please contact [facility] at [phone] with questions or concerns regarding your invoice.   Our billing staff will not be able to assist you with questions regarding bills from these companies.  You will be contacted with the lab results as soon as they are available. The fastest way to get your results is to activate your My Chart account. Instructions are located on the last page of this paperwork. If you have not heard from [place] regarding the results in 2 weeks, please contact this office.       Signed,   Merri Ray, MD [facility] [place].  08/17/18 10:43 AM

## 2018-08-15 NOTE — Patient Instructions (Addendum)
I am glad to hear that she is improved from the COVID infection.  Blood pressure looks okay today, continue same medication and I will check your cholesterol levels.  No change in dose of meds for now.  I will refer you to gastroenterology to discuss the constipation and hemorrhoids, but okay to continue MiraLAX for now, make sure to drink plenty of fluids, and I did write for some medication if you do have a flare of hemorrhoids.  See more information below.  Continue follow-up with physical medicine and rehab, and recheck with me in 6 months for a physical.  Let me know if there are questions in the meantime.     Hemorrhoids Hemorrhoids are swollen veins in and around the rectum or anus. There are two types of hemorrhoids:  Internal hemorrhoids. These occur in the veins that are just inside the rectum. They may poke through to the outside and become irritated and painful.  External hemorrhoids. These occur in the veins that are outside the anus and can be felt as a painful swelling or hard lump near the anus. Most hemorrhoids do not cause serious problems, and they can be managed with home treatments such as diet and lifestyle changes. If home treatments do not help the symptoms, procedures can be done to shrink or remove the hemorrhoids. What are the causes? This condition is caused by increased pressure in the anal area. This pressure may result from various things, including:  Constipation.  Straining to have a bowel movement.  Diarrhea.  Pregnancy.  Obesity.  Sitting for long periods of time.  Heavy lifting or other activity that causes you to strain.  Anal sex.  Riding a bike for a long period of time. What are the signs or symptoms? Symptoms of this condition include:  Pain.  Anal itching or irritation.  Rectal bleeding.  Leakage of stool (feces).  Anal swelling.  One or more lumps around the anus. How is this diagnosed? This condition can often be  diagnosed through a visual exam. Other exams or tests may also be done, such as:  An exam that involves feeling the rectal area with a gloved hand (digital rectal exam).  An exam of the anal canal that is done using a small tube (anoscope).  A blood test, if you have lost a significant amount of blood.  A test to look inside the colon using a flexible tube with a camera on the end (sigmoidoscopy or colonoscopy). How is this treated? This condition can usually be treated at home. However, various procedures may be done if dietary changes, lifestyle changes, and other home treatments do not help your symptoms. These procedures can help make the hemorrhoids smaller or remove them completely. Some of these procedures involve surgery, and others do not. Common procedures include:  Rubber band ligation. Rubber bands are placed at the base of the hemorrhoids to cut off their blood supply.  Sclerotherapy. Medicine is injected into the hemorrhoids to shrink them.  Infrared coagulation. A type of light energy is used to get rid of the hemorrhoids.  Hemorrhoidectomy surgery. The hemorrhoids are surgically removed, and the veins that supply them are tied off.  Stapled hemorrhoidopexy surgery. The surgeon staples the base of the hemorrhoid to the rectal wall. Follow these instructions at home: Eating and drinking   Eat foods that have a lot of fiber in them, such as whole grains, beans, nuts, fruits, and vegetables.  Ask your health care provider about taking products that  have added fiber (fiber supplements).  Reduce the amount of fat in your diet. You can do this by eating low-fat dairy products, eating less red meat, and avoiding processed foods.  Drink enough fluid to keep your urine pale yellow. Managing pain and swelling   Take warm sitz baths for 20 minutes, 3-4 times a day to ease pain and discomfort. You may do this in a bathtub or using a portable sitz bath that fits over the  toilet.  If directed, apply ice to the affected area. Using ice packs between sitz baths may be helpful. ? Put ice in a plastic bag. ? Place a towel between your skin and the bag. ? Leave the ice on for 20 minutes, 2-3 times a day. General instructions  Take over-the-counter and prescription medicines only as told by your health care provider.  Use medicated creams or suppositories as told.  Get regular exercise. Ask your health care provider how much and what kind of exercise is best for you. In general, you should do moderate exercise for at least 30 minutes on most days of the week (150 minutes each week). This can include activities such as walking, biking, or yoga.  Go to the bathroom when you have the urge to have a bowel movement. Do not wait.  Avoid straining to have bowel movements.  Keep the anal area dry and clean. Use wet toilet paper or moist towelettes after a bowel movement.  Do not sit on the toilet for long periods of time. This increases blood pooling and pain.  Keep all follow-up visits as told by your health care provider. This is important. Contact a health care provider if you have:  Increasing pain and swelling that are not controlled by treatment or medicine.  Difficulty having a bowel movement, or you are unable to have a bowel movement.  Pain or inflammation outside the area of the hemorrhoids. Get help right away if you have:  Uncontrolled bleeding from your rectum. Summary  Hemorrhoids are swollen veins in and around the rectum or anus.  Most hemorrhoids can be managed with home treatments such as diet and lifestyle changes.  Taking warm sitz baths can help ease pain and discomfort.  In severe cases, procedures or surgery can be done to shrink or remove the hemorrhoids. This information is not intended to replace advice given to you by your health care provider. Make sure you discuss any questions you have with your health care provider. Document  Released: 12/31/1999 Document Revised: 01/10/2018 Document Reviewed: 05/24/2017 Elsevier Patient Education  2020 [employer].  Constipation, Adult Constipation is when a person has fewer bowel movements in a week than normal, has difficulty having a bowel movement, or has stools that are dry, hard, or larger than normal. Constipation may be caused by an underlying condition. It may become worse with age if a person takes certain medicines and does not take in enough fluids. Follow these instructions at home: Eating and drinking   Eat foods that have a lot of fiber, such as fresh fruits and vegetables, whole grains, and beans.  Limit foods that are high in fat, low in fiber, or overly processed, such as french fries, hamburgers, cookies, candies, and soda.  Drink enough fluid to keep your urine clear or pale yellow. General instructions  Exercise regularly or as told by your health care provider.  Go to the restroom when you have the urge to go. Do not hold it in.  Take over-the-counter and  prescription medicines only as told by your health care provider. These include any fiber supplements.  Practice pelvic floor retraining exercises, such as deep breathing while relaxing the lower abdomen and pelvic floor relaxation during bowel movements.  Watch your condition for any changes.  Keep all follow-up visits as told by your health care provider. This is important. Contact a health care provider if:  You have pain that gets worse.  You have a fever.  You do not have a bowel movement after 4 days.  You vomit.  You are not hungry.  You lose weight.  You are bleeding from the anus.  You have thin, pencil-like stools. Get help right away if:  You have a fever and your symptoms suddenly get worse.  You leak stool or have blood in your stool.  Your abdomen is bloated.  You have severe pain in your abdomen.  You feel dizzy or you faint. This information is not intended  to replace advice given to you by your health care provider. Make sure you discuss any questions you have with your health care provider. Document Released: 10/01/2003 Document Revised: 12/15/2016 Document Reviewed: 06/23/2015 Elsevier Patient Education  [employer].     If you have lab work done today you will be contacted with your lab results within the next 2 weeks.  If you have not heard from [place] then please contact us. The fastest way to get your results is to register for My Chart.   IF you received an x-ray today, you will receive an invoice from [facility]. Please contact [facility] at [phone] with questions or concerns regarding your invoice.   IF you received labwork today, you will receive an invoice from [facility]. Please contact [facility] at [phone] with questions or concerns regarding your invoice.   Our billing staff will not be able to assist you with questions regarding bills from these companies.  You will be contacted with the lab results as soon as they are available. The fastest way to get your results is to activate your My Chart account. Instructions are located on the last page of this paperwork. If you have not heard from [place] regarding the results in 2 weeks, please contact this office.

## 2018-08-16 ENCOUNTER — Ambulatory Visit: Payer: 59 | Admitting: Family Medicine

## 2018-08-16 LAB — HEPATITIS C ANTIBODY
Hepatitis C Ab: NONREACTIVE
SIGNAL TO CUT-OFF: 0.01 (ref <1.00)

## 2018-08-16 LAB — COMPREHENSIVE METABOLIC PANEL
AG Ratio: 1.5 (calc) (ref 1.0–2.5)
ALT: 13 U/L (ref 9–46)
AST: 15 U/L (ref 10–35)
Albumin: 4.3 g/dL (ref 3.6–5.1)
Alkaline phosphatase (APISO): 92 U/L (ref 35–144)
BUN: 8 mg/dL (ref 7–25)
CO2: 22 mmol/L (ref 20–32)
Calcium: 9.6 mg/dL (ref 8.6–10.3)
Chloride: 107 mmol/L (ref 98–110)
Creat: 0.82 mg/dL (ref 0.70–1.33)
Globulin: 2.8 g/dL (calc) (ref 1.9–3.7)
Glucose, Bld: 94 mg/dL (ref 65–99)
Potassium: 4.2 mmol/L (ref 3.5–5.3)
Sodium: 139 mmol/L (ref 135–146)
Total Bilirubin: 1.5 mg/dL — ABNORMAL HIGH (ref 0.2–1.2)
Total Protein: 7.1 g/dL (ref 6.1–8.1)

## 2018-08-16 LAB — LIPID PANEL
Cholesterol: 117 mg/dL (ref <200)
HDL: 33 mg/dL — ABNORMAL LOW (ref >=40)
LDL Cholesterol (Calc): 61 mg/dL (calc)
Non-HDL Cholesterol (Calc): 84 mg/dL (calc) (ref <130)
Total CHOL/HDL Ratio: 3.5 (calc) (ref <5.0)
Triglycerides: 143 mg/dL (ref <150)

## 2018-08-16 LAB — EXTRA LAV TOP TUBE

## 2018-09-27 ENCOUNTER — Ambulatory Visit (INDEPENDENT_AMBULATORY_CARE_PROVIDER_SITE_OTHER): Payer: 59 | Admitting: Gastroenterology

## 2018-09-27 ENCOUNTER — Other Ambulatory Visit (INDEPENDENT_AMBULATORY_CARE_PROVIDER_SITE_OTHER): Payer: 59

## 2018-09-27 ENCOUNTER — Encounter: Payer: Self-pay | Admitting: Gastroenterology

## 2018-09-27 VITALS — BP 130/70 | HR 79 | Temp 98.4°F | Ht 68.0 in | Wt 157.0 lb

## 2018-09-27 DIAGNOSIS — K649 Unspecified hemorrhoids: Secondary | ICD-10-CM | POA: Diagnosis not present

## 2018-09-27 LAB — CBC WITH DIFFERENTIAL/PLATELET
Basophils Absolute: 0.1 10*3/uL (ref 0.0–0.1)
Basophils Relative: 1.6 % (ref 0.0–3.0)
Eosinophils Absolute: 0.1 10*3/uL (ref 0.0–0.7)
Eosinophils Relative: 3.1 % (ref 0.0–5.0)
HCT: 41.8 % (ref 39.0–52.0)
Hemoglobin: 13.9 g/dL (ref 13.0–17.0)
Lymphocytes Relative: 34.8 % (ref 12.0–46.0)
Lymphs Abs: 1.6 10*3/uL (ref 0.7–4.0)
MCHC: 33.2 g/dL (ref 30.0–36.0)
MCV: 87.1 fl (ref 78.0–100.0)
Monocytes Absolute: 0.4 10*3/uL (ref 0.1–1.0)
Monocytes Relative: 8.5 % (ref 3.0–12.0)
Neutro Abs: 2.3 10*3/uL (ref 1.4–7.7)
Neutrophils Relative %: 52 % (ref 43.0–77.0)
Platelets: 247 10*3/uL (ref 150.0–400.0)
RBC: 4.8 Mil/uL (ref 4.22–5.81)
RDW: 13.2 % (ref 11.5–15.5)
WBC: 4.5 10*3/uL (ref 4.0–10.5)

## 2018-09-27 NOTE — Progress Notes (Signed)
HPI: This is a very pleasant 58 year old man   who was referred to me by [name], MD  to evaluate rectal bleeding.    Chief complaint is rectal bleeding  He has had trouble with hemorrhoids for about 5 years.  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  He has intermittent swelling in his bottom and intermittent rectal bleeding.  Lately the bleeding has become almost daily with every bowel movement.  He has been on Plavix for a year since a small stroke.  He does struggle with constipation.  He takes MiraLAX 1 dose once daily for at least a year and this improved his constipation a bit but not completely.  He has lost 5 pounds intentionally in the past 6 months.  Colon cancer does not run in his family  Old Data Reviewed:  He had a colonoscopy October 2011 at [facility] gastro.  This was for hematochezia.  They documented diverticulosis, internal and external hemorrhoids.  No polyps were noted.  He was recommended to have repeat colonoscopy for routine screening in 10 years.    Review of systems: Pertinent positive and negative review of systems were noted in the above HPI section. All other review negative.   Past Medical History:  Diagnosis Date  . Allergy   . BPH (benign prostatic hyperplasia)    followed by [facility]  . Hyperlipidemia   . Hypertension   . Low back pain radiating to left lower extremity    sees Dr. Tonita Cong  . Stroke [facility])     Past Surgical History:  Procedure Laterality Date  . COLONOSCOPY    . ESOPHAGOGASTRODUODENOSCOPY      Current Outpatient Medications  Medication Sig Dispense Refill  . atorvastatin (LIPITOR) 20 MG tablet Take 1 tablet (20 mg total) by mouth daily at 6 PM. 90 tablet 2  . clopidogrel (PLAVIX) 75 MG tablet Take 1 tablet (75 mg total) by mouth daily. 90 tablet 3  . fluticasone (FLONASE) 50 MCG/ACT nasal spray Place 1 spray into both nostrils daily.  2  . gabapentin  (NEURONTIN) 600 MG tablet TAKE 1 TABLET BY MOUTH THREE TIMES A DAY 270 tablet 1  . lisinopril (ZESTRIL) 10 MG tablet Take 1 tablet (10 mg total) by mouth daily. 90 tablet 2  . loratadine (CLARITIN) 10 MG tablet Take 10 mg by mouth daily as needed for allergies.    . polyethylene glycol (MIRALAX / GLYCOLAX) 17 g packet Take 17 g by mouth daily.    . tamsulosin (FLOMAX) 0.4 MG CAPS capsule Take 0.4 mg by mouth daily.    Marland Kitchen amantadine (SYMMETREL) 100 MG capsule Take 2 capsules (200 mg total) by mouth 2 (two) times daily with breakfast and lunch for 30 days. 120 capsule 5   No current facility-administered medications for this visit.     Allergies as of 09/27/2018  . (No Known Allergies)    Family History  Problem Relation Age of Onset  . Cirrhosis Mother        NASH  . Other Mother        Delbert Phenix as a child  . Hyperlipidemia Father   . Asthma Father   . Heart failure Father   . Colon cancer Neg Hx   . Esophageal cancer Neg Hx   . Rectal cancer Neg Hx     Social History   Socioeconomic History  . Marital status: Married  Spouse name: Not on file  . Number of children: 1  . Years of education: Not on file  . Highest education level: High school graduate  Occupational History  . Occupation: driver  Social Needs  . Financial resource strain: Not on file  . Food insecurity    Worry: Not on file    Inability: Not on file  . Transportation needs    Medical: Not on file    Non-medical: Not on file  Tobacco Use  . Smoking status: Never Smoker  . Smokeless tobacco: Never Used  Substance and Sexual Activity  . Alcohol use: Yes    Comment: Once a month   . Drug use: No  . Sexual activity: Not on file  Lifestyle  . Physical activity    Days per week: Not on file    Minutes per session: Not on file  . Stress: Not on file  Relationships  . Social Herbalist on phone: Not on file    Gets together: Not on file    Attends religious service: Not on file    Active  member of club or organization: Not on file    Attends meetings of clubs or organizations: Not on file    Relationship status: Not on file  . Intimate partner violence    Fear of current or ex partner: Not on file    Emotionally abused: Not on file    Physically abused: Not on file    Forced sexual activity: Not on file  Other Topics Concern  . Not on file  Social History Narrative   Lives at home with his wife   Right handed   Caffeine: 1 cup of coffee in the mornings      Physical Exam: BP 130/70   Pulse 79   Temp 98.4 F (36.9 C)   Ht 5\' 8"  (1.727 m)   Wt 157 lb (71.2 kg)   BMI 23.87 kg/m  Constitutional: generally well-appearing Psychiatric: alert and oriented x3 Eyes: extraocular movements intact Mouth: oral pharynx moist, no lesions Neck: supple no lymphadenopathy Cardiovascular: heart regular rate and rhythm Lungs: clear to auscultation bilaterally Abdomen: soft, nontender, nondistended, no obvious ascites, no peritoneal signs, normal bowel sounds Extremities: no lower extremity edema bilaterally Skin: no lesions on visible extremities Rectal examination: Small deflated external anal hemorrhoids, no obvious anal fissure, no distal rectal masses stool was brown and not check for Hemoccult status  Assessment and plan: 58 y.o. male with minor rectal bleeding  His bleeding is likely hemorrhoidal but it has been 9 years since his last colonoscopy and I recommend that we repeat that for him at his soonest convenience.  Plavix undoubtedly contributes to the amount of blood he is losing.  I do not think he is anemic but he will get a CBC to be certain today.  I recommended that he continue taking MiraLAX 1 dose once daily and start also taking Citrucel powder fiber supplement once daily as well.  I hope that this will better relieve his intermittent constipation and intern that this will help relieve his hemorrhoids.  He understands a being on Plavix puts him at risk for  bleeding complications during a colonoscopy.  We will reach out to his primary care physician to make sure that it is safe that he hold that medicine for 5 days prior to a colonoscopy.    Please see the "Patient Instructions" section for addition details about the plan.   Owens Loffler,  MD [facility] 09/27/2018, 8:51 AM  Cc: [name], MD

## 2018-09-27 NOTE — Patient Instructions (Addendum)
Please start taking Citrucel(orange flavored) powder supplement.This may cause some bloating at first,but that usually goes away.Use one scoop daily  Continue miralax  We will contact you next month to schedule your colonoscopy for November or December  Your provider has requested that you go to the basement level for lab work before leaving today. Press "B" on the elevator. The lab is located at the first door on the left as you exit the elevator.   Thank you for entrusting me with your care and choosing [facility].  Dr Ardis Hughs

## 2018-10-26 ENCOUNTER — Other Ambulatory Visit: Payer: Self-pay | Admitting: Physical Medicine & Rehabilitation

## 2018-10-30 ENCOUNTER — Telehealth: Payer: Self-pay

## 2018-10-30 NOTE — Telephone Encounter (Signed)
Request for plavix hold noted. This should be discussed with neurology regarding timing and if ok given his history of CVA.  Will forward to Dr. Leonie Man (unable to forward to Ms. McCue or [place] through [facility]).  Thanks -[employer]

## 2018-10-30 NOTE — Telephone Encounter (Signed)
[facility] Pre-operative Risk Assessment     Request for surgical clearance:     Endoscopy Procedure  What type of surgery is being performed?     colonoscopy  When is this surgery scheduled?     12/06/18  What type of clearance is required ?   Pharmacy  Are there any medications that need to be held prior to surgery and how long? Plavix  Practice name and name of physician performing surgery?      [facility]  What is your office phone and fax number?      Phone- [phone]  [phone]  Anesthesia type (None, local, MAC, general) ?       MAC

## 2018-11-01 NOTE — Telephone Encounter (Signed)
I have reviewed the patient's chart.  He had a stroke in May 2019 and seems to be doing well.  He can hold Plavix for 3 to 5 days prior to schedule endoscopy procedure with a small but acceptable periprocedural risk of TIA/stroke if patient is willing.  Resume Plavix when safe after the procedure

## 2018-11-05 NOTE — Telephone Encounter (Signed)
Spoke to patient to let him know that he can hold his Plavix 5 days prior to his colonoscopy scheduled for 12/06/18 per Dr Mamie Nick. He voiced understanding

## 2018-11-08 ENCOUNTER — Ambulatory Visit: Payer: 59 | Admitting: Physical Medicine & Rehabilitation

## 2018-11-11 ENCOUNTER — Other Ambulatory Visit: Payer: Self-pay | Admitting: Physical Medicine & Rehabilitation

## 2018-11-11 NOTE — Telephone Encounter (Signed)
I believe it says he is on 200 mg with breakfast and lunch.  We can refill it thanks.

## 2018-11-11 NOTE — Telephone Encounter (Signed)
No mention of medication in last note, unsure if ok to refill, please advise.

## 2018-11-12 ENCOUNTER — Encounter: Payer: 59 | Attending: Physical Medicine & Rehabilitation | Admitting: Physical Medicine & Rehabilitation

## 2018-11-12 ENCOUNTER — Other Ambulatory Visit: Payer: Self-pay

## 2018-11-12 ENCOUNTER — Encounter: Payer: Self-pay | Admitting: Physical Medicine & Rehabilitation

## 2018-11-12 VITALS — BP 119/80 | HR 84 | Temp 98.1°F | Ht 68.0 in | Wt 159.0 lb

## 2018-11-12 DIAGNOSIS — I69354 Hemiplegia and hemiparesis following cerebral infarction affecting left non-dominant side: Secondary | ICD-10-CM | POA: Diagnosis not present

## 2018-11-12 DIAGNOSIS — R5383 Other fatigue: Secondary | ICD-10-CM

## 2018-11-12 DIAGNOSIS — I639 Cerebral infarction, unspecified: Secondary | ICD-10-CM | POA: Insufficient documentation

## 2018-11-12 DIAGNOSIS — I6381 Other cerebral infarction due to occlusion or stenosis of small artery: Secondary | ICD-10-CM

## 2018-11-12 NOTE — Progress Notes (Signed)
Subjective:    Patient ID: Robert Sandoval, male    DOB: 04-Aug-1960, 58 y.o.   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  Last clinic visit 05/10/2018.  Since that time, patient went to the ED for Covid, notes reviewed.  Fatigue has improved.  Tingling has improved as well.   Pain Inventory Average Pain 0 Pain Right Now 0 My pain is na  In the last 24 hours, has pain interfered with the following? General activity 0 Relation with others 0 Enjoyment of life 9 What TIME of day is your pain at its worst? na Sleep (in general) Good  Pain is worse with: na Pain improves with: na Relief from Meds: na  Mobility walk without assistance how many minutes can you walk? 60 ability to climb steps?  yes do you drive?  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  no  Physicians involved in your care Any changes since last visit?  no   Family History  Problem Relation Age of Onset  . Cirrhosis Mother        NASH  . Other Mother        Delbert Phenix as a child  . Hyperlipidemia Father   . Asthma Father   . Heart failure Father   . Colon cancer Neg Hx   . Esophageal cancer Neg Hx   . Rectal cancer Neg Hx    Social History   Socioeconomic History  . Marital status: Married    Spouse name: Not on file  . Number of children: 1  . Years of education: Not on file  . Highest education level: High school graduate  Occupational History  . Occupation: driver  Social Needs  . Financial resource strain: Not on file  . Food insecurity    Worry: Not on file    Inability: Not on file  . Transportation needs    Medical: Not on file    Non-medical: Not on file  Tobacco Use  . Smoking status: Never  Smoker  . Smokeless tobacco: Never Used  Substance and Sexual Activity  . Alcohol use: Yes    Comment: Once a month   . Drug use: No  . Sexual activity: Not on file  Lifestyle  . Physical activity    Days per week: Not on file    Minutes per session: Not on file  . Stress: Not on file  Relationships  . Social Herbalist on phone: Not on file    Gets together: Not on file    Attends religious service: Not on file    Active member of club or organization: Not on file    Attends meetings of clubs or organizations: Not on file    Relationship status: Not on file  Other Topics Concern  . Not on file  Social History Narrative   Lives at home with his wife   Right handed   Caffeine: 1 cup of coffee in the mornings    Past Surgical History:  Procedure Laterality Date  . COLONOSCOPY    . ESOPHAGOGASTRODUODENOSCOPY     Past Medical History:  Diagnosis Date  . Allergy   . BPH (benign prostatic hyperplasia)    followed by  [facility]  . Hyperlipidemia   . Hypertension   . Low back pain radiating to left lower extremity    sees Dr. Tonita Cong  . Stroke ([facility])    BP 119/80   Pulse 84   Temp 98.1 F (36.7 C) (Oral)   Ht 5\' 8"  (1.727 m)   Wt 159 lb (72.1 kg)   SpO2 96%   BMI 24.18 kg/m   Opioid Risk Score:   Fall Risk Score:  `1  Depression screen PHQ 2/9  Depression screen [facility] 2/9 08/15/2018 03/29/2018 09/07/2017 07/26/2017 06/22/2017 06/19/2017 08/05/2016  Decreased Interest 0 0 0 0 0 0 0  Down, Depressed, Hopeless 0 0 0 0 0 0 0  PHQ - 2 Score 0 0 0 0 0 0 0   Review of Systems  Constitutional: Negative.   HENT: Negative.   Eyes: Negative.   Respiratory: Negative.   Cardiovascular: Negative.   Gastrointestinal: Negative.   Endocrine: Negative.   Genitourinary: Negative.   Musculoskeletal: Negative.   Skin: Negative.   Allergic/Immunologic: Negative.   Neurological: Negative.        Tingling  Hematological: Negative.   Psychiatric/Behavioral: Negative.        Objective:   Physical Exam General: NAD.  Well-developed. Well-nourished. HENT: Normocephalic. Atraumatic.  Eyes: EOMI. No discharge. Heart:No JVD. Lungs: Clear to auscultation, breathingunlabored. Abdomen: Nondistended Musculoskeletal: No edema or tenderness in extremities Neurologic: Alert Motor:  4+/5 left deltoid bicep tricep grip  4+/5 left hip flexor, knee extensor, ankle dorsiflexor Skin:warm and dry. Intact. 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  1.  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

## 2018-11-13 ENCOUNTER — Ambulatory Visit (AMBULATORY_SURGERY_CENTER): Payer: Self-pay | Admitting: *Deleted

## 2018-11-13 ENCOUNTER — Other Ambulatory Visit: Payer: Self-pay

## 2018-11-13 VITALS — Temp 96.8°F | Ht 68.0 in | Wt 160.0 lb

## 2018-11-13 DIAGNOSIS — K625 Hemorrhage of anus and rectum: Secondary | ICD-10-CM

## 2018-11-13 NOTE — Progress Notes (Signed)
No egg or soy allergy known to patient  No issues with past sedation with any surgeries  or procedures, no intubation problems  No diet pills per patient No home 02 use per patient  PLAVIX HELD X 5 DAYS, LAST DOSE 11/30/2018  Pt denies issues with constipation WHILE USING MIRALAX, PATIENT INSTRUCTED TO CONTINUE MIRALAX  No A fib or A flutter   EMMI  INFORMATION GIVEN  COVID SCREENING TEST 12/03/2018 AT 1010  Due to the COVID-19 pandemic we are asking patients to follow these guidelines. Please only bring one care partner. Please be aware that your care partner may wait in the car in the parking lot or if they feel like they will be too hot to wait in the car, they may wait in the lobby on the 4th floor. All care partners are required to wear a mask the entire time (we do not have any that we can provide them), they need to practice social distancing, and we will do a Covid check for all patient's and care partners when you arrive. Also we will check their temperature and your temperature. If the care partner waits in their car they need to stay in the parking lot the entire time and we will call them on their cell phone when the patient is ready for discharge so they can bring the car to the front of the building. Also all patient's will need to wear a mask into building.

## 2018-11-22 ENCOUNTER — Encounter: Payer: Self-pay | Admitting: Gastroenterology

## 2018-12-03 ENCOUNTER — Other Ambulatory Visit: Payer: Self-pay | Admitting: Gastroenterology

## 2018-12-03 ENCOUNTER — Ambulatory Visit (INDEPENDENT_AMBULATORY_CARE_PROVIDER_SITE_OTHER): Payer: 59

## 2018-12-03 DIAGNOSIS — Z1159 Encounter for screening for other viral diseases: Secondary | ICD-10-CM

## 2018-12-04 LAB — SARS CORONAVIRUS 2 (TAT 6-24 HRS): SARS Coronavirus 2: NEGATIVE

## 2018-12-06 ENCOUNTER — Encounter: Payer: Self-pay | Admitting: Gastroenterology

## 2018-12-06 ENCOUNTER — Other Ambulatory Visit: Payer: Self-pay

## 2018-12-06 ENCOUNTER — Ambulatory Visit (AMBULATORY_SURGERY_CENTER): Payer: 59 | Admitting: Gastroenterology

## 2018-12-06 VITALS — BP 106/64 | HR 63 | Temp 97.9°F | Resp 15 | Ht 68.0 in | Wt 160.0 lb

## 2018-12-06 DIAGNOSIS — D123 Benign neoplasm of transverse colon: Secondary | ICD-10-CM | POA: Diagnosis not present

## 2018-12-06 DIAGNOSIS — K625 Hemorrhage of anus and rectum: Secondary | ICD-10-CM

## 2018-12-06 DIAGNOSIS — K5730 Diverticulosis of large intestine without perforation or abscess without bleeding: Secondary | ICD-10-CM | POA: Diagnosis not present

## 2018-12-06 DIAGNOSIS — K648 Other hemorrhoids: Secondary | ICD-10-CM | POA: Diagnosis not present

## 2018-12-06 MED ORDER — SODIUM CHLORIDE 0.9 % IV SOLN: 500.0000 mL | Freq: Once | INTRAVENOUS | Status: DC

## 2018-12-06 NOTE — Op Note (Signed)
[name] Patient Name: Robert Sandoval Procedure Date: 12/06/2018 9:53 AM MRN: [id number] Endoscopist: Milus Banister , MD Age: 58 Referring MD:  Date of Birth: 1960-02-24 Gender: Male Account #: [id number] Procedure:                Colonoscopy Indications:              Hematochezia Medicines:                Monitored Anesthesia Care Procedure:                Pre-Anesthesia Assessment:                           - Prior to the procedure, a History and Physical                            was performed, and patient medications and                            allergies were reviewed. The patient's tolerance of                            previous anesthesia was also reviewed. The risks                            and benefits of the procedure and the sedation                            options and risks were discussed with the patient.                            All questions were answered, and informed consent                            was obtained. Prior Anticoagulants: The patient has                            taken Plavix (clopidogrel), last dose was 5 days                            prior to procedure. ASA Grade Assessment: III - A                            patient with severe systemic disease. After                            reviewing the risks and benefits, the patient was                            deemed in satisfactory condition to undergo the                            procedure.  After obtaining informed consent, the colonoscope                            was passed under direct vision. Throughout the                            procedure, the patient's blood pressure, pulse, and                            oxygen saturations were monitored continuously. The                            Colonoscope was introduced through the anus and                            advanced to the the cecum, identified by                            appendiceal  orifice and ileocecal valve. The                            colonoscopy was performed without difficulty. The                            patient tolerated the procedure well. The quality                            of the bowel preparation was good. The ileocecal                            valve, appendiceal orifice, and rectum were                            photographed. Scope In: 10:12:35 AM Scope Out: 10:25:10 AM Scope Withdrawal Time: 0 hours 10 minutes 36 seconds  Total Procedure Duration: 0 hours 12 minutes 35 seconds  Findings:                 A 2 mm polyp was found in the transverse colon. The                            polyp was sessile. The polyp was removed with a                            cold snare. Resection and retrieval were complete.                           Multiple small-mouthed diverticula were found in                            the entire colon.                           External hemorrhoids were found. The hemorrhoids  were small.                           The exam was otherwise without abnormality on                            direct and retroflexion views. Complications:            No immediate complications. Estimated blood loss:                            None. Estimated Blood Loss:     Estimated blood loss: none. Impression:               - One 2 mm polyp in the transverse colon, removed                            with a cold snare. Resected and retrieved.                           - Diverticulosis in the entire examined colon.                           - External hemorrhoids.                           - The examination was otherwise normal on direct                            and retroflexion views. Recommendation:           - Patient has a contact number available for                            emergencies. The signs and symptoms of potential                            delayed complications were discussed with the                             patient. Return to normal activities tomorrow.                            Written discharge instructions were provided to the                            patient.                           - Resume previous diet.                           - Continue present medications. OK to resume plavix                            today.                           -  Await pathology results. Milus Banister, MD 12/06/2018 10:29:54 AM This report has been signed electronically.

## 2018-12-06 NOTE — Progress Notes (Signed)
Called to room to assist during endoscopic procedure.  Patient ID and intended procedure confirmed with present staff. Received instructions for my participation in the procedure from the performing physician.  

## 2018-12-06 NOTE — Progress Notes (Signed)
Pt's states no medical or surgical changes since previsit or office visit.  Cw vitals, JM IV andJB temps.

## 2018-12-06 NOTE — Progress Notes (Signed)
Report to PACU, RN, vss, BBS= Clear.  

## 2018-12-06 NOTE — Patient Instructions (Signed)
HANDOUTS GIVEN FOR POLYPS, DIVERTICULOSIS AND HEMORRHOIDS.  AWAIT PATHOLOGY RESULTS.  RESUME YOUR PLAVIX TODAY.  YOU HAD AN ENDOSCOPIC PROCEDURE TODAY AT [facility]:   Refer to the procedure report that was given to you for any specific questions about what was found during the examination.  If the procedure report does not answer your questions, please call your gastroenterologist to clarify.  If you requested that your care partner not be given the details of your procedure findings, then the procedure report has been included in a sealed envelope for you to review at your convenience later.  YOU SHOULD EXPECT: Some feelings of bloating in the abdomen. Passage of more gas than usual.  Walking can help get rid of the air that was put into your GI tract during the procedure and reduce the bloating. If you had a lower endoscopy (such as a colonoscopy or flexible sigmoidoscopy) you may notice spotting of blood in your stool or on the toilet paper. If you underwent a bowel prep for your procedure, you may not have a normal bowel movement for a few days.  Please Note:  You might notice some irritation and congestion in your nose or some drainage.  This is from the oxygen used during your procedure.  There is no need for concern and it should clear up in a day or so.  SYMPTOMS TO REPORT IMMEDIATELY:   Following lower endoscopy (colonoscopy or flexible sigmoidoscopy):  Excessive amounts of blood in the stool  Significant tenderness or worsening of abdominal pains  Swelling of the abdomen that is new, acute  Fever of 100F or higher  For urgent or emergent issues, a gastroenterologist can be reached at any hour by calling [phone].   DIET:  We do recommend a small meal at first, but then you may proceed to your regular diet.  Drink plenty of fluids but you should avoid alcoholic beverages for 24 hours.  ACTIVITY:  You should plan to take it easy for the rest of today and  you should NOT DRIVE or use heavy machinery until tomorrow (because of the sedation medicines used during the test).    FOLLOW UP: Our staff will call the number listed on your records 48-72 hours following your procedure to check on you and address any questions or concerns that you may have regarding the information given to you following your procedure. If we do not reach you, we will leave a message.  We will attempt to reach you two times.  During this call, we will ask if you have developed any symptoms of COVID 19. If you develop any symptoms (ie: fever, flu-like symptoms, shortness of breath, cough etc.) before then, please call [phone].  If you test positive for Covid 19 in the 2 weeks post procedure, please call and report this information to [place].    If any biopsies were taken you will be contacted by phone or by letter within the next 1-3 weeks.  Please call us at [phone] if you have not heard about the biopsies in 3 weeks.    SIGNATURES/CONFIDENTIALITY: You and/or your care partner have signed paperwork which will be entered into your electronic medical record.  These signatures attest to the fact that that the information above on your After Visit Summary has been reviewed and is understood.  Full responsibility of the confidentiality of this discharge information lies with you and/or your care-partner.

## 2018-12-10 ENCOUNTER — Telehealth: Payer: Self-pay | Admitting: *Deleted

## 2018-12-10 NOTE — Telephone Encounter (Signed)
  Follow up Call-  Call back number 12/06/2018  Post procedure Call Back phone  # [phone]  Permission to leave phone message Yes  Some recent data might be hidden     Patient questions:  Message left to call us if necessary.

## 2018-12-14 ENCOUNTER — Encounter: Payer: Self-pay | Admitting: Gastroenterology

## 2019-01-21 ENCOUNTER — Encounter: Payer: Managed Care, Other (non HMO) | Admitting: Physical Medicine & Rehabilitation

## 2019-02-14 ENCOUNTER — Ambulatory Visit (INDEPENDENT_AMBULATORY_CARE_PROVIDER_SITE_OTHER): Payer: Managed Care, Other (non HMO) | Admitting: Family Medicine

## 2019-02-14 ENCOUNTER — Other Ambulatory Visit: Payer: Self-pay

## 2019-02-14 ENCOUNTER — Encounter: Payer: Self-pay | Admitting: Family Medicine

## 2019-02-14 VITALS — BP 115/78 | HR 69 | Temp 98.2°F | Resp 16 | Ht 68.0 in | Wt 158.0 lb

## 2019-02-14 DIAGNOSIS — Z23 Encounter for immunization: Secondary | ICD-10-CM

## 2019-02-14 DIAGNOSIS — Z125 Encounter for screening for malignant neoplasm of prostate: Secondary | ICD-10-CM | POA: Diagnosis not present

## 2019-02-14 DIAGNOSIS — E785 Hyperlipidemia, unspecified: Secondary | ICD-10-CM | POA: Diagnosis not present

## 2019-02-14 DIAGNOSIS — I6930 Unspecified sequelae of cerebral infarction: Secondary | ICD-10-CM | POA: Diagnosis not present

## 2019-02-14 DIAGNOSIS — I10 Essential (primary) hypertension: Secondary | ICD-10-CM

## 2019-02-14 DIAGNOSIS — Z0000 Encounter for general adult medical examination without abnormal findings: Secondary | ICD-10-CM

## 2019-02-14 DIAGNOSIS — I6381 Other cerebral infarction due to occlusion or stenosis of small artery: Secondary | ICD-10-CM

## 2019-02-14 DIAGNOSIS — Z0001 Encounter for general adult medical examination with abnormal findings: Secondary | ICD-10-CM | POA: Diagnosis not present

## 2019-02-14 DIAGNOSIS — I639 Cerebral infarction, unspecified: Secondary | ICD-10-CM

## 2019-02-14 MED ORDER — SHINGRIX 50 MCG/0.5ML IM SUSR: 0.5000 mL | Freq: Once | INTRAMUSCULAR | 1 refills | Status: AC

## 2019-02-14 MED ORDER — LISINOPRIL 10 MG PO TABS: 10.0000 mg | ORAL_TABLET | Freq: Every day | ORAL | 2 refills | Status: DC

## 2019-02-14 MED ORDER — CLOPIDOGREL BISULFATE 75 MG PO TABS: 75.0000 mg | ORAL_TABLET | Freq: Every day | ORAL | 3 refills | Status: DC

## 2019-02-14 MED ORDER — ATORVASTATIN CALCIUM 20 MG PO TABS: 20.0000 mg | ORAL_TABLET | Freq: Every day | ORAL | 2 refills | Status: DC

## 2019-02-14 NOTE — Patient Instructions (Addendum)
For eye care, here are a few recommendations: Dr. Jerline Pain at [facility], [facility], or [facility] No medication changes at this time.  Shingles vaccine sent to your pharmacy.  I checked prostate test but that result can be discussed with urology, schedule follow-up if you do not have an appointment pending. Follow-up in 6 months, but let me know if there are questions prior to that time.   Keeping you healthy  Get these tests  Blood pressure- Have your blood pressure checked once a year by your healthcare provider.  Normal blood pressure is 120/80  Weight- Have your body mass index (BMI) calculated to screen for obesity.  BMI is a measure of body fat based on height and weight. You can also calculate your own BMI at [email or web address].  Cholesterol- Have your cholesterol checked every year.  Diabetes- Have your blood sugar checked regularly if you have high blood pressure, high cholesterol, have a family history of diabetes or if you are overweight.  Screening for Colon Cancer- Colonoscopy starting at age 78.  Screening may begin sooner depending on your family history and other health conditions. Follow up colonoscopy as directed by your Gastroenterologist.  Screening for Prostate Cancer- Both blood work (PSA) and a rectal exam help screen for Prostate Cancer.  Screening begins at age 19 with African-American men and at age 51 with Caucasian men.  Screening may begin sooner depending on your family history.  Take these medicines  Aspirin- One aspirin daily can help prevent Heart disease and Stroke.  Flu shot- Every fall.  Tetanus- Every 10 years.  Zostavax- Once after the age of 21 to prevent Shingles.  Pneumonia shot- Once after the age of 40; if you are younger than 70, ask your healthcare provider if you need a Pneumonia shot.  Take these steps  Don't smoke- If you do smoke, talk to your doctor about quitting.  For tips on how to quit, go to  www.smokefree.gov or call 1-800-QUIT-NOW.  Be physically active- Exercise 5 days a week for at least 30 minutes.  If you are not already physically active start slow and gradually work up to 30 minutes of moderate physical activity.  Examples of moderate activity include walking briskly, mowing the yard, dancing, swimming, bicycling, etc.  Eat a healthy diet- Eat a variety of healthy food such as fruits, vegetables, low fat milk, low fat cheese, yogurt, lean meant, poultry, fish, beans, tofu, etc. For more information go to www.thenutritionsource.org  Drink alcohol in moderation- Limit alcohol intake to less than two drinks a day. Never drink and drive.  Dentist- Brush and floss twice daily; visit your dentist twice a year.  Depression- Your emotional health is as important as your physical health. If you're feeling down, or losing interest in things you would normally enjoy please talk to your healthcare provider.  Eye exam- Visit your eye doctor every year.  Safe sex- If you may be exposed to a sexually transmitted infection, use a condom.  Seat belts- Seat belts can save your life; always wear one.  Smoke/Carbon Monoxide detectors- These detectors need to be installed on the appropriate level of your home.  Replace batteries at least once a year.  Skin cancer- When out in the sun, cover up and use sunscreen 15 SPF or higher.  Violence- If anyone is threatening you, please tell your healthcare provider.  Living Will/ Health care power of attorney- Speak with your healthcare provider and family.   If you have lab  work done today you will be contacted with your lab results within the next 2 weeks.  If you have not heard from [place] then please contact us. The fastest way to get your results is to register for My Chart.   IF you received an x-ray today, you will receive an invoice from [facility]. Please contact [facility] at [phone] with questions or concerns  regarding your invoice.   IF you received labwork today, you will receive an invoice from [facility]. Please contact [facility] at [phone] with questions or concerns regarding your invoice.   Our billing staff will not be able to assist you with questions regarding bills from these companies.  You will be contacted with the lab results as soon as they are available. The fastest way to get your results is to activate your My Chart account. Instructions are located on the last page of this paperwork. If you have not heard from [place] regarding the results in 2 weeks, please contact this office.

## 2019-02-14 NOTE — Progress Notes (Signed)
Subjective:  Patient ID: Robert Sandoval, male    DOB: [birth year]  Age: 59 y.o. MRN: [id number]  CC:  Chief Complaint  Patient presents with  . Annual Exam  . 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  Has continued on Lipitor and Plavix. Appointment with physical medicine and rehab, Dr. Posey Pronto on October 27.  Fatigue and dysesthesia main complaint at that time but were both continue to improve.  Has been released by neurology.  Started on amantadine 200 mg with breakfast and lunch for fatigue, slow wean, gabapentin 600 mg 3 times daily for dysesthesia, slow wean as symptoms improved. Still on gabapentin BID, amantadine once per day. Stable symptoms. No new weakness.   Hypertension: Lisinopril 10 mg daily,  No new cough or side effects.  Home readings: 130/80 or less.  BP Readings from Last 3 Encounters:  02/14/19 115/78  12/06/18 106/64  11/12/18 119/80   Lab Results  Component Value Date   CREATININE 0.82 08/15/2018   Hyperlipidemia: Previously at goal of less than 70 with history of CVA.  Takes Lipitor 20 mg daily. No new myalgias/side effects.  Lab Results  Component Value Date   CHOL 117 08/15/2018   HDL 33 (L) 08/15/2018   LDLCALC 61 08/15/2018   TRIG 143 08/15/2018   CHOLHDL 3.5 08/15/2018   Lab Results  Component Value Date   ALT 13 08/15/2018   AST 15 08/15/2018   ALKPHOS 73 06/08/2017   BILITOT 1.5 (H) 08/15/2018   Cancer screening Colonoscopy 12/06/2018. Repeat 7 years. Constipation improved with fiber, miralax as needed.  Prostate: Followed by urology for BPH with LUTS, s/p Razum.  - Dr. Gloriann Loan. Appt 11/26/18. PSA 2.34 in 11/19. Requests psa. appt pending in next few months.  Lab Results  Component Value Date   PSA1 2.1 08/05/2016   PSA  2.23 09/14/2014   PSA 1.85 03/17/2014   Immunization History  Administered Date(s) Administered  . Influenza,inj,Quad PF,6+ Mos 03/17/2014, 11/05/2014  . Tdap 08/15/2018  Shingles: had disease in 2014. Agrees to vaccine.  Flu vaccine at pharmacy 12/27/18.  Plans on covid vaccine  Depression screen [facility] 2/9 02/14/2019 08/15/2018 03/29/2018 09/07/2017 07/26/2017  Decreased Interest 0 0 0 0 0  Down, Depressed, Hopeless 0 0 0 0 0  PHQ - 2 Score 0 0 0 0 0    Hearing Screening   125Hz  250Hz  500Hz  1000Hz  2000Hz  3000Hz  4000Hz  6000Hz  8000Hz   Right ear:           Left ear:             Visual Acuity Screening   Right eye Left eye Both eyes  Without correction:     With correction: 20/30 20/40 20/20   optho - needs updated eval. Wears glasses.  Years ago.   Dental: every 6 months.   Exercise: walking daily 40mins, some weights at home 3 d/week.   History Patient Active Problem List   Diagnosis Date Noted  . Other fatigue 01/03/2018  . Alteration of sensation as late effect of stroke 09/07/2017  . History of cerebrovascular accident 08/22/2017  . Low back pain 08/22/2017  . Spinal stenosis of lumbar region 08/21/2017  . Degeneration of lumbar intervertebral disc 08/07/2017  . Lumbar radiculopathy 08/07/2017  . Hypotension due to drugs   .  Labile blood pressure   . Benign prostatic hyperplasia   . Hemiparesis affecting left side as late effect of stroke ([place])   . Basal ganglia infarction ([place]) 06/07/2017  . 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 ([place])   . Gait disturbance, post-stroke   . Hyperlipidemia   . Acute ischemic stroke ([place]) - R basal ganglia s/p IV tPA 06/05/2017  . Prostate nodule 10/30/2016  . Essential hypertension 08/05/2016  . External hemorrhoids without complication [id number]  . Melanosis of colon 11/23/2015  . Diverticulosis of colon without hemorrhage 11/23/2015  . Internal hemorrhoids 11/23/2015  . Essential hypertension,  benign 09/14/2014   Past Medical History:  Diagnosis Date  . Allergy   . BPH (benign prostatic hyperplasia)    followed by [facility]  . Hyperlipidemia   . Hypertension   . Low back pain radiating to left lower extremity    sees Dr. Tonita Cong  . Stroke [facility])    05/2017   Past Surgical History:  Procedure Laterality Date  . COLONOSCOPY    . ESOPHAGOGASTRODUODENOSCOPY    . REZUM     No Known Allergies Prior to Admission medications   Medication Sig Start Date End Date Taking? Authorizing Provider  atorvastatin (LIPITOR) 20 MG tablet Take 1 tablet (20 mg total) by mouth daily at 6 PM. 08/15/18  Yes [name], MD  clopidogrel (PLAVIX) 75 MG tablet Take 1 tablet (75 mg total) by mouth daily. 08/15/18  Yes [name], MD  fluticasone (FLONASE) 50 MCG/ACT nasal spray Place 1 spray into both nostrils daily. 06/13/17  Yes Love, [name], PA-C  gabapentin (NEURONTIN) 600 MG tablet TAKE 1 TABLET BY MOUTH THREE TIMES A DAY 10/28/18  Yes Patel, Domenick Bookbinder, MD  lisinopril (ZESTRIL) 10 MG tablet Take 1 tablet (10 mg total) by mouth daily. 08/15/18  Yes [name], MD  loratadine (CLARITIN) 10 MG tablet Take 10 mg by mouth daily as needed for allergies.   Yes [provider]  polyethylene glycol (MIRALAX / GLYCOLAX) 17 g packet Take 17 g by mouth daily.   Yes [provider]  amantadine (SYMMETREL) 100 MG capsule TAKE 2 CAPSULES (200 MG TOTAL) BY MOUTH 2 (TWO) TIMES DAILY WITH BREAKFAST AND LUNCH FOR 30 DAYS. 11/11/18 12/11/18  Jamse Arn, MD  benzonatate (TESSALON) 100 MG capsule TAKE 1 TO 2 CAPSULES BY MOUTH 3 TIMES A DAY 06/28/18   [provider]  tamsulosin (FLOMAX) 0.4 MG CAPS capsule Take 0.4 mg by mouth daily.    [provider]   Social History   Socioeconomic History  . Marital status: Married    Spouse name: Not on file  . Number of children: 1  . Years of education: Not on file  . Highest education level: High school  graduate  Occupational History  . Occupation: driver  Tobacco Use  . Smoking status: Never Smoker  . Smokeless tobacco: Never Used  Substance and Sexual Activity  . Alcohol use: Yes    Comment: Once a month   . Drug use: No  . Sexual activity: Not on file  Other Topics Concern  . Not on file  Social History Narrative   Lives at home with his wife   Right handed   Caffeine: 1 cup of coffee in the mornings    Social Determinants of Health   Financial Resource Strain:   . Difficulty of Paying Living Expenses: Not on file  Food Insecurity:   .  Worried About Charity fundraiser in the Last Year: Not on file  . Ran Out of Food in the Last Year: Not on file  Transportation Needs:   . Lack of Transportation (Medical): Not on file  . Lack of Transportation (Non-Medical): Not on file  Physical Activity:   . Days of Exercise per Week: Not on file  . Minutes of Exercise per Session: Not on file  Stress:   . Feeling of Stress : Not on file  Social Connections:   . Frequency of Communication with Friends and Family: Not on file  . Frequency of Social Gatherings with Friends and Family: Not on file  . Attends Religious Services: Not on file  . Active Member of Clubs or Organizations: Not on file  . Attends Archivist Meetings: Not on file  . Marital Status: Not on file  Intimate Partner Violence:   . Fear of Current or Ex-Partner: Not on file  . Emotionally Abused: Not on file  . Physically Abused: Not on file  . Sexually Abused: Not on file    Review of Systems  13 point review of systems per patient health survey noted.  Negative other than as indicated above or in HPI.   Objective:   Vitals:   02/14/19 0840  BP: 115/78  Pulse: 69  Resp: 16  Temp: 98.2 F (36.8 C)  TempSrc: Temporal  SpO2: 97%  Weight: 158 lb (71.7 kg)  Height: 5\' 8"  (1.727 m)    Physical Exam Vitals reviewed.  Constitutional:      Appearance: He is well-developed.  HENT:     Head:  Normocephalic and atraumatic.     Right Ear: External ear normal.     Left Ear: External ear normal.  Eyes:     Conjunctiva/sclera: Conjunctivae normal.     Pupils: Pupils are equal, round, and reactive to light.  Neck:     Thyroid: No thyromegaly.  Cardiovascular:     Rate and Rhythm: Normal rate and regular rhythm.     Heart sounds: Normal heart sounds.  Pulmonary:     Effort: Pulmonary effort is normal. No respiratory distress.     Breath sounds: Normal breath sounds. No wheezing.  Abdominal:     General: There is no distension.     Palpations: Abdomen is soft.     Tenderness: There is no abdominal tenderness.  Musculoskeletal:        General: No tenderness. Normal range of motion.     Cervical back: Normal range of motion and neck supple.  Lymphadenopathy:     Cervical: No cervical adenopathy.  Skin:    General: Skin is warm and dry.  Neurological:     Mental Status: He is alert and oriented to person, place, and time.     Deep Tendon Reflexes: Reflexes are normal and symmetric.  Psychiatric:        Behavior: Behavior normal.        Assessment & Plan:  Robert Sandoval is a 59 y.o. male . Annual physical exam  - -anticipatory guidance as below in AVS, screening labs above. Health maintenance items as above in HPI discussed/recommended as applicable. \ Hyperlipidemia, unspecified hyperlipidemia type - Plan: atorvastatin (LIPITOR) 20 MG tablet, Comprehensive metabolic panel, Lipid panel  Cerebrovascular accident (CVA) of basal ganglia (HCC) - Plan: clopidogrel (PLAVIX) 75 MG tablet  -Stable with management from physical medicine rehab including gabapentin and amantadine lower doses.  Continue Plavix.  Monitor lipids, glycemia, blood pressure -  appear to be at goal.  Continue ACE inhibitor, statin.  Screening for prostate cancer - Plan: PSA  -History of BPH with LUTS, follow-up with urology as planned.  PSA obtained in anticipation of an appointment.  Limitations of testing  were discussed.  Need for shingles vaccine - Plan: Zoster Vaccine Adjuvanted [facility]) injection to pharmacy  Essential hypertension - Plan: lisinopril (ZESTRIL) 10 MG tablet  -  Stable, tolerating current regimen. Medications refilled. Labs pending as above.    Meds ordered this encounter  Medications  . atorvastatin (LIPITOR) 20 MG tablet    Sig: Take 1 tablet (20 mg total) by mouth daily at 6 PM.    Dispense:  90 tablet    Refill:  2  . clopidogrel (PLAVIX) 75 MG tablet    Sig: Take 1 tablet (75 mg total) by mouth daily.    Dispense:  90 tablet    Refill:  3  . Zoster Vaccine Adjuvanted [facility]) injection    Sig: Inject 0.5 mLs into the muscle once for 1 dose. Repeat in 2-6 months.    Dispense:  0.5 mL    Refill:  1  . lisinopril (ZESTRIL) 10 MG tablet    Sig: Take 1 tablet (10 mg total) by mouth daily.    Dispense:  90 tablet    Refill:  2   Patient Instructions    For eye care, here are a few recommendations: Dr. Jerline Pain at [facility], [facility], or [facility] No medication changes at this time.  Shingles vaccine sent to your pharmacy.  I checked prostate test but that result can be discussed with urology, schedule follow-up if you do not have an appointment pending. Follow-up in 6 months, but let me know if there are questions prior to that time.   Keeping you healthy  Get these tests  Blood pressure- Have your blood pressure checked once a year by your healthcare provider.  Normal blood pressure is 120/80  Weight- Have your body mass index (BMI) calculated to screen for obesity.  BMI is a measure of body fat based on height and weight. You can also calculate your own BMI at ViewBanking.si.  Cholesterol- Have your cholesterol checked every year.  Diabetes- Have your blood sugar checked regularly if you have high blood pressure, high cholesterol, have a family history of diabetes or if you are overweight.  Screening for Colon Cancer-  Colonoscopy starting at age 74.  Screening may begin sooner depending on your family history and other health conditions. Follow up colonoscopy as directed by your Gastroenterologist.  Screening for Prostate Cancer- Both blood work (PSA) and a rectal exam help screen for Prostate Cancer.  Screening begins at age 16 with African-American men and at age 103 with Caucasian men.  Screening may begin sooner depending on your family history.  Take these medicines  Aspirin- One aspirin daily can help prevent Heart disease and Stroke.  Flu shot- Every fall.  Tetanus- Every 10 years.  Zostavax- Once after the age of 47 to prevent Shingles.  Pneumonia shot- Once after the age of 25; if you are younger than 98, ask your healthcare provider if you need a Pneumonia shot.  Take these steps  Don't smoke- If you do smoke, talk to your doctor about quitting.  For tips on how to quit, go to www.smokefree.gov or call 1-800-QUIT-NOW.  Be physically active- Exercise 5 days a week for at least 30 minutes.  If you are not already physically active start  slow and gradually work up to 30 minutes of moderate physical activity.  Examples of moderate activity include walking briskly, mowing the yard, dancing, swimming, bicycling, etc.  Eat a healthy diet- Eat a variety of healthy food such as fruits, vegetables, low fat milk, low fat cheese, yogurt, lean meant, poultry, fish, beans, tofu, etc. For more information go to www.thenutritionsource.org  Drink alcohol in moderation- Limit alcohol intake to less than two drinks a day. Never drink and drive.  Dentist- Brush and floss twice daily; visit your dentist twice a year.  Depression- Your emotional health is as important as your physical health. If you're feeling down, or losing interest in things you would normally enjoy please talk to your healthcare provider.  Eye exam- Visit your eye doctor every year.  Safe sex- If you may be exposed to a sexually transmitted  infection, use a condom.  Seat belts- Seat belts can save your life; always wear one.  Smoke/Carbon Monoxide detectors- These detectors need to be installed on the appropriate level of your home.  Replace batteries at least once a year.  Skin cancer- When out in the sun, cover up and use sunscreen 15 SPF or higher.  Violence- If anyone is threatening you, please tell your healthcare provider.  Living Will/ Health care power of attorney- Speak with your healthcare provider and family.   If you have lab work done today you will be contacted with your lab results within the next 2 weeks.  If you have not heard from [place] then please contact us. The fastest way to get your results is to register for My Chart.   IF you received an x-ray today, you will receive an invoice from [facility]. Please contact [facility] at [phone] with questions or concerns regarding your invoice.   IF you received labwork today, you will receive an invoice from [facility]. Please contact [facility] at [phone] with questions or concerns regarding your invoice.   Our billing staff will not be able to assist you with questions regarding bills from these companies.  You will be contacted with the lab results as soon as they are available. The fastest way to get your results is to activate your My Chart account. Instructions are located on the last page of this paperwork. If you have not heard from [place] regarding the results in 2 weeks, please contact this office.         Signed, Merri Ray, MD [facility]

## 2019-02-15 LAB — COMPREHENSIVE METABOLIC PANEL
AG Ratio: 1.5 (calc) (ref 1.0–2.5)
ALT: 13 U/L (ref 9–46)
AST: 13 U/L (ref 10–35)
Albumin: 4.2 g/dL (ref 3.6–5.1)
Alkaline phosphatase (APISO): 102 U/L (ref 35–144)
BUN: 11 mg/dL (ref 7–25)
CO2: 24 mmol/L (ref 20–32)
Calcium: 9.4 mg/dL (ref 8.6–10.3)
Chloride: 105 mmol/L (ref 98–110)
Creat: 0.85 mg/dL (ref 0.70–1.33)
Globulin: 2.8 g/dL (calc) (ref 1.9–3.7)
Glucose, Bld: 93 mg/dL (ref 65–99)
Potassium: 4.1 mmol/L (ref 3.5–5.3)
Sodium: 140 mmol/L (ref 135–146)
Total Bilirubin: 1.7 mg/dL — ABNORMAL HIGH (ref 0.2–1.2)
Total Protein: 7 g/dL (ref 6.1–8.1)

## 2019-02-15 LAB — LIPID PANEL
Cholesterol: 129 mg/dL (ref <200)
HDL: 38 mg/dL — ABNORMAL LOW (ref >=40)
LDL Cholesterol (Calc): 69 mg/dL (calc)
Non-HDL Cholesterol (Calc): 91 mg/dL (calc) (ref <130)
Total CHOL/HDL Ratio: 3.4 (calc) (ref <5.0)
Triglycerides: 132 mg/dL (ref <150)

## 2019-02-15 LAB — PSA: PSA: 2.3 ng/mL (ref <=4.0)

## 2019-08-15 ENCOUNTER — Ambulatory Visit: Payer: Managed Care, Other (non HMO) | Admitting: Family Medicine

## 2019-08-15 ENCOUNTER — Other Ambulatory Visit: Payer: Self-pay

## 2019-08-15 ENCOUNTER — Encounter: Payer: Self-pay | Admitting: Family Medicine

## 2019-08-15 VITALS — BP 130/86 | HR 70 | Temp 97.9°F | Ht 68.0 in | Wt 160.2 lb

## 2019-08-15 DIAGNOSIS — R351 Nocturia: Secondary | ICD-10-CM

## 2019-08-15 DIAGNOSIS — N401 Enlarged prostate with lower urinary tract symptoms: Secondary | ICD-10-CM

## 2019-08-15 DIAGNOSIS — I6930 Unspecified sequelae of cerebral infarction: Secondary | ICD-10-CM | POA: Diagnosis not present

## 2019-08-15 DIAGNOSIS — I10 Essential (primary) hypertension: Secondary | ICD-10-CM

## 2019-08-15 DIAGNOSIS — E785 Hyperlipidemia, unspecified: Secondary | ICD-10-CM

## 2019-08-15 DIAGNOSIS — I6381 Other cerebral infarction due to occlusion or stenosis of small artery: Secondary | ICD-10-CM

## 2019-08-15 MED ORDER — TAMSULOSIN HCL 0.4 MG PO CAPS: 0.4000 mg | ORAL_CAPSULE | Freq: Every day | ORAL | 1 refills | Status: DC

## 2019-08-15 MED ORDER — CLOPIDOGREL BISULFATE 75 MG PO TABS: 75.0000 mg | ORAL_TABLET | Freq: Every day | ORAL | 3 refills | Status: DC

## 2019-08-15 MED ORDER — LISINOPRIL 10 MG PO TABS: 10.0000 mg | ORAL_TABLET | Freq: Every day | ORAL | 2 refills | Status: DC

## 2019-08-15 MED ORDER — ATORVASTATIN CALCIUM 20 MG PO TABS: 20.0000 mg | ORAL_TABLET | Freq: Every day | ORAL | 2 refills | Status: DC

## 2019-08-15 MED ORDER — GABAPENTIN 300 MG PO CAPS: 300.0000 mg | ORAL_CAPSULE | Freq: Two times a day (BID) | ORAL | 1 refills | Status: DC | PRN

## 2019-08-15 NOTE — Addendum Note (Signed)
Addended by: Kittie Plater, Kim Oki HUA on: 08/15/2019 10:34 AM   Modules accepted: Orders

## 2019-08-15 NOTE — Progress Notes (Signed)
Subjective:  Patient ID: Robert Sandoval, male    DOB: May 19, 1960  Age: 59 y.o. MRN: [id number]  CC:  Chief Complaint  Patient presents with  . Medical Management of Chronic Issues    6 m f/u   . Hyperlipidemia    HPI Calyx Hawker presents for   Hypertension: Lisinopril 10 mg daily. No new side effects.  Home readings: 140/82, sometimes lower.    BP Readings from Last 3 Encounters:  08/15/19 (!) 130/86  02/14/19 115/78  12/06/18 106/64   Lab Results  Component Value Date   CREATININE 0.85 02/14/2019    Hyperlipidemia: Lipitor 20 mg daily, goal of less than 70 LDL with history of CVA No new myalgias. Taking med daily.  Lab Results  Component Value Date   CHOL 129 02/14/2019   HDL 38 (L) 02/14/2019   LDLCALC 69 02/14/2019   TRIG 132 02/14/2019   CHOLHDL 3.4 02/14/2019   Lab Results  Component Value Date   ALT 13 02/14/2019   AST 13 02/14/2019   ALKPHOS 73 06/08/2017   BILITOT 1.7 (H) 02/14/2019    History of ischemic right basal ganglion stroke With initial deficit of spastic hemiparesis of left nondominant side, hemiparesis.  Treated initially with TPA.  Treated with Lipitor, Plavix, physical medicine rehab.  Symptoms had improved, was released by neurology when discussed in January.  Gabapentin 600 mg 3 times daily for dysesthesias with weaning as tolerated, previously on amantadine 200 mg with breakfast and lunch for fatigue with weaning as tolerated.  Gabapentin twice daily, amantadine once per day at last visit  Currently on amantadine in morning helps with fatigue.  gabapentin 338m BID - treating tingling - almost gone No new bleeding with plavix alone. (intial DAPT - just plaivix now).  No new weakness.   BPH: Takes flomax once per day.  Nocturia 2 times per night. Stable.  PSA in 11/2017 with urology - Dr. BGloriann Loan 2.34.  appt in 02/2019.  Lab Results  Component Value Date   PSA1 2.1 08/05/2016   PSA 2.3 02/14/2019   PSA 2.23 09/14/2014   PSA 1.85  03/17/2014     History Patient Active Problem List   Diagnosis Date Noted  . Other fatigue 01/03/2018  . Alteration of sensation as late effect of stroke 09/07/2017  . History of cerebrovascular accident 08/22/2017  . Low back pain 08/22/2017  . Spinal stenosis of lumbar region 08/21/2017  . Degeneration of lumbar intervertebral disc 08/07/2017  . Lumbar radiculopathy 08/07/2017  . Hypotension due to drugs   . Labile blood pressure   . Benign prostatic hyperplasia   . Hemiparesis affecting left side as late effect of stroke ([facility]   . Basal ganglia infarction ([facility] 06/07/2017  . 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 ([facility]   . Gait disturbance, post-stroke   . Hyperlipidemia   . Acute ischemic stroke ([facility] - R basal ganglia s/p IV tPA 06/05/2017  . Prostate nodule 10/30/2016  . Essential hypertension 08/05/2016  . External hemorrhoids without complication 161/44/3154 . Melanosis of colon 11/23/2015  . Diverticulosis of colon without hemorrhage 11/23/2015  . Internal hemorrhoids 11/23/2015  . Essential hypertension, benign 09/14/2014   Past Medical History:  Diagnosis Date  . Allergy   . BPH (benign prostatic hyperplasia)    followed by [facility]  . Hyperlipidemia   . Hypertension   . Low back pain radiating to left lower extremity    sees Dr. BTonita Cong .  [facility])    05/2017   Past Surgical History:  Procedure Laterality Date  . COLONOSCOPY    . ESOPHAGOGASTRODUODENOSCOPY    . REZUM     No Known Allergies Prior to Admission medications   Medication Sig Start Date End Date Taking? Authorizing Provider  amantadine (SYMMETREL) 100 MG capsule TAKE 2 CAPSULES (200 MG TOTAL) BY MOUTH 2 (TWO) TIMES DAILY WITH BREAKFAST AND LUNCH FOR 30 DAYS. 11/11/18 08/15/19 Yes Jamse Arn, MD  atorvastatin (LIPITOR) 20 MG tablet Take 1 tablet (20 mg total) by mouth daily at 6 PM. 02/14/19  Yes [name], MD  clopidogrel  (PLAVIX) 75 MG tablet Take 1 tablet (75 mg total) by mouth daily. 02/14/19  Yes [name], MD  fluticasone (FLONASE) 50 MCG/ACT nasal spray Place 1 spray into both nostrils daily. 06/13/17  Yes Love, [name], PA-C  gabapentin (NEURONTIN) 600 MG tablet TAKE 1 TABLET BY MOUTH THREE TIMES A DAY 10/28/18  Yes Patel, Domenick Bookbinder, MD  lisinopril (ZESTRIL) 10 MG tablet Take 1 tablet (10 mg total) by mouth daily. 02/14/19  Yes [name], MD  loratadine (CLARITIN) 10 MG tablet Take 10 mg by mouth daily as needed for allergies.   Yes [provider]  polyethylene glycol (MIRALAX / GLYCOLAX) 17 g packet Take 17 g by mouth daily.   Yes [provider]  tamsulosin (FLOMAX) 0.4 MG CAPS capsule Take 0.4 mg by mouth daily.   Yes [provider]   Social History   Socioeconomic History  . Marital status: Married    Spouse name: Not on file  . Number of children: 1  . Years of education: Not on file  . Highest education level: High school graduate  Occupational History  . Occupation: driver  Tobacco Use  . Smoking status: Never Smoker  . Smokeless tobacco: Never Used  Vaping Use  . Vaping Use: Never used  Substance and Sexual Activity  . Alcohol use: Yes    Comment: Once a month   . Drug use: No  . Sexual activity: Not on file  Other Topics Concern  . Not on file  Social History Narrative   Lives at home with his wife   Right handed   Caffeine: 1 cup of coffee in the mornings    Social Determinants of Health   Financial Resource Strain:   . Difficulty of Paying Living Expenses:   Food Insecurity:   . Worried About Charity fundraiser in the Last Year:   . Arboriculturist in the Last Year:   Transportation Needs:   . Film/video editor (Medical):   Marland Kitchen Lack of Transportation (Non-Medical):   Physical Activity:   . Days of Exercise per Week:   . Minutes of Exercise per Session:   Stress:   . Feeling of Stress :   Social Connections:   .  Frequency of Communication with Friends and Family:   . Frequency of Social Gatherings with Friends and Family:   . Attends Religious Services:   . Active Member of Clubs or Organizations:   . Attends Archivist Meetings:   Marland Kitchen Marital Status:   Intimate Partner Violence:   . Fear of Current or Ex-Partner:   . Emotionally Abused:   Marland Kitchen Physically Abused:   . Sexually Abused:     Review of Systems  Constitutional: Negative for fatigue and unexpected weight change.  Eyes: Negative for visual disturbance.  Respiratory: Negative for  cough, chest tightness and shortness of breath.   Cardiovascular: Negative for chest pain, palpitations and leg swelling.  Gastrointestinal: Negative for abdominal pain and blood in stool.  Neurological: Positive for dizziness (rare with gabapentin. ). Negative for speech difficulty, weakness, light-headedness and headaches.     Objective:   Vitals:   08/15/19 0940 08/15/19 0946  BP: (!) 153/92 (!) 130/86  Pulse: 70   Temp: 97.9 F (36.6 C)   TempSrc: Temporal   SpO2: 99%   Weight: 160 lb 3.2 oz (72.7 kg)   Height: 5' 8" (1.727 m)      Physical Exam Vitals reviewed.  Constitutional:      Appearance: He is well-developed.  HENT:     Head: Normocephalic and atraumatic.  Eyes:     Pupils: Pupils are equal, round, and reactive to light.  Neck:     Vascular: No carotid bruit or JVD.  Cardiovascular:     Rate and Rhythm: Normal rate and regular rhythm.     Heart sounds: Normal heart sounds. No murmur heard.   Pulmonary:     Effort: Pulmonary effort is normal.     Breath sounds: Normal breath sounds. No rales.  Skin:    General: Skin is warm and dry.  Neurological:     General: No focal deficit present.     Mental Status: He is alert and oriented to person, place, and time.     GCS: GCS eye subscore is 4. GCS verbal subscore is 5. GCS motor subscore is 6.     Cranial Nerves: No facial asymmetry.     Motor: No weakness, tremor or  pronator drift.     Coordination: Coordination is intact. Romberg sign negative.     Gait: Gait is intact.  Psychiatric:        Mood and Affect: Mood normal.        Behavior: Behavior normal.        Assessment & Plan:  Sneijder Bernards is a 59 y.o. male . Hyperlipidemia, unspecified hyperlipidemia type - Plan: atorvastatin (LIPITOR) 20 MG tablet, Lipid panel  -Tolerating Lipitor, controlled with LDL less than 70 in January, repeat labs.  Continue same dose.  Cerebrovascular accident (CVA) of basal ganglia ([place]) - Plan: clopidogrel (PLAVIX) 75 MG tablet, Lipid panel, gabapentin (NEURONTIN) 300 MG capsule  -Tolerating Plavix without new areas of bleeding.  Continue same.  Goal blood pressure less than 130/80, LDL less than 70, home monitoring of blood pressure discussed with potential need for additional medication if elevated.  -Decreased dysesthesias, some dizziness at times with higher dose gabapentin.  Will decrease to 300 mg with option of 600 mg dosing.  Twice daily dosing.  Fatigue has improved, option of tapering off amantadine.  Can refill if needed.  Essential hypertension - Plan: CMP14+EGFR, lisinopril (ZESTRIL) 10 MG tablet  -Borderline control as above, continue lisinopril same dose now with home monitoring, RTC precautions  Benign prostatic hyperplasia with nocturia - Plan: tamsulosin (FLOMAX) 0.4 MG CAPS capsule  -Continue Flomax, follow-up with urology if persistent nocturia.  Consider 0.8 mg dosing of Flomax.  Avoiding changes for now with episodic dizziness with gabapentin.  Meds ordered this encounter  Medications  . atorvastatin (LIPITOR) 20 MG tablet    Sig: Take 1 tablet (20 mg total) by mouth daily at 6 PM.    Dispense:  90 tablet    Refill:  2  . clopidogrel (PLAVIX) 75 MG tablet    Sig: Take 1 tablet (75 mg  total) by mouth daily.    Dispense:  90 tablet    Refill:  3  . tamsulosin (FLOMAX) 0.4 MG CAPS capsule    Sig: Take 1 capsule (0.4 mg total) by mouth  daily.    Dispense:  90 capsule    Refill:  1  . gabapentin (NEURONTIN) 300 MG capsule    Sig: Take 1-2 capsules (300-600 mg total) by mouth 2 (two) times daily as needed.    Dispense:  360 capsule    Refill:  1  . lisinopril (ZESTRIL) 10 MG tablet    Sig: Take 1 tablet (10 mg total) by mouth daily.    Dispense:  90 tablet    Refill:  2   Patient Instructions      If home readings are over 130/80 - let me know as we may need to adjust meds with your history of stroke.   Option of stopping amantadine if fatigue resolved and can decrease gabapentin dosing or frequency if nerve symptoms resolve. (you can take 1-2 of the 373m dose)  flomax refilled, but can follow up with urology to discuss med changes if continued nighttime urination.   Return to the clinic or go to the nearest emergency room if any of your symptoms worsen or new symptoms occur.   How to Take Your Blood Pressure Blood pressure is a measurement of how strongly your blood is pressing against the walls of your arteries. Arteries are blood vessels that carry blood from your heart throughout your body. Your health care provider takes your blood pressure at each office visit. You can also take your own blood pressure at home with a blood pressure machine. You may need to take your own blood pressure:  To confirm a diagnosis of high blood pressure (hypertension).  To monitor your blood pressure over time.  To make sure your blood pressure medicine is working. Supplies needed: To take your blood pressure, you will need a blood pressure machine. You can buy a blood pressure machine, or blood pressure monitor, at most drugstores or online. There are several types of home blood pressure monitors. When choosing one, consider the following:  Choose a monitor that has an arm cuff.  Choose a cuff that wraps snugly around your upper arm. You should be able to fit only one finger between your arm and the cuff.  Do not choose a  monitor that measures your blood pressure from your wrist or finger. Your health care provider can suggest a reliable monitor that will meet your needs. How to prepare To get the most accurate reading, avoid the following for 30 minutes before you check your blood pressure:  Drinking caffeine.  Drinking alcohol.  Eating.  Smoking.  Exercising. Five minutes before you check your blood pressure:  Empty your bladder.  Sit quietly without talking in a dining chair, rather than in a soft couch or armchair. How to take your blood pressure To check your blood pressure, follow the instructions in the manual that came with your blood pressure monitor. If you have a digital blood pressure monitor, the instructions may be as follows: 1. Sit up straight. 2. Place your feet on the floor. Do not cross your ankles or legs. 3. Rest your left arm at the level of your heart on a table or desk or on the arm of a chair. 4. Pull up your shirt sleeve. 5. Wrap the blood pressure cuff around the upper part of your left arm, 1 inch (2.5  cm) above your elbow. It is best to wrap the cuff around bare skin. 6. Fit the cuff snugly around your arm. You should be able to place only one finger between the cuff and your arm. 7. Position the cord inside the groove of your elbow. 8. Press the power button. 9. Sit quietly while the cuff inflates and deflates. 10. Read the digital reading on the monitor screen and write it down (record it). 11. Wait 2-3 minutes, then repeat the steps, starting at step 1. What does my blood pressure reading mean? A blood pressure reading consists of a higher number over a lower number. Ideally, your blood pressure should be below 120/80. The first ("top") number is called the systolic pressure. It is a measure of the pressure in your arteries as your heart beats. The second ("bottom") number is called the diastolic pressure. It is a measure of the pressure in your arteries as the heart  relaxes. Blood pressure is classified into four stages. The following are the stages for adults who do not have a short-term serious illness or a chronic condition. Systolic pressure and diastolic pressure are measured in a unit called mm Hg. Normal  Systolic pressure: below 456.  Diastolic pressure: below 80. Elevated  Systolic pressure: 256-389.  Diastolic pressure: below 80. Hypertension stage 1  Systolic pressure: 373-428.  Diastolic pressure: 76-81. Hypertension stage 2  Systolic pressure: 157 or above.  Diastolic pressure: 90 or above. You can have prehypertension or hypertension even if only the systolic or only the diastolic number in your reading is higher than normal. Follow these instructions at home:  Check your blood pressure as often as recommended by your health care provider.  Take your monitor to the next appointment with your health care provider to make sure: ? That you are using it correctly. ? That it provides accurate readings.  Be sure you understand what your goal blood pressure numbers are.  Tell your health care provider if you are having any side effects from blood pressure medicine. Contact a health care provider if:  Your blood pressure is consistently high. Get help right away if:  Your systolic blood pressure is higher than 180.  Your diastolic blood pressure is higher than 110. This information is not intended to replace advice given to you by your health care provider. Make sure you discuss any questions you have with your health care provider. Document Revised: 12/15/2016 Document Reviewed: 06/11/2015 Elsevier Patient Education  [employer].   If you have lab work done today you will be contacted with your lab results within the next 2 weeks.  If you have not heard from [place] then please contact us. The fastest way to get your results is to register for My Chart.   IF you received an x-ray today, you will receive an invoice from  [facility]. Please contact [facility] at [phone] with questions or concerns regarding your invoice.   IF you received labwork today, you will receive an invoice from [facility]. Please contact [facility] at [phone] with questions or concerns regarding your invoice.   Our billing staff will not be able to assist you with questions regarding bills from these companies.  You will be contacted with the lab results as soon as they are available. The fastest way to get your results is to activate your My Chart account. Instructions are located on the last page of this paperwork. If you have not heard from [place] regarding the results in 2  weeks, please contact this office.          Signed, Merri Ray, MD [facility]

## 2019-08-15 NOTE — Patient Instructions (Addendum)
If home readings are over 130/80 - let me know as we may need to adjust meds with your history of stroke.   Option of stopping amantadine if fatigue resolved and can decrease gabapentin dosing or frequency if nerve symptoms resolve. (you can take 1-2 of the 300mg  dose)  flomax refilled, but can follow up with urology to discuss med changes if continued nighttime urination.   Return to the clinic or go to the nearest emergency room if any of your symptoms worsen or new symptoms occur.   How to Take Your Blood Pressure Blood pressure is a measurement of how strongly your blood is pressing against the walls of your arteries. Arteries are blood vessels that carry blood from your heart throughout your body. Your health care provider takes your blood pressure at each office visit. You can also take your own blood pressure at home with a blood pressure machine. You may need to take your own blood pressure:  To confirm a diagnosis of high blood pressure (hypertension).  To monitor your blood pressure over time.  To make sure your blood pressure medicine is working. Supplies needed: To take your blood pressure, you will need a blood pressure machine. You can buy a blood pressure machine, or blood pressure monitor, at most drugstores or online. There are several types of home blood pressure monitors. When choosing one, consider the following:  Choose a monitor that has an arm cuff.  Choose a cuff that wraps snugly around your upper arm. You should be able to fit only one finger between your arm and the cuff.  Do not choose a monitor that measures your blood pressure from your wrist or finger. Your health care provider can suggest a reliable monitor that will meet your needs. How to prepare To get the most accurate reading, avoid the following for 30 minutes before you check your blood pressure:  Drinking caffeine.  Drinking alcohol.  Eating.  Smoking.  Exercising. Five minutes  before you check your blood pressure:  Empty your bladder.  Sit quietly without talking in a dining chair, rather than in a soft couch or armchair. How to take your blood pressure To check your blood pressure, follow the instructions in the manual that came with your blood pressure monitor. If you have a digital blood pressure monitor, the instructions may be as follows: 1. Sit up straight. 2. Place your feet on the floor. Do not cross your ankles or legs. 3. Rest your left arm at the level of your heart on a table or desk or on the arm of a chair. 4. Pull up your shirt sleeve. 5. Wrap the blood pressure cuff around the upper part of your left arm, 1 inch (2.5 cm) above your elbow. It is best to wrap the cuff around bare skin. 6. Fit the cuff snugly around your arm. You should be able to place only one finger between the cuff and your arm. 7. Position the cord inside the groove of your elbow. 8. Press the power button. 9. Sit quietly while the cuff inflates and deflates. 10. Read the digital reading on the monitor screen and write it down (record it). 11. Wait 2-3 minutes, then repeat the steps, starting at step 1. What does my blood pressure reading mean? A blood pressure reading consists of a higher number over a lower number. Ideally, your blood pressure should be below 120/80. The first ("top") number is called the systolic pressure. It is a measure of  the pressure in your arteries as your heart beats. The second ("bottom") number is called the diastolic pressure. It is a measure of the pressure in your arteries as the heart relaxes. Blood pressure is classified into four stages. The following are the stages for adults who do not have a short-term serious illness or a chronic condition. Systolic pressure and diastolic pressure are measured in a unit called mm Hg. Normal  Systolic pressure: below 009.  Diastolic pressure: below 80. Elevated  Systolic pressure: 381-829.  Diastolic  pressure: below 80. Hypertension stage 1  Systolic pressure: 937-169.  Diastolic pressure: 67-89. Hypertension stage 2  Systolic pressure: 381 or above.  Diastolic pressure: 90 or above. You can have prehypertension or hypertension even if only the systolic or only the diastolic number in your reading is higher than normal. Follow these instructions at home:  Check your blood pressure as often as recommended by your health care provider.  Take your monitor to the next appointment with your health care provider to make sure: ? That you are using it correctly. ? That it provides accurate readings.  Be sure you understand what your goal blood pressure numbers are.  Tell your health care provider if you are having any side effects from blood pressure medicine. Contact a health care provider if:  Your blood pressure is consistently high. Get help right away if:  Your systolic blood pressure is higher than 180.  Your diastolic blood pressure is higher than 110. This information is not intended to replace advice given to you by your health care provider. Make sure you discuss any questions you have with your health care provider. Document Revised: 12/15/2016 Document Reviewed: 06/11/2015 Elsevier Patient Education  [employer].   If you have lab work done today you will be contacted with your lab results within the next 2 weeks.  If you have not heard from [place] then please contact us. The fastest way to get your results is to register for My Chart.   IF you received an x-ray today, you will receive an invoice from [facility]. Please contact [facility] at [phone] with questions or concerns regarding your invoice.   IF you received labwork today, you will receive an invoice from [facility]. Please contact [facility] at [phone] with questions or concerns regarding your invoice.   Our billing staff will not be able to assist you with questions regarding  bills from these companies.  You will be contacted with the lab results as soon as they are available. The fastest way to get your results is to activate your My Chart account. Instructions are located on the last page of this paperwork. If you have not heard from [place] regarding the results in 2 weeks, please contact this office.

## 2019-08-16 LAB — COMPLETE METABOLIC PANEL WITH GFR
AG Ratio: 1.5 (calc) (ref 1.0–2.5)
ALT: 15 U/L (ref 9–46)
AST: 18 U/L (ref 10–35)
Albumin: 4.1 g/dL (ref 3.6–5.1)
Alkaline phosphatase (APISO): 97 U/L (ref 35–144)
BUN: 9 mg/dL (ref 7–25)
CO2: 26 mmol/L (ref 20–32)
Calcium: 9.1 mg/dL (ref 8.6–10.3)
Chloride: 107 mmol/L (ref 98–110)
Creat: 0.89 mg/dL (ref 0.70–1.33)
GFR, Est African American: 108 mL/min/{1.73_m2} (ref >=60)
GFR, Est Non African American: 94 mL/min/{1.73_m2} (ref >=60)
Globulin: 2.8 g/dL (calc) (ref 1.9–3.7)
Glucose, Bld: 90 mg/dL (ref 65–99)
Potassium: 4.1 mmol/L (ref 3.5–5.3)
Sodium: 140 mmol/L (ref 135–146)
Total Bilirubin: 1 mg/dL (ref 0.2–1.2)
Total Protein: 6.9 g/dL (ref 6.1–8.1)

## 2019-08-16 LAB — EXTRA LAV TOP TUBE

## 2019-08-16 LAB — LIPID PANEL
Cholesterol: 110 mg/dL (ref <200)
HDL: 38 mg/dL — ABNORMAL LOW (ref >=40)
LDL Cholesterol (Calc): 49 mg/dL (calc)
Non-HDL Cholesterol (Calc): 72 mg/dL (calc) (ref <130)
Total CHOL/HDL Ratio: 2.9 (calc) (ref <5.0)
Triglycerides: 150 mg/dL — ABNORMAL HIGH (ref <150)

## 2019-09-08 ENCOUNTER — Encounter: Payer: Self-pay | Admitting: Family Medicine

## 2019-09-08 ENCOUNTER — Ambulatory Visit: Payer: 59 | Admitting: Family Medicine

## 2019-09-08 ENCOUNTER — Other Ambulatory Visit: Payer: Self-pay

## 2019-09-08 VITALS — BP 120/78 | HR 78 | Temp 98.3°F | Ht 68.0 in | Wt 162.0 lb

## 2019-09-08 DIAGNOSIS — I10 Essential (primary) hypertension: Secondary | ICD-10-CM

## 2019-09-08 DIAGNOSIS — Z23 Encounter for immunization: Secondary | ICD-10-CM | POA: Diagnosis not present

## 2019-09-08 NOTE — Patient Instructions (Addendum)
Bring meter to nurse visit in next week if possible to see if accurate. No change in meds for now. I also recommend upper arm meter.  Return to the clinic or go to the nearest emergency room if any of your symptoms worsen or new symptoms occur.     How to Take Your Blood Pressure You can take your blood pressure at home with a machine. You may need to check your blood pressure at home:  To check if you have high blood pressure (hypertension).  To check your blood pressure over time.  To make sure your blood pressure medicine is working. Supplies needed: You will need a blood pressure machine, or monitor. You can buy one at a drugstore or online. When choosing one:  Choose one with an arm cuff.  Choose one that wraps around your upper arm. Only one finger should fit between your arm and the cuff.  Do not choose one that measures your blood pressure from your wrist or finger. Your doctor can suggest a monitor. How to prepare Avoid these things for 30 minutes before checking your blood pressure:  Drinking caffeine.  Drinking alcohol.  Eating.  Smoking.  Exercising. Five minutes before checking your blood pressure:  Pee.  Sit in a dining chair. Avoid sitting in a soft couch or armchair.  Be quiet. Do not talk. How to take your blood pressure Follow the instructions that came with your machine. If you have a digital blood pressure monitor, these may be the instructions: 1. Sit up straight. 2. Place your feet on the floor. Do not cross your ankles or legs. 3. Rest your left arm at the level of your heart. You may rest it on a table, desk, or chair. 4. Pull up your shirt sleeve. 5. Wrap the blood pressure cuff around the upper part of your left arm. The cuff should be 1 inch (2.5 cm) above your elbow. It is best to wrap the cuff around bare skin. 6. Fit the cuff snugly around your arm. You should be able to place only one finger between the cuff and your arm. 7. Put the  cord inside the groove of your elbow. 8. Press the power button. 9. Sit quietly while the cuff fills with air and loses air. 10. Write down the numbers on the screen. 11. Wait 2-3 minutes and then repeat steps 1-10. What do the numbers mean? Two numbers make up your blood pressure. The first number is called systolic pressure. The second is called diastolic pressure. An example of a blood pressure reading is "120 over 80" (or 120/80). If you are an adult and do not have a medical condition, use this guide to find out if your blood pressure is normal: Normal  First number: below 120.  Second number: below 80. Elevated  First number: 120-129.  Second number: below 80. Hypertension stage 1  First number: 130-139.  Second number: 80-89. Hypertension stage 2  First number: 140 or above.  Second number: 90 or above. Your blood pressure is above normal even if only the top or bottom number is above normal. Follow these instructions at home:  Check your blood pressure as often as your doctor tells you to.  Take your monitor to your next doctor's appointment. Your doctor will: ? Make sure you are using it correctly. ? Make sure it is working right.  Make sure you understand what your blood pressure numbers should be.  Tell your doctor if your medicines are causing  side effects. Contact a doctor if:  Your blood pressure keeps being high. Get help right away if:  Your first blood pressure number is higher than 180.  Your second blood pressure number is higher than 120. This information is not intended to replace advice given to you by your health care provider. Make sure you discuss any questions you have with your health care provider. Document Revised: 12/15/2016 Document Reviewed: 06/11/2015 Elsevier Patient Education  [employer].    If you have lab work done today you will be contacted with your lab results within the next 2 weeks.  If you have not heard from [place]  then please contact us. The fastest way to get your results is to register for My Chart.   IF you received an x-ray today, you will receive an invoice from [facility]. Please contact [facility] at [phone] with questions or concerns regarding your invoice.   IF you received labwork today, you will receive an invoice from [place]. Please contact [facility] at [phone] with questions or concerns regarding your invoice.   Our billing staff will not be able to assist you with questions regarding bills from these companies.  You will be contacted with the lab results as soon as they are available. The fastest way to get your results is to activate your My Chart account. Instructions are located on the last page of this paperwork. If you have not heard from [place] regarding the results in 2 weeks, please contact this office.

## 2019-09-08 NOTE — Progress Notes (Signed)
Subjective:  Patient ID: Robert Sandoval, male    DOB: [birth year]  Age: 59 y.o. MRN: [id number]  CC:  Chief Complaint  Patient presents with  . Hypertension    Pt reports his BP isn't getting any better. Pt takes his BP 2 x a day and in the morning prior to taking medication his BP reading is above 140/90. pt also takes his BP at night before bed and his BP is in normal range. Pt reports no physcial symptoms of hypertension other than headaches, but atribute it to seasonal allergies. pt reports he takes his medication as prescribed with no known side effects.    HPI Ascher Schroepfer presents for   Hypertension: Follow up form 7/30 visit. Hx of CVA. Borderline control last visit - remained on 10mg  lisinopril.  Decreased gabapentin last visit d/t side effects. Less dizzy - new dose is better.  Some HA with seasonal allergies.  Home readings elevated - above 140/90-100.  HR in 90's. Wrist meter.   BP Readings from Last 3 Encounters:  09/08/19 120/78  08/15/19 (!) 130/86  02/14/19 115/78   Lab Results  Component Value Date   CREATININE 0.89 08/15/2019      History Patient Active Problem List   Diagnosis Date Noted  . Other fatigue 01/03/2018  . Alteration of sensation as late effect of stroke 09/07/2017  . History of cerebrovascular accident 08/22/2017  . Low back pain 08/22/2017  . Spinal stenosis of lumbar region 08/21/2017  . Degeneration of lumbar intervertebral disc 08/07/2017  . Lumbar radiculopathy 08/07/2017  . Hypotension due to drugs   . Labile blood pressure   . Benign prostatic hyperplasia   . Hemiparesis affecting left side as late effect of stroke ([place])   . Basal ganglia infarction ([place]) 06/07/2017  . 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 ([place])   . Gait disturbance, post-stroke   . Hyperlipidemia   . Acute ischemic stroke ([place]) - R basal ganglia s/p IV tPA 06/05/2017  . Prostate nodule 10/30/2016  . Essential hypertension  08/05/2016  . External hemorrhoids without complication 23/55/7322  . Melanosis of colon 11/23/2015  . Diverticulosis of colon without hemorrhage 11/23/2015  . Internal hemorrhoids 11/23/2015  . Essential hypertension, benign 09/14/2014   Past Medical History:  Diagnosis Date  . Allergy   . BPH (benign prostatic hyperplasia)    followed by [facility]  . Hyperlipidemia   . Hypertension   . Low back pain radiating to left lower extremity    sees Dr. Tonita Cong  . Stroke [facility])    05/2017   Past Surgical History:  Procedure Laterality Date  . COLONOSCOPY    . ESOPHAGOGASTRODUODENOSCOPY    . REZUM     No Known Allergies Prior to Admission medications   Medication Sig Start Date End Date Taking? Authorizing Provider  atorvastatin (LIPITOR) 20 MG tablet Take 1 tablet (20 mg total) by mouth daily at 6 PM. 08/15/19  Yes [name], MD  clopidogrel (PLAVIX) 75 MG tablet Take 1 tablet (75 mg total) by mouth daily. 08/15/19  Yes [name], MD  fluticasone (FLONASE) 50 MCG/ACT nasal spray Place 1 spray into both nostrils daily. 06/13/17  Yes Love, [name], PA-C  gabapentin (NEURONTIN) 300 MG capsule Take 1-2 capsules (300-600 mg total) by mouth 2 (two) times daily as needed. 08/15/19  Yes [name], MD  lisinopril (ZESTRIL) 10 MG tablet Take 1 tablet (10 mg total) by mouth daily. 08/15/19  Yes [name], MD  loratadine (CLARITIN) 10 MG tablet Take 10 mg by mouth daily as needed for allergies.   Yes [provider]  polyethylene glycol (MIRALAX / GLYCOLAX) 17 g packet Take 17 g by mouth daily.   Yes [provider]  tamsulosin (FLOMAX) 0.4 MG CAPS capsule Take 1 capsule (0.4 mg total) by mouth daily. 08/15/19  Yes [name], MD  amantadine (SYMMETREL) 100 MG capsule TAKE 2 CAPSULES (200 MG TOTAL) BY MOUTH 2 (TWO) TIMES DAILY WITH BREAKFAST AND LUNCH FOR 30 DAYS. 11/11/18 08/15/19  Jamse Arn, MD   Social History   Socioeconomic  History  . Marital status: Married    Spouse name: Not on file  . Number of children: 1  . Years of education: Not on file  . Highest education level: High school graduate  Occupational History  . Occupation: driver  Tobacco Use  . Smoking status: Never Smoker  . Smokeless tobacco: Never Used  Vaping Use  . Vaping Use: Never used  Substance and Sexual Activity  . Alcohol use: Yes    Comment: Once a month   . Drug use: No  . Sexual activity: Not on file  Other Topics Concern  . Not on file  Social History Narrative   Lives at home with his wife   Right handed   Caffeine: 1 cup of coffee in the mornings    Social Determinants of Health   Financial Resource Strain:   . Difficulty of Paying Living Expenses: Not on file  Food Insecurity:   . Worried About Charity fundraiser in the Last Year: Not on file  . Ran Out of Food in the Last Year: Not on file  Transportation Needs:   . Lack of Transportation (Medical): Not on file  . Lack of Transportation (Non-Medical): Not on file  Physical Activity:   . Days of Exercise per Week: Not on file  . Minutes of Exercise per Session: Not on file  Stress:   . Feeling of Stress : Not on file  Social Connections:   . Frequency of Communication with Friends and Family: Not on file  . Frequency of Social Gatherings with Friends and Family: Not on file  . Attends Religious Services: Not on file  . Active Member of Clubs or Organizations: Not on file  . Attends Archivist Meetings: Not on file  . Marital Status: Not on file  Intimate Partner Violence:   . Fear of Current or Ex-Partner: Not on file  . Emotionally Abused: Not on file  . Physically Abused: Not on file  . Sexually Abused: Not on file    Review of Systems  Constitutional: Negative for fatigue and unexpected weight change.  Eyes: Negative for visual disturbance.  Respiratory: Negative for cough, chest tightness and shortness of breath.   Cardiovascular:  Negative for chest pain, palpitations and leg swelling.  Gastrointestinal: Negative for abdominal pain and blood in stool.  Neurological: Positive for headaches (With allergies only.). Negative for dizziness and light-headedness.   Per HPI.   Objective:   Vitals:   09/08/19 1033  BP: 120/78  Pulse: 78  Temp: 98.3 F (36.8 C)  TempSrc: Temporal  SpO2: 98%  Weight: 162 lb (73.5 kg)  Height: 5\' 8"  (1.727 m)     Physical Exam Vitals reviewed.  Constitutional:      Appearance: He is well-developed.  HENT:     Head: Normocephalic and atraumatic.  Eyes:  Pupils: Pupils are equal, round, and reactive to light.  Neck:     Vascular: No carotid bruit or JVD.  Cardiovascular:     Rate and Rhythm: Normal rate and regular rhythm.     Heart sounds: Normal heart sounds. No murmur heard.   Pulmonary:     Effort: Pulmonary effort is normal.     Breath sounds: Normal breath sounds. No rales.  Skin:    General: Skin is warm and dry.  Neurological:     Mental Status: He is alert and oriented to person, place, and time.        Assessment & Plan:  Tullio Chausse is a 59 y.o. male . Essential hypertension  -Possible elevated home readings but in office testing is reassuring.  Question accuracy of home meter.  Plan for nurse only visit with his meter to clarify accuracy, then consider med changes.  Handout given on appropriate monitoring of blood pressure.  No med changes for now.  Need for influenza vaccination - Plan: Flu Vaccine QUAD 36+ mos IM   No orders of the defined types were placed in this encounter.  Patient Instructions    Bring meter to nurse visit in next week if possible to see if accurate. No change in meds for now. I also recommend upper arm meter.  Return to the clinic or go to the nearest emergency room if any of your symptoms worsen or new symptoms occur.     How to Take Your Blood Pressure You can take your blood pressure at home with a machine. You may  need to check your blood pressure at home:  To check if you have high blood pressure (hypertension).  To check your blood pressure over time.  To make sure your blood pressure medicine is working. Supplies needed: You will need a blood pressure machine, or monitor. You can buy one at a drugstore or online. When choosing one:  Choose one with an arm cuff.  Choose one that wraps around your upper arm. Only one finger should fit between your arm and the cuff.  Do not choose one that measures your blood pressure from your wrist or finger. Your doctor can suggest a monitor. How to prepare Avoid these things for 30 minutes before checking your blood pressure:  Drinking caffeine.  Drinking alcohol.  Eating.  Smoking.  Exercising. Five minutes before checking your blood pressure:  Pee.  Sit in a dining chair. Avoid sitting in a soft couch or armchair.  Be quiet. Do not talk. How to take your blood pressure Follow the instructions that came with your machine. If you have a digital blood pressure monitor, these may be the instructions: 1. Sit up straight. 2. Place your feet on the floor. Do not cross your ankles or legs. 3. Rest your left arm at the level of your heart. You may rest it on a table, desk, or chair. 4. Pull up your shirt sleeve. 5. Wrap the blood pressure cuff around the upper part of your left arm. The cuff should be 1 inch (2.5 cm) above your elbow. It is best to wrap the cuff around bare skin. 6. Fit the cuff snugly around your arm. You should be able to place only one finger between the cuff and your arm. 7. Put the cord inside the groove of your elbow. 8. Press the power button. 9. Sit quietly while the cuff fills with air and loses air. 10. Write down the numbers on the screen. 11.  Wait 2-3 minutes and then repeat steps 1-10. What do the numbers mean? Two numbers make up your blood pressure. The first number is called systolic pressure. The second is called  diastolic pressure. An example of a blood pressure reading is "120 over 80" (or 120/80). If you are an adult and do not have a medical condition, use this guide to find out if your blood pressure is normal: Normal  First number: below 120.  Second number: below 80. Elevated  First number: 120-129.  Second number: below 80. Hypertension stage 1  First number: 130-139.  Second number: 80-89. Hypertension stage 2  First number: 140 or above.  Second number: 25 or above. Your blood pressure is above normal even if only the top or bottom number is above normal. Follow these instructions at home:  Check your blood pressure as often as your doctor tells you to.  Take your monitor to your next doctor's appointment. Your doctor will: ? Make sure you are using it correctly. ? Make sure it is working right.  Make sure you understand what your blood pressure numbers should be.  Tell your doctor if your medicines are causing side effects. Contact a doctor if:  Your blood pressure keeps being high. Get help right away if:  Your first blood pressure number is higher than 180.  Your second blood pressure number is higher than 120. This information is not intended to replace advice given to you by your health care provider. Make sure you discuss any questions you have with your health care provider. Document Revised: 12/15/2016 Document Reviewed: 06/11/2015 Elsevier Patient Education  [employer].    If you have lab work done today you will be contacted with your lab results within the next 2 weeks.  If you have not heard from [place] then please contact us. The fastest way to get your results is to register for My Chart.   IF you received an x-ray today, you will receive an invoice from [facility]. Please contact [facility] at [phone] with questions or concerns regarding your invoice.   IF you received labwork today, you will receive an invoice from  [place]. Please contact [facility] at [phone] with questions or concerns regarding your invoice.   Our billing staff will not be able to assist you with questions regarding bills from these companies.  You will be contacted with the lab results as soon as they are available. The fastest way to get your results is to activate your My Chart account. Instructions are located on the last page of this paperwork. If you have not heard from [place] regarding the results in 2 weeks, please contact this office.         Signed, Merri Ray, MD [facility]

## 2019-09-09 ENCOUNTER — Encounter: Payer: Self-pay | Admitting: Family Medicine

## 2019-09-15 ENCOUNTER — Other Ambulatory Visit: Payer: Self-pay

## 2019-09-15 ENCOUNTER — Ambulatory Visit (INDEPENDENT_AMBULATORY_CARE_PROVIDER_SITE_OTHER): Payer: 59 | Admitting: Family Medicine

## 2019-09-15 VITALS — BP 141/99 | HR 68

## 2019-09-15 DIAGNOSIS — Z0130 Encounter for examination of blood pressure without abnormal findings: Secondary | ICD-10-CM

## 2019-09-15 IMAGING — DX PORTABLE CHEST - 1 VIEW
1 series · 1 of 1 positions shown · non-contrast
Comparison: Chest radiograph dated 06/05/2017

CLINICAL DATA: 57-year-old male with cough.

EXAM:
PORTABLE CHEST 1 VIEW

[chest ap]
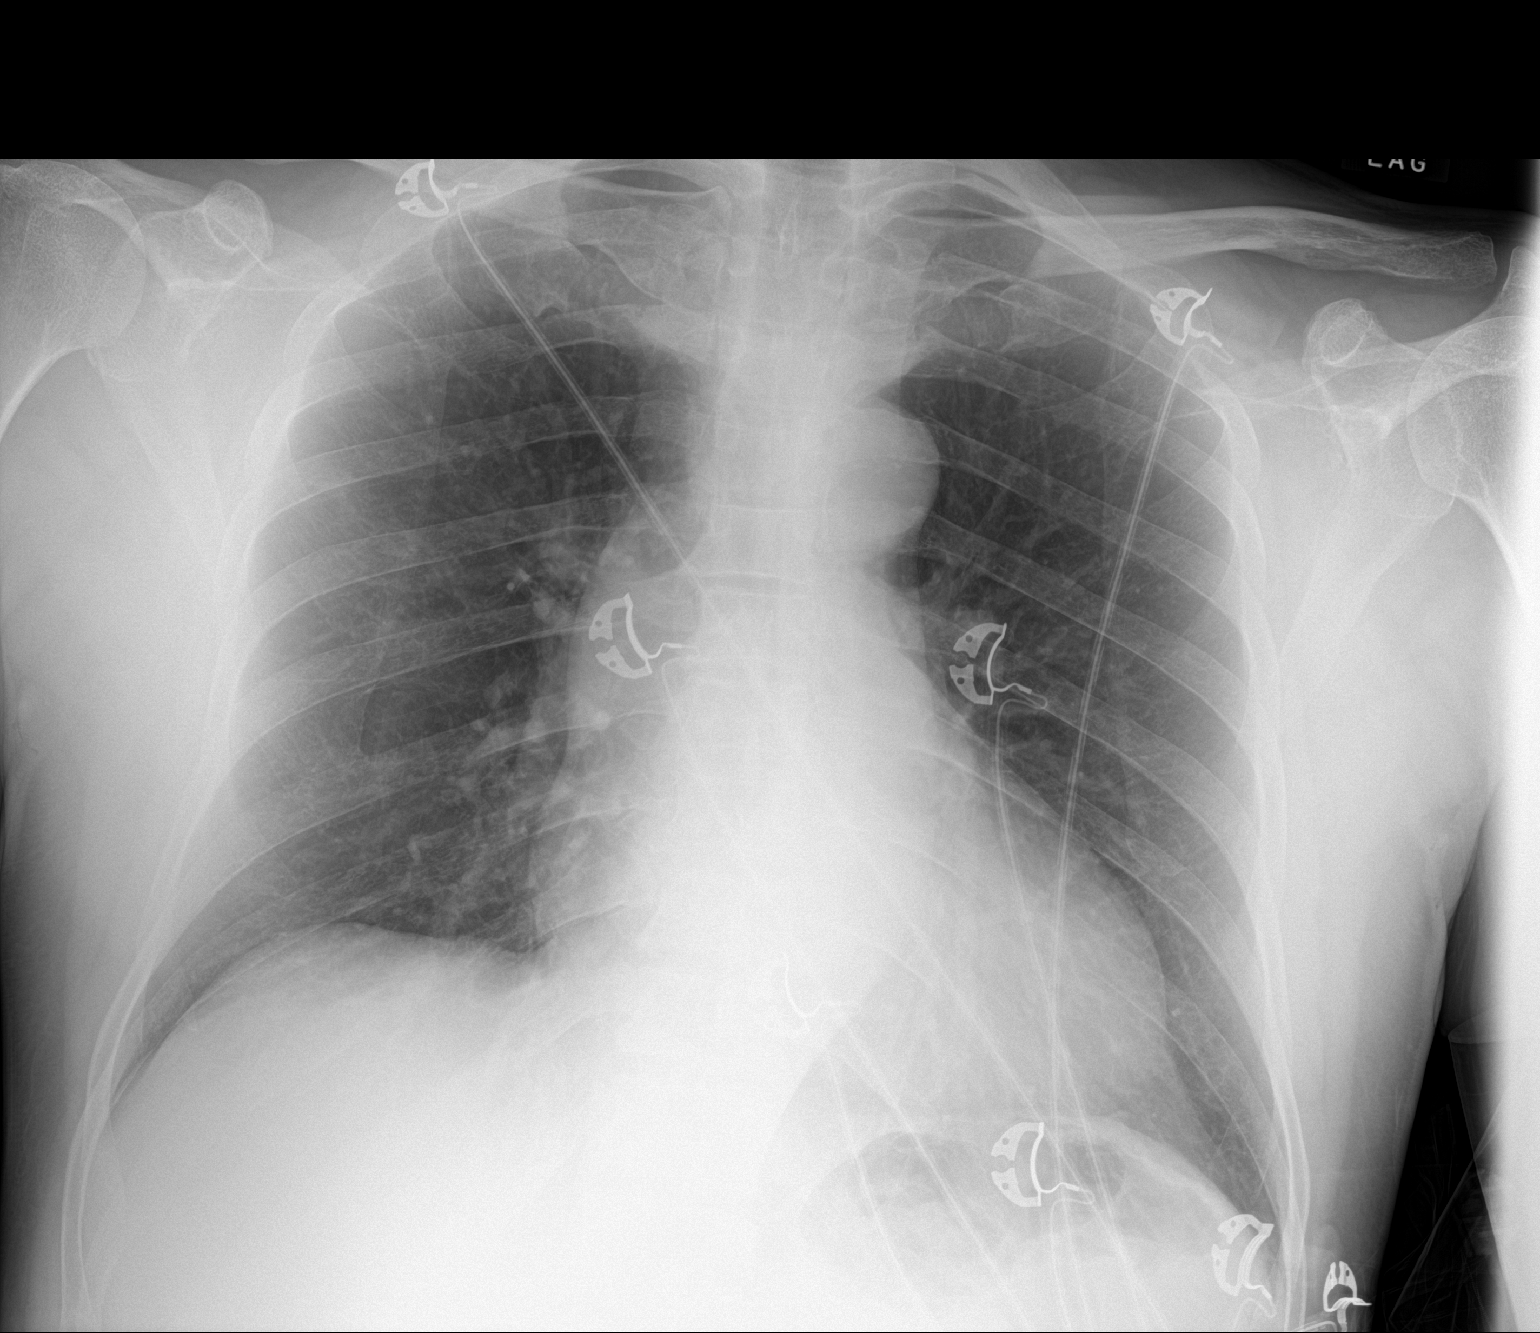

[1 of 1 positions shown; findings below may reference images not displayed]

FINDINGS: The lungs are clear. There is no pleural effusion or pneumothorax.
Stable top-normal cardiac silhouette. The aorta is tortuous. No
acute osseous pathology.
IMPRESSION: No active disease.

## 2019-09-15 NOTE — Progress Notes (Signed)
I have checked pt BP and today the reading was 136/82.   On the wrist home reader it was 141/99 so it looks like it is about a 10 points difference.  I advised that the arm reader was usually better to have and he stated understanding. I have also informed him to take the BP with a grain a salt meaning that it will be a bit elevated with his machine.

## 2019-11-03 ENCOUNTER — Encounter: Payer: Self-pay | Admitting: Family Medicine

## 2019-11-03 ENCOUNTER — Other Ambulatory Visit: Payer: Self-pay

## 2019-11-03 ENCOUNTER — Ambulatory Visit: Payer: 59 | Admitting: Family Medicine

## 2019-11-03 VITALS — BP 130/86 | HR 89 | Temp 98.7°F | Ht 68.0 in | Wt 162.0 lb

## 2019-11-03 DIAGNOSIS — I10 Essential (primary) hypertension: Secondary | ICD-10-CM | POA: Diagnosis not present

## 2019-11-03 NOTE — Progress Notes (Signed)
Subjective:  Patient ID: Robert Sandoval, male    DOB: 22-Aug-1960  Age: 59 y.o. MRN: [id number]  CC:  Chief Complaint  Patient presents with  . Follow-up    on hypertension. Pt reports no issues with BP since last OV. pt reports he has been checking his BP at home and has been getting Readings within normal range. Pt reports no physical symptoms of hypertension. Pt reports he would like any labs sent to [place] presents for   Hypertension: Complicated by history of CVA.  Last visit in August, 10 mg lisinopril.  Questionable accuracy of home meter.  Nurse visit with home meter reading approximately 10 points higher. Home readings:130/90 yesterday - same BP cuff. Has not taken meds yet today.  BP Readings from Last 3 Encounters:  11/03/19 130/86  09/15/19 (!) 141/99  09/08/19 120/78   Lab Results  Component Value Date   CREATININE 0.89 08/15/2019       History Patient Active Problem List   Diagnosis Date Noted  . Other fatigue 01/03/2018  . Alteration of sensation as late effect of stroke 09/07/2017  . History of cerebrovascular accident 08/22/2017  . Low back pain 08/22/2017  . Spinal stenosis of lumbar region 08/21/2017  . Degeneration of lumbar intervertebral disc 08/07/2017  . Lumbar radiculopathy 08/07/2017  . Hypotension due to drugs   . Labile blood pressure   . Benign prostatic hyperplasia   . Hemiparesis affecting left side as late effect of stroke ([place])   . Basal ganglia infarction ([place]) 06/07/2017  . 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 ([place])   . Gait disturbance, post-stroke   . Hyperlipidemia   . Acute ischemic stroke ([place]) - R basal ganglia s/p IV tPA 06/05/2017  . Prostate nodule 10/30/2016  . Essential hypertension 08/05/2016  . External hemorrhoids without complication 02/54/2706  . Melanosis of colon 11/23/2015  . Diverticulosis of colon without hemorrhage 11/23/2015  . Internal hemorrhoids  11/23/2015  . Essential hypertension, benign 09/14/2014   Past Medical History:  Diagnosis Date  . Allergy   . BPH (benign prostatic hyperplasia)    followed by [facility]  . Hyperlipidemia   . Hypertension   . Low back pain radiating to left lower extremity    sees Dr. Tonita Cong  . Stroke [facility])    05/2017   Past Surgical History:  Procedure Laterality Date  . COLONOSCOPY    . ESOPHAGOGASTRODUODENOSCOPY    . REZUM     No Known Allergies Prior to Admission medications   Medication Sig Start Date End Date Taking? Authorizing Provider  atorvastatin (LIPITOR) 20 MG tablet Take 1 tablet (20 mg total) by mouth daily at 6 PM. 08/15/19  Yes [name], MD  clopidogrel (PLAVIX) 75 MG tablet Take 1 tablet (75 mg total) by mouth daily. 08/15/19  Yes [name], MD  fluticasone (FLONASE) 50 MCG/ACT nasal spray Place 1 spray into both nostrils daily. 06/13/17  Yes Love, [name], PA-C  gabapentin (NEURONTIN) 300 MG capsule Take 1-2 capsules (300-600 mg total) by mouth 2 (two) times daily as needed. 08/15/19  Yes [name], MD  lisinopril (ZESTRIL) 10 MG tablet Take 1 tablet (10 mg total) by mouth daily. 08/15/19  Yes [name], MD  loratadine (CLARITIN) 10 MG tablet Take 10 mg by mouth daily as needed for allergies.   Yes [provider]  polyethylene glycol (MIRALAX / GLYCOLAX) 17 g packet  Take 17 g by mouth daily.   Yes [provider]  tamsulosin (FLOMAX) 0.4 MG CAPS capsule Take 1 capsule (0.4 mg total) by mouth daily. 08/15/19  Yes [name], MD  amantadine (SYMMETREL) 100 MG capsule TAKE 2 CAPSULES (200 MG TOTAL) BY MOUTH 2 (TWO) TIMES DAILY WITH BREAKFAST AND LUNCH FOR 30 DAYS. 11/11/18 08/15/19  Jamse Arn, MD   Social History   Socioeconomic History  . Marital status: Married    Spouse name: Not on file  . Number of children: 1  . Years of education: Not on file  . Highest education level: High school graduate    Occupational History  . Occupation: driver  Tobacco Use  . Smoking status: Never Smoker  . Smokeless tobacco: Never Used  Vaping Use  . Vaping Use: Never used  Substance and Sexual Activity  . Alcohol use: Yes    Comment: Once a month   . Drug use: No  . Sexual activity: Not on file  Other Topics Concern  . Not on file  Social History Narrative   Lives at home with his wife   Right handed   Caffeine: 1 cup of coffee in the mornings    Social Determinants of Health   Financial Resource Strain:   . Difficulty of Paying Living Expenses: Not on file  Food Insecurity:   . Worried About Charity fundraiser in the Last Year: Not on file  . Ran Out of Food in the Last Year: Not on file  Transportation Needs:   . Lack of Transportation (Medical): Not on file  . Lack of Transportation (Non-Medical): Not on file  Physical Activity:   . Days of Exercise per Week: Not on file  . Minutes of Exercise per Session: Not on file  Stress:   . Feeling of Stress : Not on file  Social Connections:   . Frequency of Communication with Friends and Family: Not on file  . Frequency of Social Gatherings with Friends and Family: Not on file  . Attends Religious Services: Not on file  . Active Member of Clubs or Organizations: Not on file  . Attends Archivist Meetings: Not on file  . Marital Status: Not on file  Intimate Partner Violence:   . Fear of Current or Ex-Partner: Not on file  . Emotionally Abused: Not on file  . Physically Abused: Not on file  . Sexually Abused: Not on file    Review of Systems  Constitutional: Negative for fatigue and unexpected weight change.  Eyes: Negative for visual disturbance.  Respiratory: Negative for cough, chest tightness and shortness of breath.   Cardiovascular: Negative for chest pain, palpitations and leg swelling.  Gastrointestinal: Negative for abdominal pain and blood in stool.  Neurological: Negative for dizziness, light-headedness and  headaches.     Objective:   Vitals:   11/03/19 1013 11/03/19 1015  BP: (!) 135/91 130/86  Pulse: 89   Temp: 98.7 F (37.1 C)   TempSrc: Temporal   SpO2: 96%   Weight: 162 lb (73.5 kg)   Height: 5\' 8"  (1.727 m)      Physical Exam Vitals reviewed.  Constitutional:      Appearance: He is well-developed.  HENT:     Head: Normocephalic and atraumatic.  Eyes:     Pupils: Pupils are equal, round, and reactive to light.  Neck:     Vascular: No carotid bruit or JVD.  Cardiovascular:     Rate and  Rhythm: Normal rate and regular rhythm.     Heart sounds: Normal heart sounds. No murmur heard.   Pulmonary:     Effort: Pulmonary effort is normal.     Breath sounds: Normal breath sounds. No rales.  Skin:    General: Skin is warm and dry.  Neurological:     Mental Status: He is alert and oriented to person, place, and time.        Assessment & Plan:  Robert Sandoval is a 59 y.o. male . Essential hypertension  -Overall stable on home readings, asymptomatic, borderline readings this morning but has not had medication yet.  Goal of under 130/80, with history of CVA, RTC precautions if elevated readings.  Otherwise recheck 3 months for repeat labs  No orders of the defined types were placed in this encounter.  Patient Instructions       If you have lab work done today you will be contacted with your lab results within the next 2 weeks.  If you have not heard from [place] then please contact us. The fastest way to get your results is to register for My Chart.   IF you received an x-ray today, you will receive an invoice from [facility]. Please contact [facility] at [phone] with questions or concerns regarding your invoice.   IF you received labwork today, you will receive an invoice from [place]. Please contact [facility] at [phone] with questions or concerns regarding your invoice.   Our billing staff will not be able to assist you with questions  regarding bills from these companies.  You will be contacted with the lab results as soon as they are available. The fastest way to get your results is to activate your My Chart account. Instructions are located on the last page of this paperwork. If you have not heard from [place] regarding the results in 2 weeks, please contact this office.         Signed, Merri Ray, MD [facility]

## 2019-11-03 NOTE — Patient Instructions (Addendum)
°  Can try new blood pressure monitor, goal is under 130/80. If higher readings, may need change but remain on same meds for now.  Let me know if there are questions.    If you have lab work done today you will be contacted with your lab results within the next 2 weeks.  If you have not heard from [place] then please contact us. The fastest way to get your results is to register for My Chart.   IF you received an x-ray today, you will receive an invoice from [facility]. Please contact [facility] at [phone] with questions or concerns regarding your invoice.   IF you received labwork today, you will receive an invoice from [place]. Please contact [facility] at [phone] with questions or concerns regarding your invoice.   Our billing staff will not be able to assist you with questions regarding bills from these companies.  You will be contacted with the lab results as soon as they are available. The fastest way to get your results is to activate your My Chart account. Instructions are located on the last page of this paperwork. If you have not heard from [place] regarding the results in 2 weeks, please contact this office.

## 2020-02-16 ENCOUNTER — Ambulatory Visit: Payer: 59 | Admitting: Family Medicine

## 2020-02-16 ENCOUNTER — Other Ambulatory Visit: Payer: Self-pay

## 2020-02-16 ENCOUNTER — Encounter: Payer: Self-pay | Admitting: Family Medicine

## 2020-02-16 VITALS — BP 139/89 | HR 70 | Temp 98.7°F | Resp 16 | Ht 68.0 in | Wt 161.4 lb

## 2020-02-16 DIAGNOSIS — E785 Hyperlipidemia, unspecified: Secondary | ICD-10-CM

## 2020-02-16 DIAGNOSIS — I6381 Other cerebral infarction due to occlusion or stenosis of small artery: Secondary | ICD-10-CM

## 2020-02-16 DIAGNOSIS — N401 Enlarged prostate with lower urinary tract symptoms: Secondary | ICD-10-CM

## 2020-02-16 DIAGNOSIS — R0981 Nasal congestion: Secondary | ICD-10-CM | POA: Diagnosis not present

## 2020-02-16 DIAGNOSIS — I6930 Unspecified sequelae of cerebral infarction: Secondary | ICD-10-CM | POA: Diagnosis not present

## 2020-02-16 DIAGNOSIS — R067 Sneezing: Secondary | ICD-10-CM

## 2020-02-16 DIAGNOSIS — I10 Essential (primary) hypertension: Secondary | ICD-10-CM | POA: Diagnosis not present

## 2020-02-16 DIAGNOSIS — R351 Nocturia: Secondary | ICD-10-CM

## 2020-02-16 MED ORDER — CLOPIDOGREL BISULFATE 75 MG PO TABS: 75.0000 mg | ORAL_TABLET | Freq: Every day | ORAL | 3 refills | Status: DC

## 2020-02-16 MED ORDER — TAMSULOSIN HCL 0.4 MG PO CAPS: 0.4000 mg | ORAL_CAPSULE | Freq: Every day | ORAL | 1 refills | Status: DC

## 2020-02-16 MED ORDER — LISINOPRIL 10 MG PO TABS: 10.0000 mg | ORAL_TABLET | Freq: Every day | ORAL | 2 refills | Status: DC

## 2020-02-16 MED ORDER — GABAPENTIN 300 MG PO CAPS: 300.0000 mg | ORAL_CAPSULE | Freq: Two times a day (BID) | ORAL | 1 refills | Status: DC

## 2020-02-16 MED ORDER — ATORVASTATIN CALCIUM 20 MG PO TABS: 20.0000 mg | ORAL_TABLET | Freq: Every day | ORAL | 2 refills | Status: DC

## 2020-02-16 NOTE — Progress Notes (Signed)
Subjective:  Patient ID: Robert Sandoval, male    DOB: 25-Jan-1960  Age: 60 y.o. MRN: [id number]  CC:  Chief Complaint  Patient presents with  . mediction review    Pt here to go over his current treatments, he is doing well no concerns or refills needed at this time reports no known side effects     HPI Jamaar Bottorff presents for   Hypertension: With history of CVA Overall stable in October.  Continued lisinopril 10 mg daily. Home readings:130/80 range - sometimes 120/70. No 140/90's. Tamsulosin 0.4 mg once per day has been working well for BPH symptoms.  BP Readings from Last 3 Encounters:  02/16/20 139/89  11/03/19 130/86  09/15/19 (!) 141/99   Lab Results  Component Value Date   CREATININE 0.89 08/15/2019   Hyperlipidemia: With history of CVA 2019, right basal ganglia.  With left hemiparesis, alterations of sensation.  Lipitor 20 mg daily.  Also takes Plavix 75 mg daily.,  Gabapentin, with dosage decreased last year.  Amantadine once per day for fatigue, gabapentin for dysesthesias 300mg  BID. Working well, no new side effects.  No new bruising/bleeding.  Fasting today.  Patch of numbness on R wrist that resolved few months ago. No new weakness.   Lab Results  Component Value Date   CHOL 110 08/15/2019   HDL 38 (L) 08/15/2019   LDLCALC 49 08/15/2019   TRIG 150 (H) 08/15/2019   CHOLHDL 2.9 08/15/2019   Lab Results  Component Value Date   ALT 15 08/15/2019   AST 18 08/15/2019   ALKPHOS 73 06/08/2017   BILITOT 1.0 08/15/2019   Rhinorrhea, sneezing: Started 2 days ago. No change in regimen. Some sneezing when around pet birds. 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  No cough. No fever. No HA/myalgia. Clear runny nose and some sneezing. No sick contacts. No covid contacts.  Had covid vaccine x3 - booster in October. More sneezing and congestion today.  Tx: zyrtec. Some relief.    History Patient Active Problem List   Diagnosis Date Noted  . Other fatigue  01/03/2018  . Alteration of sensation as late effect of stroke 09/07/2017  . History of cerebrovascular accident 08/22/2017  . Low back pain 08/22/2017  . Spinal stenosis of lumbar region 08/21/2017  . Degeneration of lumbar intervertebral disc 08/07/2017  . Lumbar radiculopathy 08/07/2017  . Hypotension due to drugs   . Labile blood pressure   . Benign prostatic hyperplasia   . Hemiparesis affecting left side as late effect of stroke ([place])   . Basal ganglia infarction ([place]) 06/07/2017  . 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 ([place])   . Gait disturbance, post-stroke   . Hyperlipidemia   . Acute ischemic stroke ([place]) - R basal ganglia s/p IV tPA 06/05/2017  . Prostate nodule 10/30/2016  . Essential hypertension 08/05/2016  . External hemorrhoids without complication [id number]  . Melanosis of colon 11/23/2015  . Diverticulosis of colon without hemorrhage 11/23/2015  . Internal hemorrhoids 11/23/2015  . Essential hypertension, benign 09/14/2014   Past Medical History:  Diagnosis Date  . Allergy   . BPH (benign prostatic hyperplasia)    followed by [facility]  . Hyperlipidemia   . Hypertension   . Low back pain radiating to left lower extremity    sees Dr. Tonita Cong  . Stroke [facility])    05/2017   Past Surgical History:  Procedure Laterality Date  . COLONOSCOPY    . ESOPHAGOGASTRODUODENOSCOPY    .  REZUM     No Known Allergies Prior to Admission medications   Medication Sig Start Date End Date Taking? Authorizing Provider  atorvastatin (LIPITOR) 20 MG tablet Take 1 tablet (20 mg total) by mouth daily at 6 PM. 08/15/19  Yes [name], MD  clopidogrel (PLAVIX) 75 MG tablet Take 1 tablet (75 mg total) by mouth daily. 08/15/19  Yes [name], MD  fluticasone (FLONASE) 50 MCG/ACT nasal spray Place 1 spray into both nostrils daily. 06/13/17  Yes Love, [name], PA-C  gabapentin (NEURONTIN) 300 MG capsule Take 1-2 capsules  (300-600 mg total) by mouth 2 (two) times daily as needed. 08/15/19  Yes [name], MD  lisinopril (ZESTRIL) 10 MG tablet Take 1 tablet (10 mg total) by mouth daily. 08/15/19  Yes [name], MD  loratadine (CLARITIN) 10 MG tablet Take 10 mg by mouth daily as needed for allergies.   Yes [provider]  polyethylene glycol (MIRALAX / GLYCOLAX) 17 g packet Take 17 g by mouth daily.   Yes [provider]  tamsulosin (FLOMAX) 0.4 MG CAPS capsule Take 1 capsule (0.4 mg total) by mouth daily. 08/15/19  Yes [name], MD  amantadine (SYMMETREL) 100 MG capsule TAKE 2 CAPSULES (200 MG TOTAL) BY MOUTH 2 (TWO) TIMES DAILY WITH BREAKFAST AND LUNCH FOR 30 DAYS. 11/11/18 08/15/19  Jamse Arn, MD   Social History   Socioeconomic History  . Marital status: Married    Spouse name: Not on file  . Number of children: 1  . Years of education: Not on file  . Highest education level: High school graduate  Occupational History  . Occupation: driver  Tobacco Use  . Smoking status: Never Smoker  . Smokeless tobacco: Never Used  Vaping Use  . Vaping Use: Never used  Substance and Sexual Activity  . Alcohol use: Yes    Comment: Once a month   . Drug use: No  . Sexual activity: Not on file  Other Topics Concern  . Not on file  Social History Narrative   Lives at home with his wife   Right handed   Caffeine: 1 cup of coffee in the mornings    Social Determinants of Health   Financial Resource Strain: Not on file  Food Insecurity: Not on file  Transportation Needs: Not on file  Physical Activity: Not on file  Stress: Not on file  Social Connections: Not on file  Intimate Partner Violence: Not on file    Review of Systems  Constitutional: Negative for fatigue, fever and unexpected weight change.  HENT: Positive for congestion, rhinorrhea and sneezing. Negative for trouble swallowing.   Eyes: Negative for visual disturbance.  Respiratory: Negative for  cough, chest tightness and shortness of breath.   Cardiovascular: Negative for chest pain, palpitations and leg swelling.  Gastrointestinal: Negative for abdominal pain and blood in stool.  Neurological: Negative for dizziness, light-headedness and headaches.     Objective:   Vitals:   02/16/20 0922  BP: 139/89  Pulse: 70  Resp: 16  Temp: 98.7 F (37.1 C)  TempSrc: Temporal  SpO2: 98%  Weight: 161 lb 6.4 oz (73.2 kg)  Height: 5\' 8"  (1.727 m)     Physical Exam Vitals reviewed.  Constitutional:      General: He is not in acute distress.    Appearance: Normal appearance. He is well-developed and well-nourished. He is not ill-appearing or toxic-appearing.     Comments: Exam with gown, gloves,  face shield, n95  HENT:     Head: Normocephalic and atraumatic.     Nose:     Comments: Sinuses nontender.  Eyes:     Extraocular Movements: EOM normal.     Pupils: Pupils are equal, round, and reactive to light.  Neck:     Vascular: No carotid bruit or JVD.  Cardiovascular:     Rate and Rhythm: Normal rate and regular rhythm.     Heart sounds: Normal heart sounds. No murmur heard.   Pulmonary:     Effort: Pulmonary effort is normal. No respiratory distress.     Breath sounds: Normal breath sounds. No wheezing, rhonchi or rales.  Musculoskeletal:        General: No edema.  Skin:    General: Skin is warm and dry.  Neurological:     Mental Status: He is alert and oriented to person, place, and time.  Psychiatric:        Mood and Affect: Mood and affect and mood normal.        Behavior: Behavior normal.    38 minutes spent during visit, greater than 50% counseling and assimilation of information, chart review, and discussion of plan.   Assessment & Plan:  Leonides Minder is a 60 y.o. male . Nasal congestion - Plan: SARS-CoV-2 RNA (COVID-19) and Respiratory Viral Panel, Qualitative NAAT Sneezing - Plan: SARS-CoV-2 RNA (COVID-19) and Respiratory Viral Panel, Qualitative  NAAT  Possible allergies versus viral illness/URI.  Less likely COVID-19 breakthrough infection, has been vaccinated and did receive booster.  Did recommend isolation and masking precautions until COVID-19 test returned.  Symptomatic care, continue Zyrtec with RTC precautions.  Hyperlipidemia, unspecified hyperlipidemia type - Plan: Comprehensive metabolic panel, Lipid panel  -Stable, continue same regimen.  Check labs  Essential hypertension - Plan: Comprehensive metabolic panel  -Borderline, goal blood pressure discussed.  Continue same dose lisinopril for now with RTC precautions.  Cerebrovascular accident (CVA) of basal ganglia ([place]) - Plan: Lipid panel  -Stable with Plavix, no new bleeding.  Symptoms stable with amantadine once per day, gabapentin twice daily.  Continue same.  BPH stable with tamsulosin, continue same.  No orders of the defined types were placed in this encounter.  Patient Instructions    Although your symptoms are mild and possibly cold virus or allergies, I do recommend isolation until your COVID-19 results have returned.  If you are unable to isolate make sure you wear a well fitting mask around others.  Okay to continue Zyrtec, fluids, rest, Tylenol if needed.  Let me know if any new or worsening symptoms.  Blood pressure goal is under 130/80.  If you continually have readings above that, let me know.  No medication changes today.  Thanks for coming in and let me know if there are questions.   Return to the clinic or go to the nearest emergency room if any of your symptoms worsen or new symptoms occur.    If you have lab work done today you will be contacted with your lab results within the next 2 weeks.  If you have not heard from [place] then please contact us. The fastest way to get your results is to register for My Chart.   IF you received an x-ray today, you will receive an invoice from [facility]. Please contact [facility] at [phone]  with questions or concerns regarding your invoice.   IF you received labwork today, you will receive an invoice from [place]. Please contact [facility] at [phone]  with questions or concerns regarding your invoice.   Our billing staff will not be able to assist you with questions regarding bills from these companies.  You will be contacted with the lab results as soon as they are available. The fastest way to get your results is to activate your My Chart account. Instructions are located on the last page of this paperwork. If you have not heard from [place] regarding the results in 2 weeks, please contact this office.         Signed, Merri Ray, MD [facility]

## 2020-02-16 NOTE — Patient Instructions (Addendum)
  Although your symptoms are mild and possibly cold virus or allergies, I do recommend isolation until your COVID-19 results have returned.  If you are unable to isolate make sure you wear a well fitting mask around others.  Okay to continue Zyrtec, fluids, rest, Tylenol if needed.  Let me know if any new or worsening symptoms.  Blood pressure goal is under 130/80.  If you continually have readings above that, let me know.  No medication changes today.  Thanks for coming in and let me know if there are questions.   Return to the clinic or go to the nearest emergency room if any of your symptoms worsen or new symptoms occur.    If you have lab work done today you will be contacted with your lab results within the next 2 weeks.  If you have not heard from [place] then please contact us. The fastest way to get your results is to register for My Chart.   IF you received an x-ray today, you will receive an invoice from [facility]. Please contact [facility] at [phone] with questions or concerns regarding your invoice.   IF you received labwork today, you will receive an invoice from [place]. Please contact [facility] at [phone] with questions or concerns regarding your invoice.   Our billing staff will not be able to assist you with questions regarding bills from these companies.  You will be contacted with the lab results as soon as they are available. The fastest way to get your results is to activate your My Chart account. Instructions are located on the last page of this paperwork. If you have not heard from [place] regarding the results in 2 weeks, please contact this office.

## 2020-02-17 LAB — COMPREHENSIVE METABOLIC PANEL
AG Ratio: 1.4 (calc) (ref 1.0–2.5)
ALT: 17 U/L (ref 9–46)
AST: 17 U/L (ref 10–35)
Albumin: 4.2 g/dL (ref 3.6–5.1)
Alkaline phosphatase (APISO): 100 U/L (ref 35–144)
BUN: 12 mg/dL (ref 7–25)
CO2: 25 mmol/L (ref 20–32)
Calcium: 9.3 mg/dL (ref 8.6–10.3)
Chloride: 109 mmol/L (ref 98–110)
Creat: 0.92 mg/dL (ref 0.70–1.25)
Globulin: 2.9 g/dL (calc) (ref 1.9–3.7)
Glucose, Bld: 94 mg/dL (ref 65–99)
Potassium: 4.2 mmol/L (ref 3.5–5.3)
Sodium: 143 mmol/L (ref 135–146)
Total Bilirubin: 1 mg/dL (ref 0.2–1.2)
Total Protein: 7.1 g/dL (ref 6.1–8.1)

## 2020-02-17 LAB — LIPID PANEL
Cholesterol: 128 mg/dL (ref <200)
HDL: 39 mg/dL — ABNORMAL LOW (ref >=40)
LDL Cholesterol (Calc): 71 mg/dL (calc)
Non-HDL Cholesterol (Calc): 89 mg/dL (calc) (ref <130)
Total CHOL/HDL Ratio: 3.3 (calc) (ref <5.0)
Triglycerides: 95 mg/dL (ref <150)

## 2020-02-19 LAB — SARS-COV-2 RNA (COVID-19) RESP VIRAL PNL QL NAAT

## 2020-02-19 LAB — TIQ-MISC

## 2020-02-23 ENCOUNTER — Telehealth: Payer: Self-pay

## 2020-02-23 NOTE — Telephone Encounter (Signed)
Called [facility] lab to verify why the SARS-CoV-2 RNA (COVID-19) and Respiratory Viral Panel, Qualitative NAAT  Order was canceled and it was due to improper specimen collection and transport method. Called pt to notify them of situation, pt states that he feels fine and would not like to come back in office for recollection of specimen.

## 2020-02-23 NOTE — Telephone Encounter (Signed)
Ok. Noted. Thanks.

## 2020-12-27 ENCOUNTER — Other Ambulatory Visit: Payer: Self-pay | Admitting: Family Medicine

## 2020-12-27 DIAGNOSIS — E785 Hyperlipidemia, unspecified: Secondary | ICD-10-CM

## 2021-03-14 ENCOUNTER — Other Ambulatory Visit: Payer: Self-pay

## 2021-03-14 ENCOUNTER — Emergency Department (INDEPENDENT_AMBULATORY_CARE_PROVIDER_SITE_OTHER)
Admission: EM | Admit: 2021-03-14 | Discharge: 2021-03-14 | Disposition: A | Discharge status: Home / Self Care | Payer: 59 | Source: Home / Self Care | Attending: Family Medicine | Admitting: Family Medicine

## 2021-03-14 DIAGNOSIS — J111 Influenza due to unidentified influenza virus with other respiratory manifestations: Secondary | ICD-10-CM

## 2021-03-14 LAB — POC INFLUENZA A AND B ANTIGEN (URGENT CARE ONLY)
Influenza A Ag: NEGATIVE
Influenza B Ag: NEGATIVE

## 2021-03-14 LAB — POC SARS CORONAVIRUS 2 AG -  ED: SARS Coronavirus 2 Ag: NEGATIVE

## 2021-03-14 MED ORDER — FLUTICASONE PROPIONATE 50 MCG/ACT NA SUSP: 1.0000 | Freq: Every day | NASAL | 0 refills | Status: DC

## 2021-03-14 MED ORDER — IBUPROFEN 600 MG PO TABS: 600.0000 mg | ORAL_TABLET | Freq: Four times a day (QID) | ORAL | 0 refills | Status: DC | PRN

## 2021-03-14 NOTE — ED Provider Notes (Signed)
[facility]    CSN: [id number] Arrival date & time: 03/14/21  0912      History   Chief Complaint Chief Complaint  Patient presents with   Fever   Nasal Congestion   Cough    HPI Robert Sandoval is a 61 y.o. male.   HPI Patient has nasal congestion, runny nose, headache, cough and fatigue.  He states his symptoms been going on for 2 to 3 days.  No known exposure to illness.  No one else at home is sick.  He is vaccinated for flu and COVID.  Past Medical History:  Diagnosis Date   Allergy    BPH (benign prostatic hyperplasia)    followed by [facility]   Hyperlipidemia    Hypertension    Low back pain radiating to left lower extremity    sees Dr. Tonita Cong   Stroke [facility])    05/2017    Patient Active Problem List   Diagnosis Date Noted   Other fatigue 01/03/2018   Alteration of sensation as late effect of stroke 09/07/2017   History of cerebrovascular accident 08/22/2017   Low back pain 08/22/2017   Spinal stenosis of lumbar region 08/21/2017   Degeneration of lumbar intervertebral disc 08/07/2017   Lumbar radiculopathy 08/07/2017   Hypotension due to drugs    Labile blood pressure    Benign prostatic hyperplasia    Hemiparesis affecting left side as late effect of stroke ([employer])    Basal ganglia infarction ([place]) 06/07/2017   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 [facility])    Gait disturbance, post-stroke    Hyperlipidemia    Acute ischemic stroke ([place]) - R basal ganglia s/p IV tPA 06/05/2017   Prostate nodule 10/30/2016   Essential hypertension 08/05/2016   External hemorrhoids without complication 82/42/3536   Melanosis of colon 11/23/2015   Diverticulosis of colon without hemorrhage 11/23/2015   Internal hemorrhoids 11/23/2015   Essential hypertension, benign 09/14/2014    Past Surgical History:  Procedure Laterality Date   COLONOSCOPY     ESOPHAGOGASTRODUODENOSCOPY     REZUM         Home  Medications    Prior to Admission medications   Medication Sig Start Date End Date Taking? Authorizing Provider  ibuprofen (ADVIL) 600 MG tablet Take 1 tablet (600 mg total) by mouth every 6 (six) hours as needed. 03/14/21  Yes Raylene Everts, MD  amantadine (SYMMETREL) 100 MG capsule TAKE 2 CAPSULES (200 MG TOTAL) BY MOUTH 2 (TWO) TIMES DAILY WITH BREAKFAST AND LUNCH FOR 30 DAYS. Patient not taking: Reported on 03/14/2021 11/11/18 08/15/19  Jamse Arn, MD  atorvastatin (LIPITOR) 20 MG tablet Take 1 tablet (20 mg total) by mouth daily at 6 PM. 02/16/20   [name], MD  clopidogrel (PLAVIX) 75 MG tablet Take 1 tablet (75 mg total) by mouth daily. 02/16/20   [name], MD  fluticasone (FLONASE) 50 MCG/ACT nasal spray Place 1 spray into both nostrils daily. 03/14/21   Raylene Everts, MD  gabapentin (NEURONTIN) 300 MG capsule Take 1 capsule (300 mg total) by mouth 2 (two) times daily. 02/16/20   [name], MD  lisinopril (ZESTRIL) 10 MG tablet Take 1 tablet (10 mg total) by mouth daily. 02/16/20   [name], MD  loratadine (CLARITIN) 10 MG tablet Take 10 mg by mouth daily as needed for allergies.    [provider]  polyethylene glycol (MIRALAX / GLYCOLAX) 17 g  packet Take 17 g by mouth daily.    [provider]  tamsulosin (FLOMAX) 0.4 MG CAPS capsule Take 1 capsule (0.4 mg total) by mouth daily. 02/16/20   [name], MD    Family History Family History  Problem Relation Age of Onset   Cirrhosis Mother        NASH   Other Mother        Delbert Phenix as a child   Hyperlipidemia Father    Asthma Father    Heart failure Father    Colon cancer Neg Hx    Esophageal cancer Neg Hx    Rectal cancer Neg Hx    Stomach cancer Neg Hx     Social History Social History   Tobacco Use   Smoking status: Never   Smokeless tobacco: Never  Vaping Use   Vaping Use: Never used  Substance Use Topics   Alcohol use: Yes    Comment: Once a month     Drug use: No     Allergies   Patient has no known allergies.   Review of Systems Review of Systems See HPI  Physical Exam Triage Vital Signs ED Triage Vitals  Enc Vitals Group     BP 03/14/21 0922 128/87     Pulse Rate 03/14/21 0922 97     Resp 03/14/21 0922 16     Temp 03/14/21 0922 98.5 F (36.9 C)     Temp Source 03/14/21 0922 Oral     SpO2 03/14/21 0922 96 %     Weight --      Height --      Head Circumference --      Peak Flow --      Pain Score 03/14/21 0923 5     Pain Loc --      Pain Edu? --      Excl. in [place]? --    No data found.  Updated Vital Signs BP 128/87 (BP Location: Left Arm)    Pulse 97    Temp 98.5 F (36.9 C) (Oral)    Resp 16    SpO2 96%      Physical Exam Constitutional:      General: He is not in acute distress.    Appearance: He is well-developed. He is ill-appearing.  HENT:     Head: Normocephalic and atraumatic.     Right Ear: Tympanic membrane and ear canal normal.     Left Ear: Tympanic membrane and ear canal normal.     Nose: Congestion and rhinorrhea present.     Mouth/Throat:     Pharynx: No posterior oropharyngeal erythema.     Comments: Clear rhinorrhea Eyes:     Conjunctiva/sclera: Conjunctivae normal.     Pupils: Pupils are equal, round, and reactive to light.  Cardiovascular:     Rate and Rhythm: Normal rate and regular rhythm.     Heart sounds: Normal heart sounds.  Pulmonary:     Effort: Pulmonary effort is normal. No respiratory distress.     Breath sounds: Normal breath sounds.  Abdominal:     General: There is no distension.     Palpations: Abdomen is soft.  Musculoskeletal:        General: Normal range of motion.     Cervical back: Normal range of motion.  Lymphadenopathy:     Cervical: No cervical adenopathy.  Skin:    General: Skin is warm and dry.  Neurological:     Mental Status: He is  alert.     UC Treatments / Results  Labs (all labs ordered are listed, but only abnormal results are  displayed) Labs Reviewed  POC SARS CORONAVIRUS 2 AG -  ED  POC INFLUENZA A AND B ANTIGEN (URGENT CARE ONLY)    EKG   Radiology No results found.  Procedures Procedures (including critical care time)  Medications Ordered in UC Medications - No data to display  Initial Impression / Assessment and Plan / UC Course  I have reviewed the triage vital signs and the nursing notes.  Pertinent labs & imaging results that were available during my care of the patient were reviewed by me and considered in my medical decision making (see chart for details).     Final Clinical Impressions(s) / UC Diagnoses   Final diagnoses:  Influenza-like illness     Discharge Instructions      Make sure you are drinking lots of water Use a humidifier if you have 1 Take ibuprofen 3 times a day with food as needed for body aches and pain Use Flonase to help with nasal congestion and drainage May return to work when symptoms improve   ED Prescriptions     Medication Sig Dispense Auth. Provider   fluticasone (FLONASE) 50 MCG/ACT nasal spray Place 1 spray into both nostrils daily. -- Raylene Everts, MD   ibuprofen (ADVIL) 600 MG tablet Take 1 tablet (600 mg total) by mouth every 6 (six) hours as needed. 30 tablet Raylene Everts, MD      PDMP not reviewed this encounter.   Raylene Everts, MD 03/14/21 [phone]

## 2021-03-14 NOTE — ED Triage Notes (Signed)
Pt presents with cough, congestion, and fever that began yesterday.

## 2021-03-14 NOTE — Discharge Instructions (Signed)
Make sure you are drinking lots of water Use a humidifier if you have 1 Take ibuprofen 3 times a day with food as needed for body aches and pain Use Flonase to help with nasal congestion and drainage May return to work when symptoms improve

## 2021-03-16 ENCOUNTER — Other Ambulatory Visit: Payer: Self-pay | Admitting: Family Medicine

## 2021-03-16 DIAGNOSIS — I10 Essential (primary) hypertension: Secondary | ICD-10-CM

## 2021-03-17 ENCOUNTER — Other Ambulatory Visit: Payer: Self-pay | Admitting: Family Medicine

## 2021-03-17 DIAGNOSIS — I6381 Other cerebral infarction due to occlusion or stenosis of small artery: Secondary | ICD-10-CM

## 2021-04-13 ENCOUNTER — Other Ambulatory Visit: Payer: Self-pay | Admitting: Family Medicine

## 2021-04-13 DIAGNOSIS — I6381 Other cerebral infarction due to occlusion or stenosis of small artery: Secondary | ICD-10-CM

## 2021-04-13 NOTE — Telephone Encounter (Signed)
Appointment pending in 2 days, refill of Plavix ordered ?

## 2021-04-13 NOTE — Telephone Encounter (Signed)
Patient is requesting a refill of the following medications: ?Requested Prescriptions  ? ?Pending Prescriptions Disp Refills  ? clopidogrel (PLAVIX) 75 MG tablet [Pharmacy Med Name: CLOPIDOGREL 75 MG TABLET] 90 tablet 3  ?  Sig: TAKE 1 TABLET BY MOUTH EVERY DAY  ? ? ?Date of patient request: 04/13/21 ?Last office visit: 02/16/20 ?Date of last refill: 02/16/20 ?Last refill amount: 90x3R ?

## 2021-04-15 ENCOUNTER — Ambulatory Visit (INDEPENDENT_AMBULATORY_CARE_PROVIDER_SITE_OTHER)
Admission: RE | Admit: 2021-04-15 | Discharge: 2021-04-15 | Disposition: A | Discharge status: Home / Self Care | Payer: 59 | Source: Ambulatory Visit | Attending: Family Medicine | Admitting: Family Medicine

## 2021-04-15 ENCOUNTER — Encounter: Payer: Self-pay | Admitting: Family Medicine

## 2021-04-15 ENCOUNTER — Ambulatory Visit (INDEPENDENT_AMBULATORY_CARE_PROVIDER_SITE_OTHER): Payer: 59 | Admitting: Family Medicine

## 2021-04-15 VITALS — BP 126/74 | HR 55 | Temp 98.2°F | Resp 16 | Ht 68.0 in | Wt 163.0 lb

## 2021-04-15 DIAGNOSIS — J22 Unspecified acute lower respiratory infection: Secondary | ICD-10-CM | POA: Diagnosis not present

## 2021-04-15 DIAGNOSIS — R052 Subacute cough: Secondary | ICD-10-CM

## 2021-04-15 DIAGNOSIS — R918 Other nonspecific abnormal finding of lung field: Secondary | ICD-10-CM

## 2021-04-15 DIAGNOSIS — R062 Wheezing: Secondary | ICD-10-CM

## 2021-04-15 MED ORDER — ALBUTEROL SULFATE HFA 108 (90 BASE) MCG/ACT IN AERS: 2.0000 | INHALATION_SPRAY | Freq: Four times a day (QID) | RESPIRATORY_TRACT | 0 refills | Status: AC | PRN

## 2021-04-15 MED ORDER — FLUTICASONE PROPIONATE HFA 44 MCG/ACT IN AERO: 1.0000 | INHALATION_SPRAY | Freq: Two times a day (BID) | RESPIRATORY_TRACT | 1 refills | Status: DC

## 2021-04-15 MED ORDER — AZITHROMYCIN 250 MG PO TABS: ORAL_TABLET | ORAL | 0 refills | Status: AC

## 2021-04-15 NOTE — Patient Instructions (Addendum)
Depending on chest xray, may need dedicated CT of chest or meeting with with pulmonary. I will let you know.  ?Start zpak for possible infection.  ?Albuterol if needed for wheezing, continue flonase and allegra.  If albuterol is needed daily, start flovent inhaler.  ?Recheck in 2 weeks.Return to the clinic or go to the nearest emergency room if any of your symptoms worsen or new symptoms occur. ? ?[facility] for xray: ?Walk in 8:30-4:30 during weekdays, no appointment needed ?[address]  ?[place], [place] [address] ? ? ?Cough, Adult ?Coughing is a reflex that clears your throat and your airways (respiratory system). Coughing helps to heal and protect your lungs. It is normal to cough occasionally, but a cough that happens with other symptoms or lasts a long time may be a sign of a condition that needs treatment. An acute cough may only last 2-3 weeks, while a chronic cough may last 8 or more weeks. ?Coughing is commonly caused by: ?Infection of the respiratory systemby viruses or bacteria. ?Breathing in substances that irritate your lungs. ?Allergies. ?Asthma. ?Mucus that runs down the back of your throat (postnasal drip). ?Smoking. ?Acid backing up from the stomach into the esophagus (gastroesophageal reflux). ?Certain medicines. ?Chronic lung problems. ?Other medical conditions such as heart failure or a blood clot in the lung (pulmonary embolism). ?Follow these instructions at home: ?Medicines ?Take over-the-counter and prescription medicines only as told by your health care provider. ?Talk with your health care provider before you take a cough suppressant medicine. ?Lifestyle ? ?Avoid cigarette smoke. Do not use any products that contain nicotine or tobacco, such as cigarettes, e-cigarettes, and chewing tobacco. If you need help quitting, ask your health care provider. ?Drink enough fluid to keep your urine pale yellow. ?Avoid caffeine. ?Do not drink alcohol if your health care provider tells you not to  drink. ?General instructions ? ?Pay close attention to changes in your cough. Tell your health care provider about them. ?Always cover your mouth when you cough. ?Avoid things that make you cough, such as perfume, candles, cleaning products, or campfire or tobacco smoke. ?If the air is dry, use a cool mist vaporizer or humidifier in your bedroom or your home to help loosen secretions. ?If your cough is worse at night, try to sleep in a semi-upright position. ?Rest as needed. ?Keep all follow-up visits as told by your health care provider. This is important. ?Contact a health care provider if you: ?Have new symptoms. ?Cough up pus. ?Have a cough that does not get better after 2-3 weeks or gets worse. ?Cannot control your cough with cough suppressant medicines and you are losing sleep. ?Have pain that gets worse or pain that is not helped with medicine. ?Have a fever. ?Have unexplained weight loss. ?Have night sweats. ?Get help right away if: ?You cough up blood. ?You have difficulty breathing. ?Your heartbeat is very fast. ?These symptoms may represent a serious problem that is an emergency. Do not wait to see if the symptoms will go away. Get medical help right away. Call your local emergency services (911 in the [place] Do not drive yourself to the hospital. ?Summary ?Coughing is a reflex that clears your throat and your airways. It is normal to cough occasionally, but a cough that happens with other symptoms or lasts a long time may be a sign of a condition that needs treatment. ?Take over-the-counter and prescription medicines only as told by your health care provider. ?Always cover your mouth when you cough. ?Contact  a health care provider if you have new symptoms or a cough that does not get better after 2-3 weeks or gets worse. ?This information is not intended to replace advice given to you by your health care provider. Make sure you discuss any questions you have with your health care provider. ?Document  Revised: 01/21/2018 Document Reviewed: 01/21/2018 ?Elsevier Patient Education ? 2022 [place]. ? ? ?

## 2021-04-15 NOTE — Progress Notes (Signed)
? ?Subjective:  ?Patient ID: Robert Sandoval, male    DOB: 15-Feb-1960  Age: 61 y.o. MRN: [id number] ? ?CC:  ?Chief Complaint  ?Patient presents with  ? Shortness of Breath  ?  Pt had gone to urgent care for SOB cough, had CT that shows lung infection, pt reports Urgent care did not treat him and he is here today to have this treated, pt has CT scan result in hand, went to urgent care 2/28  ? Health Maintenance  ?  Pt reports had flu shot at [facility] will request records, also reports had 1 dose Shingles vaccine unsure location will check [facility] pt may need 2nd dose or new series  ? ? ?HPI ?[name] presents for  ? ?Pulmonary infection: ?Seen in urgent care 03/14/2021.  Diagnosed with influenza-like illness.initially more productive cough - some improvement, but still persistent dry cough, occasional clear phlegm. Some dyspnea with activity, but some improvement.  ?No fever.  ?Allergies this time of year, taking flonase and allegra, helps.  ?Wheezing at times. No hx of copd/asthma or use of inhaler.  ? ?CT abdomen pelvis performed 03/30/2021 by his urologist due to history of gross hematuria.  Was noted to have nodular tree-in-bud opacities in the right greater than left lung base likely reflecting infectious versus inflammatory etiology. ? ? ?History ?Patient Active Problem List  ? Diagnosis Date Noted  ? Other fatigue 01/03/2018  ? Alteration of sensation as late effect of stroke 09/07/2017  ? History of cerebrovascular accident 08/22/2017  ? Low back pain 08/22/2017  ? Spinal stenosis of lumbar region 08/21/2017  ? Degeneration of lumbar intervertebral disc 08/07/2017  ? Lumbar radiculopathy 08/07/2017  ? Hypotension due to drugs   ? Labile blood pressure   ? Benign prostatic hyperplasia   ? Hemiparesis affecting left side as late effect of stroke ([place])   ? Basal ganglia infarction ([place]) 06/07/2017  ? 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 Gait disturbance, post-stroke   ?  Hyperlipidemia   ? Acute ischemic stroke ([place]) - R basal ganglia s/p IV tPA 06/05/2017  ? Prostate nodule 10/30/2016  ? Essential hypertension 08/05/2016  ? External hemorrhoids without complication 29/52/8413  ? Melanosis of colon 11/23/2015  ? Diverticulosis of colon without hemorrhage 11/23/2015  ? Internal hemorrhoids 11/23/2015  ? Essential hypertension, benign 09/14/2014  ? ?Past Medical History:  ?Diagnosis Date  ? Allergy   ? BPH (benign prostatic hyperplasia)   ? followed by [facility]  ? Hyperlipidemia   ? Hypertension   ? Low back pain radiating to left lower extremity   ? sees Dr. Tonita Cong  ? Stroke [facility])   ? 05/2017  ? ?Past Surgical History:  ?Procedure Laterality Date  ? COLONOSCOPY    ? ESOPHAGOGASTRODUODENOSCOPY    ? REZUM    ? ?No Known Allergies ?Prior to Admission medications   ?Medication Sig Start Date End Date Taking? Authorizing Provider  ?amantadine (SYMMETREL) 100 MG capsule TAKE 2 CAPSULES (200 MG TOTAL) BY MOUTH 2 (TWO) TIMES DAILY WITH BREAKFAST AND LUNCH FOR 30 DAYS. ?Patient not taking: Reported on 03/14/2021 11/11/18 08/15/19  Jamse Arn, MD  ?atorvastatin (LIPITOR) 20 MG tablet Take 1 tablet (20 mg total) by mouth daily at 6 PM. 02/16/20   [name], MD  ?clopidogrel (PLAVIX) 75 MG tablet TAKE 1 TABLET BY MOUTH EVERY DAY 04/13/21   [name], MD  ?fluticasone (FLONASE) 50 MCG/ACT nasal spray Place 1  spray into both nostrils daily. 03/14/21   Raylene Everts, MD  ?gabapentin (NEURONTIN) 300 MG capsule Take 1 capsule (300 mg total) by mouth 2 (two) times daily. 02/16/20   [name], MD  ?ibuprofen (ADVIL) 600 MG tablet Take 1 tablet (600 mg total) by mouth every 6 (six) hours as needed. 03/14/21   Raylene Everts, MD  ?lisinopril (ZESTRIL) 10 MG tablet Take 1 tablet (10 mg total) by mouth daily. 02/16/20   [name], MD  ?loratadine (CLARITIN) 10 MG tablet Take 10 mg by mouth daily as needed for allergies.    [provider]   ?polyethylene glycol (MIRALAX / GLYCOLAX) 17 g packet Take 17 g by mouth daily.    [provider]  ?tamsulosin (FLOMAX) 0.4 MG CAPS capsule Take 1 capsule (0.4 mg total) by mouth daily. 02/16/20   [name], MD  ? ?Social History  ? ?Socioeconomic History  ? Marital status: Married  ?  Spouse name: Not on file  ? Number of children: 1  ? Years of education: Not on file  ? Highest education level: High school graduate  ?Occupational History  ? Occupation: driver  ?Tobacco Use  ? Smoking status: Never  ? Smokeless tobacco: Never  ?Vaping Use  ? Vaping Use: Never used  ?Substance and Sexual Activity  ? Alcohol use: Yes  ?  Comment: Once a month   ? Drug use: No  ? Sexual activity: Not on file  ?Other Topics Concern  ? Not on file  ?Social History Narrative  ? Lives at home with his wife  ? Right handed  ? Caffeine: 1 cup of coffee in the mornings   ? ?Social Determinants of Health  ? ?Financial Resource Strain: Not on file  ?Food Insecurity: Not on file  ?Transportation Needs: Not on file  ?Physical Activity: Not on file  ?Stress: Not on file  ?Social Connections: Not on file  ?Intimate Partner Violence: Not on file  ? ? ?Review of Systems ? ?Per HPI;  ?Objective:  ? ?Vitals:  ? 04/15/21 0818  ?BP: 126/74  ?Pulse: (!) 55  ?Resp: 16  ?Temp: 98.2 ?F (36.8 ?C)  ?TempSrc: Temporal  ?SpO2: 96%  ?Weight: 163 lb (73.9 kg)  ?Height: '5\' 8"'$  (1.727 m)  ? ? ? ?Physical Exam ?Vitals reviewed.  ?Constitutional:   ?   Appearance: He is well-developed.  ?HENT:  ?   Head: Normocephalic and atraumatic.  ?   Right Ear: Tympanic membrane, ear canal and external ear normal.  ?   Left Ear: Tympanic membrane, ear canal and external ear normal.  ?   Nose: No rhinorrhea.  ?   Mouth/Throat:  ?   Pharynx: No oropharyngeal exudate or posterior oropharyngeal erythema.  ?Eyes:  ?   Conjunctiva/sclera: Conjunctivae normal.  ?   Pupils: Pupils are equal, round, and reactive to light.  ?Cardiovascular:  ?   Rate and Rhythm: Normal  rate and regular rhythm.  ?   Heart sounds: Normal heart sounds. No murmur heard. ?Pulmonary:  ?   Effort: Pulmonary effort is normal.  ?   Breath sounds: Normal breath sounds. No wheezing, rhonchi or rales.  ?Abdominal:  ?   Palpations: Abdomen is soft.  ?   Tenderness: There is no abdominal tenderness.  ?Musculoskeletal:  ?   Cervical back: Neck supple.  ?Lymphadenopathy:  ?   Cervical: No cervical adenopathy.  ?Skin: ?   General: Skin is warm and dry.  ?  Findings: No rash.  ?Neurological:  ?   Mental Status: He is alert and oriented to person, place, and time.  ?Psychiatric:     ?   Behavior: Behavior normal.  ? ? ? ? ? ?Assessment & Plan:  ?Esaw Knippel is a 61 y.o. male . ?Subacute cough - Plan: DG Chest 2 View, azithromycin (ZITHROMAX) 250 MG tablet ? ?LRTI (lower respiratory tract infection) - Plan: DG Chest 2 View, azithromycin (ZITHROMAX) 250 MG tablet ? ?Abnormal CT scan, lung - Plan: DG Chest 2 View, azithromycin (ZITHROMAX) 250 MG tablet ? ?Wheeze - Plan: albuterol (VENTOLIN HFA) 108 (90 Base) MCG/ACT inhaler, fluticasone (FLOVENT HFA) 44 MCG/ACT inhaler ? ?New onset cough, prolonged cough since last month.  Possible inflammation versus infection noted on lower lungs with abdominal/pelvic CT as above.  Although cough has improved still some wheezes, still some persistent cough with dyspnea on exertion.  Reassuring exam in office.  O2 sat stable, no dyspnea at rest.  Possible component of reactive airway although no prior COPD/asthma. ? -Start Z-Pak, check chest x-ray, then decide if dedicated CT chest needed versus pulmonary eval ? -Albuterol if needed for wheezing, Flovent if persistent need for albuterol.  Continue Flonase, Allegra for allergies.  Recheck 2 weeks with RTC/urgent care/ER precautions given. ? ?Meds ordered this encounter  ?Medications  ? albuterol (VENTOLIN HFA) 108 (90 Base) MCG/ACT inhaler  ?  Sig: Inhale 2 puffs into the lungs every 6 (six) hours as needed for wheezing or shortness  of breath.  ?  Dispense:  8 g  ?  Refill:  0  ? azithromycin (ZITHROMAX) 250 MG tablet  ?  Sig: Take 2 tablets on day 1, then 1 tablet daily on days 2 through 5  ?  Dispense:  6 tablet  ?  Refill:  0  ? fluticasone (F

## 2021-05-04 ENCOUNTER — Ambulatory Visit (INDEPENDENT_AMBULATORY_CARE_PROVIDER_SITE_OTHER): Payer: 59 | Admitting: Family Medicine

## 2021-05-04 ENCOUNTER — Encounter: Payer: Self-pay | Admitting: Family Medicine

## 2021-05-04 VITALS — BP 128/80 | HR 50 | Temp 98.1°F | Ht 68.0 in | Wt 160.6 lb

## 2021-05-04 DIAGNOSIS — R052 Subacute cough: Secondary | ICD-10-CM | POA: Diagnosis not present

## 2021-05-04 DIAGNOSIS — K649 Unspecified hemorrhoids: Secondary | ICD-10-CM

## 2021-05-04 DIAGNOSIS — Z23 Encounter for immunization: Secondary | ICD-10-CM

## 2021-05-04 NOTE — Progress Notes (Signed)
? ?Subjective:  ?Patient ID: Robert Sandoval, male    DOB: 1960-10-15  Age: 61 y.o. MRN: [id number] ? ?CC:  ?Chief Complaint  ?Patient presents with  ? Cough  ?  Pt reports cough and wheezing feeling have resolved.   ? Referral  ?  Pt notes had cologaurd and was positive, and is asking for referral I have requested pt send result through mychart for documentation purposes through work   ? Immunizations  ?  Pt agreed to shingrix dose 1 today   ? ? ?HPI ?Robert Sandoval presents for  ? ?Cough ?Subacute cough, discussed March 31.  Previous CT indicated possible inflammation versus infection.  Treated with Z-Pak, chest x-ray from last visit on 04/15/2021 was clear, normal exam.  Cough and wheezing has resolved. ?Feeling well.  ?Some allergies - treated with allegra, flonase.  ? ?Health maintenance: ? ?Shingrix first dose given today. ?Had positive fecal globin at work - 04/14/21. Advised to follow up with me. Hx of hemorrhoids, with BRBPR at times. Colonoscopy 11/2018 - repeat 7 years. Hemorrhoids noted at that time.  Home remedies has been helpful. Hard stools treated with miralax. Softer stools. ? ? ? ?History ?Patient Active Problem List  ? Diagnosis Date Noted  ? Other fatigue 01/03/2018  ? Alteration of sensation as late effect of stroke 09/07/2017  ? History of cerebrovascular accident 08/22/2017  ? Low back pain 08/22/2017  ? Spinal stenosis of lumbar region 08/21/2017  ? Degeneration of lumbar intervertebral disc 08/07/2017  ? Lumbar radiculopathy 08/07/2017  ? Hypotension due to drugs   ? Labile blood pressure   ? Benign prostatic hyperplasia   ? Hemiparesis affecting left side as late effect of stroke ([place])   ? Basal ganglia infarction ([place]) 06/07/2017  ? 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 [facility])   ? Gait disturbance, post-stroke   ? Hyperlipidemia   ? Acute ischemic stroke ([place]) - R basal ganglia s/p IV tPA 06/05/2017  ? Prostate nodule 10/30/2016  ? Essential hypertension  08/05/2016  ? External hemorrhoids without complication 15/72/6203  ? Melanosis of colon 11/23/2015  ? Diverticulosis of colon without hemorrhage 11/23/2015  ? Internal hemorrhoids 11/23/2015  ? Essential hypertension, benign 09/14/2014  ? ?Past Medical History:  ?Diagnosis Date  ? Allergy   ? BPH (benign prostatic hyperplasia)   ? followed by [facility]  ? Hyperlipidemia   ? Hypertension   ? Low back pain radiating to left lower extremity   ? sees Dr. Tonita Cong  ? Stroke [facility])   ? 05/2017  ? ?Past Surgical History:  ?Procedure Laterality Date  ? COLONOSCOPY    ? ESOPHAGOGASTRODUODENOSCOPY    ? REZUM    ? ?No Known Allergies ?Prior to Admission medications   ?Medication Sig Start Date End Date Taking? Authorizing Provider  ?albuterol (VENTOLIN HFA) 108 (90 Base) MCG/ACT inhaler Inhale 2 puffs into the lungs every 6 (six) hours as needed for wheezing or shortness of breath. 04/15/21  Yes [name], MD  ?atorvastatin (LIPITOR) 20 MG tablet Take 1 tablet (20 mg total) by mouth daily at 6 PM. 02/16/20  Yes [name], MD  ?clopidogrel (PLAVIX) 75 MG tablet TAKE 1 TABLET BY MOUTH EVERY DAY 04/13/21  Yes [name], MD  ?finasteride (PROSCAR) 5 MG tablet Take 5 mg by mouth daily. 03/22/21  Yes [provider]  ?fluticasone (FLONASE) 50 MCG/ACT nasal spray Place 1 spray into both nostrils daily. 03/14/21  Yes Blanchie Serve  Collie Siad, MD  ?fluticasone (FLOVENT HFA) 44 MCG/ACT inhaler Inhale 1-2 puffs into the lungs in the morning and at bedtime. 04/15/21  Yes [name], MD  ?gabapentin (NEURONTIN) 300 MG capsule Take 1 capsule (300 mg total) by mouth 2 (two) times daily. 02/16/20  Yes [name], MD  ?ibuprofen (ADVIL) 600 MG tablet Take 1 tablet (600 mg total) by mouth every 6 (six) hours as needed. 03/14/21  Yes Raylene Everts, MD  ?lisinopril (ZESTRIL) 10 MG tablet Take 1 tablet (10 mg total) by mouth daily. 02/16/20  Yes [name], MD  ?loratadine (CLARITIN) 10 MG tablet Take  10 mg by mouth daily as needed for allergies.   Yes [provider]  ?polyethylene glycol (MIRALAX / GLYCOLAX) 17 g packet Take 17 g by mouth daily. PRN   Yes [provider]  ?tamsulosin (FLOMAX) 0.4 MG CAPS capsule Take 1 capsule (0.4 mg total) by mouth daily. 02/16/20  Yes [name], MD  ? ?Social History  ? ?Socioeconomic History  ? Marital status: Married  ?  Spouse name: Not on file  ? Number of children: 1  ? Years of education: Not on file  ? Highest education level: High school graduate  ?Occupational History  ? Occupation: driver  ?Tobacco Use  ? Smoking status: Never  ? Smokeless tobacco: Never  ?Vaping Use  ? Vaping Use: Never used  ?Substance and Sexual Activity  ? Alcohol use: Yes  ?  Comment: Once a month   ? Drug use: No  ? Sexual activity: Not on file  ?Other Topics Concern  ? Not on file  ?Social History Narrative  ? Lives at home with his wife  ? Right handed  ? Caffeine: 1 cup of coffee in the mornings   ? ?Social Determinants of Health  ? ?Financial Resource Strain: Not on file  ?Food Insecurity: Not on file  ?Transportation Needs: Not on file  ?Physical Activity: Not on file  ?Stress: Not on file  ?Social Connections: Not on file  ?Intimate Partner Violence: Not on file  ? ? ?Review of Systems ? ? ?Objective:  ? ?Vitals:  ? 05/04/21 0944  ?BP: 128/80  ?Pulse: (!) 50  ?Temp: 98.1 ?F (36.7 ?C)  ?TempSrc: Temporal  ?SpO2: 99%  ?Weight: 160 lb 9.6 oz (72.8 kg)  ?Height: '5\' 8"'$  (1.727 m)  ? ? ? ?Physical Exam ?Vitals reviewed.  ?Constitutional:   ?   Appearance: He is well-developed.  ?HENT:  ?   Head: Normocephalic and atraumatic.  ?Neck:  ?   Vascular: No carotid bruit or JVD.  ?Cardiovascular:  ?   Rate and Rhythm: Normal rate and regular rhythm.  ?   Heart sounds: Normal heart sounds. No murmur heard. ?Pulmonary:  ?   Effort: Pulmonary effort is normal.  ?   Breath sounds: Normal breath sounds. No rales.  ?Abdominal:  ?   General: Abdomen is flat. There is no distension.   ?   Tenderness: There is no abdominal tenderness.  ?Musculoskeletal:  ?   Right lower leg: No edema.  ?   Left lower leg: No edema.  ?Skin: ?   General: Skin is warm and dry.  ?Neurological:  ?   Mental Status: He is alert and oriented to person, place, and time.  ?Psychiatric:     ?   Mood and Affect: Mood normal.  ? ? ? ? ? ?Assessment & Plan:  ?Frutoso Dimare is a 61 y.o. male . ?  Subacute cough ? -Resolved, chest x-ray reassuring previously.  RTC precautions if recurs.  Deferred further imaging at this time. ? ?Need for shingles vaccine - Plan: Varicella-zoster vaccine IM given.  Repeat 2 to 6 months. ? ?Hemorrhoids, unspecified hemorrhoid type ?Likely cause of positive occult blood testing at work.  Up-to-date on colonoscopy and known history of hemorrhoids.  Hemorrhoid treatment discussed, continue MiraLAX to manage hard stools.  RTC precautions, GI follow-up as needed for persistent hemorrhoids. ? ?No orders of the defined types were placed in this encounter. ? ?Patient Instructions  ?If worsening hemorrhoids, let me know and I can refer you to specialist.  ?If cough returns, let me know. Glad you are doing well.  ? ?Hemorrhoids ?Hemorrhoids are swollen veins in and around the rectum or anus. There are two types of hemorrhoids: ?Internal hemorrhoids. These occur in the veins that are just inside the rectum. They may poke through to the outside and become irritated and painful. ?External hemorrhoids. These occur in the veins that are outside the anus and can be felt as a painful swelling or hard lump near the anus. ?Most hemorrhoids do not cause serious problems, and they can be managed with home treatments such as diet and lifestyle changes. If home treatments do not help the symptoms, procedures can be done to shrink or remove the hemorrhoids. ?What are the causes? ?This condition is caused by increased pressure in the anal area. This pressure may result from various things, including: ?Constipation. ?Straining  to have a bowel movement. ?Diarrhea. ?Pregnancy. ?Obesity. ?Sitting for long periods of time. ?Heavy lifting or other activity that causes you to strain. ?Anal sex. ?Riding a bike for a long period of t

## 2021-05-04 NOTE — Patient Instructions (Addendum)
If worsening hemorrhoids, let me know and I can refer you to specialist.  ?If cough returns, let me know. Glad you are doing well.  ? ?Hemorrhoids ?Hemorrhoids are swollen veins in and around the rectum or anus. There are two types of hemorrhoids: ?Internal hemorrhoids. These occur in the veins that are just inside the rectum. They may poke through to the outside and become irritated and painful. ?External hemorrhoids. These occur in the veins that are outside the anus and can be felt as a painful swelling or hard lump near the anus. ?Most hemorrhoids do not cause serious problems, and they can be managed with home treatments such as diet and lifestyle changes. If home treatments do not help the symptoms, procedures can be done to shrink or remove the hemorrhoids. ?What are the causes? ?This condition is caused by increased pressure in the anal area. This pressure may result from various things, including: ?Constipation. ?Straining to have a bowel movement. ?Diarrhea. ?Pregnancy. ?Obesity. ?Sitting for long periods of time. ?Heavy lifting or other activity that causes you to strain. ?Anal sex. ?Riding a bike for a long period of time. ?What are the signs or symptoms? ?Symptoms of this condition include: ?Pain. ?Anal itching or irritation. ?Rectal bleeding. ?Leakage of stool (feces). ?Anal swelling. ?One or more lumps around the anus. ?How is this diagnosed? ?This condition can often be diagnosed through a visual exam. Other exams or tests may also be done, such as: ?An exam that involves feeling the rectal area with a gloved hand (digital rectal exam). ?An exam of the anal canal that is done using a small tube (anoscope). ?A blood test, if you have lost a significant amount of blood. ?A test to look inside the colon using a flexible tube with a camera on the end (sigmoidoscopy or colonoscopy). ?How is this treated? ?This condition can usually be treated at home. However, various procedures may be done if dietary  changes, lifestyle changes, and other home treatments do not help your symptoms. These procedures can help make the hemorrhoids smaller or remove them completely. Some of these procedures involve surgery, and others do not. Common procedures include: ?Rubber band ligation. Rubber bands are placed at the base of the hemorrhoids to cut off their blood supply. ?Sclerotherapy. Medicine is injected into the hemorrhoids to shrink them. ?Infrared coagulation. A type of light energy is used to get rid of the hemorrhoids. ?Hemorrhoidectomy surgery. The hemorrhoids are surgically removed, and the veins that supply them are tied off. ?Stapled hemorrhoidopexy surgery. The surgeon staples the base of the hemorrhoid to the rectal wall. ?Follow these instructions at home: ?Eating and drinking ? ?Eat foods that have a lot of fiber in them, such as whole grains, beans, nuts, fruits, and vegetables. ?Ask your health care provider about taking products that have added fiber (fiber supplements). ?Reduce the amount of fat in your diet. You can do this by eating low-fat dairy products, eating less red meat, and avoiding processed foods. ?Drink enough fluid to keep your urine pale yellow. ?Managing pain and swelling ? ?Take warm sitz baths for 20 minutes, 3-4 times a day to ease pain and discomfort. You may do this in a bathtub or using a portable sitz bath that fits over the toilet. ?If directed, apply ice to the affected area. Using ice packs between sitz baths may be helpful. ?Put ice in a plastic bag. ?Place a towel between your skin and the bag. ?Leave the ice on for 20 minutes, 2-3 times  a day. ?General instructions ?Take over-the-counter and prescription medicines only as told by your health care provider. ?Use medicated creams or suppositories as told. ?Get regular exercise. Ask your health care provider how much and what kind of exercise is best for you. In general, you should do moderate exercise for at least 30 minutes on most  days of the week (150 minutes each week). This can include activities such as walking, biking, or yoga. ?Go to the bathroom when you have the urge to have a bowel movement. Do not wait. ?Avoid straining to have bowel movements. ?Keep the anal area dry and clean. Use wet toilet paper or moist towelettes after a bowel movement. ?Do not sit on the toilet for long periods of time. This increases blood pooling and pain. ?Keep all follow-up visits as told by your health care provider. This is important. ?Contact a health care provider if you have: ?Increasing pain and swelling that are not controlled by treatment or medicine. ?Difficulty having a bowel movement, or you are unable to have a bowel movement. ?Pain or inflammation outside the area of the hemorrhoids. ?Get help right away if you have: ?Uncontrolled bleeding from your rectum. ?Summary ?Hemorrhoids are swollen veins in and around the rectum or anus. ?Most hemorrhoids can be managed with home treatments such as diet and lifestyle changes. ?Taking warm sitz baths can help ease pain and discomfort. ?In severe cases, procedures or surgery can be done to shrink or remove the hemorrhoids. ?This information is not intended to replace advice given to you by your health care provider. Make sure you discuss any questions you have with your health care provider. ?Document Revised: 07/14/2020 Document Reviewed: 07/14/2020 ?Elsevier Patient Education ? [place]. ? ?

## 2021-05-20 ENCOUNTER — Ambulatory Visit (INDEPENDENT_AMBULATORY_CARE_PROVIDER_SITE_OTHER): Payer: 59 | Admitting: Family Medicine

## 2021-05-20 ENCOUNTER — Encounter: Payer: Self-pay | Admitting: Family Medicine

## 2021-05-20 VITALS — BP 126/85 | HR 65 | Wt 161.0 lb

## 2021-05-20 DIAGNOSIS — I69354 Hemiplegia and hemiparesis following cerebral infarction affecting left non-dominant side: Secondary | ICD-10-CM | POA: Diagnosis not present

## 2021-05-20 DIAGNOSIS — I10 Essential (primary) hypertension: Secondary | ICD-10-CM

## 2021-05-20 DIAGNOSIS — E785 Hyperlipidemia, unspecified: Secondary | ICD-10-CM

## 2021-05-20 DIAGNOSIS — N400 Enlarged prostate without lower urinary tract symptoms: Secondary | ICD-10-CM

## 2021-05-20 NOTE — Patient Instructions (Signed)
Great to meet you today! ?Continue current medications.  Let me know when you need refills.  ?See me again in 6 months.  ?

## 2021-05-20 NOTE — Progress Notes (Signed)
?Robert Sandoval - 61 y.o. male MRN [id number]  Date of birth: 01/05/1961 ? ?Subjective ?Chief Complaint  ?Patient presents with  ? Establish Care  ? ? ?HPI ?Robert Sandoval is a 61 y.o. male here today for initial visit to establish care.  He has a history of HTN, prior CVA, BPH and Asthama.  Rpeorts that he is doing well at this time.  No new concerns at this time.  He remains on plavix due to history of CVA.  He is tolerating atorvastatin well for HLD.   ? ?BP has remained well controlled with lisinopril  at '10mg'$ .  He denies chest pain, shortness of breath. palpitations, headache or vision changes.   ? ?BPH symptoms are well controlled with combination of flomax and proscar.   ? ?ROS:  A comprehensive ROS was completed and negative except as noted per HPI ? ?No Known Allergies ? ?Past Medical History:  ?Diagnosis Date  ? Allergy   ? BPH (benign prostatic hyperplasia)   ? followed by [facility]  ? Hyperlipidemia   ? Hypertension   ? Low back pain radiating to left lower extremity   ? sees Dr. Tonita Cong  ? Stroke [facility])   ? 05/2017  ? ? ?Past Surgical History:  ?Procedure Laterality Date  ? COLONOSCOPY    ? ESOPHAGOGASTRODUODENOSCOPY    ? REZUM    ? ? ?Social History  ? ?Socioeconomic History  ? Marital status: Married  ?  Spouse name: Not on file  ? Number of children: 1  ? Years of education: Not on file  ? Highest education level: High school graduate  ?Occupational History  ? Occupation: driver  ?Tobacco Use  ? Smoking status: Never  ? Smokeless tobacco: Never  ?Vaping Use  ? Vaping Use: Never used  ?Substance and Sexual Activity  ? Alcohol use: Yes  ?  Comment: Once a month   ? Drug use: No  ? Sexual activity: Yes  ?  Partners: Female  ?  Birth control/protection: Surgical  ?  Comment: vasectomy  ?Other Topics Concern  ? Not on file  ?Social History Narrative  ? Lives at home with his wife  ? Right handed  ? Caffeine: 1 cup of coffee in the mornings   ? ?Social Determinants of Health  ? ?Financial Resource Strain:  Not on file  ?Food Insecurity: Not on file  ?Transportation Needs: Not on file  ?Physical Activity: Not on file  ?Stress: Not on file  ?Social Connections: Not on file  ? ? ?Family History  ?Problem Relation Age of Onset  ? Cirrhosis Mother   ?     NASH  ? Other Mother   ?     Delbert Phenix as a child  ? Hyperlipidemia Father   ? Asthma Father   ? Heart failure Father   ? Colon cancer Neg Hx   ? Esophageal cancer Neg Hx   ? Rectal cancer Neg Hx   ? Stomach cancer Neg Hx   ? ? ?Health Maintenance  ?Topic Date Due  ? COVID-19 Vaccine (4 - Booster) 05/20/2021 (Originally 01/25/2020)  ? Zoster Vaccines- Shingrix (2 of 2) 06/29/2021  ? INFLUENZA VACCINE  08/16/2021  ? COLONOSCOPY (Pts 45-24yr Insurance coverage will need to be confirmed)  12/05/2025  ? TETANUS/TDAP  08/14/2028  ? Hepatitis C Screening  Completed  ? HIV Screening  Completed  ? HPV VACCINES  Aged Out  ? ? ? ?----------------------------------------------------------------------------------------------------------------------------------------------------------------------------------------------------------------- ?Physical Exam ?BP 126/85   Pulse 65  Wt 161 lb (73 kg)   SpO2 100%   BMI 24.48 kg/m?  ? ?Physical Exam ?Constitutional:   ?   Appearance: Normal appearance.  ?Eyes:  ?   General: No scleral icterus. ?Cardiovascular:  ?   Rate and Rhythm: Normal rate and regular rhythm.  ?Pulmonary:  ?   Effort: Pulmonary effort is normal.  ?   Breath sounds: Normal breath sounds.  ?Musculoskeletal:  ?   Cervical back: Neck supple.  ?Neurological:  ?   General: No focal deficit present.  ?   Mental Status: He is alert.  ?Psychiatric:     ?   Mood and Affect: Mood normal.     ?   Behavior: Behavior normal.  ? ? ?------------------------------------------------------------------------------------------------------------------------------------------------------------------------------------------------------------------- ?Assessment and Plan ? ?Hemiparesis affecting  left side as late effect of stroke ([place]) ?Mild residual deficits.  Recommend continuation of plavix and atorvastatin.  BP remains well controlled.  ? ?Benign prostatic hyperplasia ?BPH is well controlled with current medications.  Recommend continuation and updating psa today.  ? ?Hyperlipidemia ?Updating lipid panel.  ? ?Essential hypertension ?BP is well controlled at this time with lisinopril.   ? ? ?No orders of the defined types were placed in this encounter. ? ? ?Return in about 6 months (around 11/20/2021) for HTN/HLD. ? ? ? ?This visit occurred during the SARS-CoV-2 public health emergency.  Safety protocols were in place, including screening questions prior to the visit, additional usage of staff PPE, and extensive cleaning of exam room while observing appropriate contact time as indicated for disinfecting solutions.  ? ?

## 2021-05-21 LAB — COMPLETE METABOLIC PANEL WITH GFR
AG Ratio: 1.5 (calc) (ref 1.0–2.5)
ALT: 14 U/L (ref 9–46)
AST: 16 U/L (ref 10–35)
Albumin: 4 g/dL (ref 3.6–5.1)
Alkaline phosphatase (APISO): 91 U/L (ref 35–144)
BUN: 9 mg/dL (ref 7–25)
CO2: 23 mmol/L (ref 20–32)
Calcium: 9.2 mg/dL (ref 8.6–10.3)
Chloride: 108 mmol/L (ref 98–110)
Creat: 0.82 mg/dL (ref 0.70–1.35)
Globulin: 2.6 g/dL (calc) (ref 1.9–3.7)
Glucose, Bld: 90 mg/dL (ref 65–99)
Potassium: 3.9 mmol/L (ref 3.5–5.3)
Sodium: 139 mmol/L (ref 135–146)
Total Bilirubin: 1.2 mg/dL (ref 0.2–1.2)
Total Protein: 6.6 g/dL (ref 6.1–8.1)
eGFR: 100 mL/min/{1.73_m2} (ref >=60)

## 2021-05-21 LAB — CBC WITH DIFFERENTIAL/PLATELET
Absolute Monocytes: 353 cells/uL (ref 200–950)
Basophils Absolute: 50 cells/uL (ref 0–200)
Basophils Relative: 1.2 %
Eosinophils Absolute: 59 cells/uL (ref 15–500)
Eosinophils Relative: 1.4 %
HCT: 42.9 % (ref 38.5–50.0)
Hemoglobin: 14.2 g/dL (ref 13.2–17.1)
Lymphs Abs: 1541 cells/uL (ref 850–3900)
MCH: 29.5 pg (ref 27.0–33.0)
MCHC: 33.1 g/dL (ref 32.0–36.0)
MCV: 89 fL (ref 80.0–100.0)
MPV: 9.7 fL (ref 7.5–12.5)
Monocytes Relative: 8.4 %
Neutro Abs: 2197 cells/uL (ref 1500–7800)
Neutrophils Relative %: 52.3 %
Platelets: 248 10*3/uL (ref 140–400)
RBC: 4.82 10*6/uL (ref 4.20–5.80)
RDW: 12.9 % (ref 11.0–15.0)
Total Lymphocyte: 36.7 %
WBC: 4.2 10*3/uL (ref 3.8–10.8)

## 2021-05-21 LAB — LIPID PANEL W/REFLEX DIRECT LDL
Cholesterol: 124 mg/dL (ref <200)
HDL: 32 mg/dL — ABNORMAL LOW (ref >=40)
LDL Cholesterol (Calc): 72 mg/dL (calc)
Non-HDL Cholesterol (Calc): 92 mg/dL (calc) (ref <130)
Total CHOL/HDL Ratio: 3.9 (calc) (ref <5.0)
Triglycerides: 118 mg/dL (ref <150)

## 2021-05-21 LAB — TSH: TSH: 2.98 mIU/L (ref 0.40–4.50)

## 2021-05-21 LAB — PSA: PSA: 1.78 ng/mL (ref <=4.00)

## 2021-05-22 NOTE — Assessment & Plan Note (Signed)
Mild residual deficits.  Recommend continuation of plavix and atorvastatin.  BP remains well controlled.  ?

## 2021-05-22 NOTE — Assessment & Plan Note (Signed)
BPH is well controlled with current medications.  Recommend continuation and updating psa today.  ?

## 2021-05-22 NOTE — Assessment & Plan Note (Signed)
BP is well controlled at this time with lisinopril.   ?

## 2021-05-22 NOTE — Assessment & Plan Note (Signed)
Updating lipid panel. 

## 2021-06-08 ENCOUNTER — Other Ambulatory Visit: Payer: Self-pay | Admitting: Family Medicine

## 2021-06-08 DIAGNOSIS — I10 Essential (primary) hypertension: Secondary | ICD-10-CM

## 2021-07-22 ENCOUNTER — Other Ambulatory Visit: Payer: Self-pay

## 2021-07-22 DIAGNOSIS — E785 Hyperlipidemia, unspecified: Secondary | ICD-10-CM

## 2021-07-22 MED ORDER — ATORVASTATIN CALCIUM 20 MG PO TABS: 20.0000 mg | ORAL_TABLET | Freq: Every day | ORAL | 2 refills | Status: DC

## 2021-08-05 ENCOUNTER — Encounter: Payer: Self-pay | Admitting: Family Medicine

## 2021-08-05 ENCOUNTER — Ambulatory Visit (INDEPENDENT_AMBULATORY_CARE_PROVIDER_SITE_OTHER): Payer: 59 | Admitting: Family Medicine

## 2021-08-05 DIAGNOSIS — W57XXXA Bitten or stung by nonvenomous insect and other nonvenomous arthropods, initial encounter: Secondary | ICD-10-CM

## 2021-08-05 DIAGNOSIS — S00262A Insect bite (nonvenomous) of left eyelid and periocular area, initial encounter: Secondary | ICD-10-CM

## 2021-08-05 NOTE — Progress Notes (Signed)
Robert Sandoval - 61 y.o. male MRN [id number]  Date of birth: Feb 18, 1960  Subjective Chief Complaint  Patient presents with   Insect Bite    HPI Robert Sandoval is a 61 y.o. male here today with complaint of possible insect bite of the left lower eyelid.  Symptoms started about 7-8 days ago.  He has had swelling of the lower lid with some itching.  He has been using OTC hydrocortisone cream and has had improvement with this.   ROS:  A comprehensive ROS was completed and negative except as noted per HPI  No Known Allergies  Past Medical History:  Diagnosis Date   Allergy    BPH (benign prostatic hyperplasia)    followed by [facility]   Hyperlipidemia    Hypertension    Low back pain radiating to left lower extremity    sees Dr. Tonita Cong   Stroke [facility])    05/2017    Past Surgical History:  Procedure Laterality Date   COLONOSCOPY     ESOPHAGOGASTRODUODENOSCOPY     REZUM      Social History   Socioeconomic History   Marital status: Married    Spouse name: Not on file   Number of children: 1   Years of education: Not on file   Highest education level: High school graduate  Occupational History   Occupation: driver  Tobacco Use   Smoking status: Never   Smokeless tobacco: Never  Vaping Use   Vaping Use: Never used  Substance and Sexual Activity   Alcohol use: Yes    Comment: Once a month    Drug use: No   Sexual activity: Yes    Partners: Female    Birth control/protection: Surgical    Comment: vasectomy  Other Topics Concern   Not on file  Social History Narrative   Lives at home with his wife   Right handed   Caffeine: 1 cup of coffee in the mornings    Social Determinants of Health   Financial Resource Strain: Not on file  Food Insecurity: Not on file  Transportation Needs: Not on file  Physical Activity: Not on file  Stress: Not on file  Social Connections: Not on file    Family History  Problem Relation Age of Onset   Cirrhosis Mother         NASH   Other Mother        Delbert Phenix as a child   Hyperlipidemia Father    Asthma Father    Heart failure Father    Colon cancer Neg Hx    Esophageal cancer Neg Hx    Rectal cancer Neg Hx    Stomach cancer Neg Hx     Health Maintenance  Topic Date Due   COVID-19 Vaccine (4 - Pfizer series) 01/25/2020   Zoster Vaccines- Shingrix (2 of 2) 06/29/2021   INFLUENZA VACCINE  08/16/2021   COLONOSCOPY (Pts 45-15yr Insurance coverage will need to be confirmed)  12/05/2025   TETANUS/TDAP  08/14/2028   Hepatitis C Screening  Completed   HIV Screening  Completed   HPV VACCINES  Aged Out     ----------------------------------------------------------------------------------------------------------------------------------------------------------------------------------------------------------------- Physical Exam BP 129/82   Pulse (!) 57   Temp 97.8 F (36.6 C) (Oral)   SpO2 100%   Physical Exam Constitutional:      Appearance: Normal appearance.  Eyes:     Comments: Mild swelling and erythema of the lower lid.  No tenderness.   Neurological:     Mental Status:  He is alert.     ------------------------------------------------------------------------------------------------------------------------------------------------------------------------------------------------------------------- Assessment and Plan  Insect bite Appears to be resolving at this point.  He may continue hydrocortisone topically as needed.  He may also [place] benadryl if needed.  Discussed signs of infection and recommend contacting clinic if worsening.     No orders of the defined types were placed in this encounter.   No follow-ups on file.    This visit occurred during the SARS-CoV-2 public health emergency.  Safety protocols were in place, including screening questions prior to the visit, additional usage of staff PPE, and extensive cleaning of exam room while observing appropriate contact time as indicated  for disinfecting solutions.

## 2021-08-05 NOTE — Assessment & Plan Note (Signed)
Appears to be resolving at this point.  He may continue hydrocortisone topically as needed.  He may also [place] benadryl if needed.  Discussed signs of infection and recommend contacting clinic if worsening.

## 2021-08-05 NOTE — Patient Instructions (Signed)
You may continue hydrocortisone 1% twice daily.   You may use benadryl as needed for itching.   If having pain or increased swelling please let me know.    Insect Bite, Adult An insect bite can make your skin red, itchy, and swollen. Some insects can spread disease to people with a bite. However, most insect bites do not lead to disease, and most are not serious. What are the causes? Insects may bite for many reasons, including: Hunger. To defend themselves. Insects that bite include: Spiders. Mosquitoes. Ticks. Fleas. Ants. Flies. Kissing bugs. Chiggers. What are the signs or symptoms? Symptoms of this condition include: Itching or pain in the bite area. Redness and swelling in the bite area. An open wound (skin ulcer). Symptoms often last for 2-4 days. In rare cases, a person may have a very bad allergic reaction (anaphylactic reaction) to a bite. Symptoms of an anaphylactic reaction may include: Feeling warm in the face (flushed). Your face may turn red. Itchy, red, swollen areas of skin (hives). Swelling of the: Eyes. Lips. Face. Mouth. Tongue. Throat. Trouble with any of these: Breathing. Talking. Swallowing. Loud breathing (wheezing). Feeling dizzy or light-headed. Passing out (fainting). Pain or cramps in your belly. Throwing up (vomiting). Watery poop (diarrhea). How is this treated? Treatment is usually not needed. Symptoms often go away on their own. When treatment is needed, it may involve: Putting a cream or lotion on the bite area. This helps with itching. Taking an antibiotic medicine. This treatment is needed if the bite area gets infected. Getting a tetanus shot, if you are not up to date on this vaccine. Putting ice on the affected area. Using medicines called antihistamines. This treatment may be needed if you have itching or an allergic reaction to the insect bite. Giving yourself a shot of medicine (epinephrine) using an auto-injector "pen" if  you have an anaphylactic reaction to a bite. Your doctor will teach you how to use this pen. Follow these instructions at home: Bite area care  Do not scratch the bite area. Keep the bite area clean and dry. Wash the bite area every day with soap and water as told by your doctor. Check the bite area every day for signs of infection. Check for: Redness, swelling, or pain. Fluid or blood. Warmth. Pus or a bad smell. Managing pain, itching, and swelling  You may put any of these on the bite area as told by your doctor: A paste made of baking soda and water. Cortisone cream. Calamine lotion. If told, put ice on the bite area. Put ice in a plastic bag. Place a towel between your skin and the bag. Leave the ice on for 20 minutes, 2-3 times a day. General instructions Apply or take over-the-counter and prescription medicines only as told by your doctor. If you were prescribed an antibiotic medicine, take or apply it as told by your doctor. Do not stop using the antibiotic even if your condition improves. Keep all follow-up visits as told by your doctor. This is important. How is this prevented? To help you have a lower risk of insect bites: When you are outside, wear clothing that covers your arms and legs. Use insect repellent. The best insect repellents contain one of these: DEET. Picaridin. Oil of lemon eucalyptus (OLE). [id number]. Consider spraying your clothing with a pesticide called permethrin. Permethrin helps prevent insect bites. It works for several weeks and for up to 5-6 clothing washes. Do not apply permethrin directly to the skin. If  your home windows do not have screens, think about putting some in. If you will be sleeping in an area where there are mosquitoes, consider covering your sleeping area with a mosquito net. Contact a doctor if: You have redness, swelling, or pain in the bite area. You have fluid or blood coming from the bite area. The bite area feels warm to  the touch. You have pus or a bad smell coming from the bite area. You have a fever. Get help right away if: You have joint pain. You have a rash. You feel more tired or sleepy than you normally do. You have neck pain. You have a headache. You feel weaker than you normally do. You have signs of an anaphylactic reaction. Signs may include: Feeling warm in the face. Itchy, red, swollen areas of skin. Swelling of your: Eyes. Lips. Face. Mouth. Tongue. Throat. Trouble with any of these: Breathing. Talking. Swallowing. Loud breathing. Feeling dizzy or light-headed. Passing out. Pain or cramps in your belly. Throwing up. Watery poop. These symptoms may be an emergency. Do not wait to see if the symptoms will go away. Do this right away: Use your auto-injector pen as you have been told. Get medical help. Call your local emergency services (911 in the [place] Do not drive yourself to the hospital. Summary An insect bite can make your skin red, itchy, and swollen. Treatment is usually not needed. Symptoms often go away on their own. Do not scratch the bite area. Keep it clean and dry. Ice can help with pain and itching from the bite. This information is not intended to replace advice given to you by your health care provider. Make sure you discuss any questions you have with your health care provider. Document Revised: 10/04/2020 Document Reviewed: 10/04/2020 Elsevier Patient Education  [employer].

## 2021-11-21 ENCOUNTER — Ambulatory Visit: Payer: 59 | Admitting: Family Medicine

## 2021-11-29 ENCOUNTER — Ambulatory Visit (INDEPENDENT_AMBULATORY_CARE_PROVIDER_SITE_OTHER): Payer: 59 | Admitting: Family Medicine

## 2021-11-29 ENCOUNTER — Encounter: Payer: Self-pay | Admitting: Family Medicine

## 2021-11-29 VITALS — BP 114/74 | HR 67 | Ht 68.0 in | Wt 164.1 lb

## 2021-11-29 DIAGNOSIS — I69354 Hemiplegia and hemiparesis following cerebral infarction affecting left non-dominant side: Secondary | ICD-10-CM | POA: Diagnosis not present

## 2021-11-29 DIAGNOSIS — I10 Essential (primary) hypertension: Secondary | ICD-10-CM

## 2021-11-29 DIAGNOSIS — H01119 Allergic dermatitis of unspecified eye, unspecified eyelid: Secondary | ICD-10-CM

## 2021-11-29 DIAGNOSIS — N401 Enlarged prostate with lower urinary tract symptoms: Secondary | ICD-10-CM

## 2021-11-29 DIAGNOSIS — Z23 Encounter for immunization: Secondary | ICD-10-CM

## 2021-11-29 DIAGNOSIS — R351 Nocturia: Secondary | ICD-10-CM

## 2021-11-29 MED ORDER — TAMSULOSIN HCL 0.4 MG PO CAPS: 0.4000 mg | ORAL_CAPSULE | Freq: Every day | ORAL | 1 refills | Status: DC

## 2021-11-29 MED ORDER — LISINOPRIL 10 MG PO TABS: 10.0000 mg | ORAL_TABLET | Freq: Every day | ORAL | 2 refills | Status: DC

## 2021-11-29 NOTE — Progress Notes (Signed)
Robert Sandoval - 61 y.o. male MRN [id number]  Date of birth: 1960-02-09  Subjective Chief Complaint  Patient presents with   Hypertension    HPI Robert Sandoval is a 61 y.o. male here today for follow up visit.   He reports that he is doing well at this time.  History of HTN and previous CVA.  Mild L sided deficit form previous stroke.   BP remains well controlled with lisinopril at this time.  He does remain on plavix and atorvastatin.  He denies side effects from medications.    He is seeing urology for BPH.  Stable symptoms.  Remains on proscar and flomax.   He is still having irritation around the left eye.  He used some ketoconazole cream that his wife had.  This dried the area out.  Still has some itching.   ROS:  A comprehensive ROS was completed and negative except as noted per HPI  No Known Allergies  Past Medical History:  Diagnosis Date   Allergy    BPH (benign prostatic hyperplasia)    followed by [facility]   Hyperlipidemia    Hypertension    Low back pain radiating to left lower extremity    sees Dr. Tonita Cong   Stroke [facility])    05/2017    Past Surgical History:  Procedure Laterality Date   COLONOSCOPY     ESOPHAGOGASTRODUODENOSCOPY     REZUM      Social History   Socioeconomic History   Marital status: Married    Spouse name: Not on file   Number of children: 1   Years of education: Not on file   Highest education level: High school graduate  Occupational History   Occupation: driver  Tobacco Use   Smoking status: Never   Smokeless tobacco: Never  Vaping Use   Vaping Use: Never used  Substance and Sexual Activity   Alcohol use: Yes    Comment: Once a month    Drug use: No   Sexual activity: Yes    Partners: Female    Birth control/protection: Surgical    Comment: vasectomy  Other Topics Concern   Not on file  Social History Narrative   Lives at home with his wife   Right handed   Caffeine: 1 cup of coffee in the mornings    Social  Determinants of Health   Financial Resource Strain: Not on file  Food Insecurity: Not on file  Transportation Needs: Not on file  Physical Activity: Not on file  Stress: Not on file  Social Connections: Not on file    Family History  Problem Relation Age of Onset   Cirrhosis Mother        NASH   Other Mother        Delbert Phenix as a child   Hyperlipidemia Father    Asthma Father    Heart failure Father    Colon cancer Neg Hx    Esophageal cancer Neg Hx    Rectal cancer Neg Hx    Stomach cancer Neg Hx     Health Maintenance  Topic Date Due   COVID-19 Vaccine (4 - Pfizer series) 01/25/2020   Zoster Vaccines- Shingrix (2 of 2) 06/29/2021   COLONOSCOPY (Pts 45-79yr Insurance coverage will need to be confirmed)  12/05/2025   TETANUS/TDAP  08/14/2028   INFLUENZA VACCINE  Completed   Hepatitis C Screening  Completed   HIV Screening  Completed   HPV VACCINES  Aged Out     -----------------------------------------------------------------------------------------------------------------------------------------------------------------------------------------------------------------  Physical Exam BP 114/74 (BP Location: Left Arm, Patient Position: Sitting, Cuff Size: Normal)   Pulse 67   Ht '5\' 8"'$  (1.727 m)   Wt 164 lb 1.3 oz (74.4 kg)   SpO2 95%   BMI 24.95 kg/m   Physical Exam Constitutional:      Appearance: Normal appearance.  HENT:     Head: Normocephalic and atraumatic.  Eyes:     General: No scleral icterus. Cardiovascular:     Rate and Rhythm: Normal rate and regular rhythm.  Pulmonary:     Effort: Pulmonary effort is normal.     Breath sounds: Normal breath sounds.  Skin:    Comments: Scaly, dry appearing rash under L eyelid.   Neurological:     General: No focal deficit present.     Mental Status: He is alert.  Psychiatric:        Mood and Affect: Mood normal.        Behavior: Behavior normal.      ------------------------------------------------------------------------------------------------------------------------------------------------------------------------------------------------------------------- Assessment and Plan  Essential hypertension Doing well with lisinopril at current strength.  Recommend continuation.   Benign prostatic hyperplasia Remains on proscar and flomax.  Stable symptoms.  Lab Results  Component Value Date   PSA1 2.1 08/05/2016   PSA 1.78 05/20/2021   PSA 2.3 02/14/2019   PSA 2.23 09/14/2014      Hemiparesis affecting left side as late effect of stroke ([place]) Stable at this time.  Continue on plavix and atorvastatin.  Lab Results  Component Value Date   LDLCALC 72 05/20/2021     Eyelid dermatitis, allergic/contact He can try OTC hydrocortisone for up to 2 weeks.  Recommend topical moisturizer/emmollient as well.  Let me know if worsening.     Meds ordered this encounter  Medications   lisinopril (ZESTRIL) 10 MG tablet    Sig: Take 1 tablet (10 mg total) by mouth daily.    Dispense:  90 tablet    Refill:  2   tamsulosin (FLOMAX) 0.4 MG CAPS capsule    Sig: Take 1 capsule (0.4 mg total) by mouth daily.    Dispense:  90 capsule    Refill:  1    Return in about 6 months (around 05/30/2022) for Annual exam/Fasting labs.    This visit occurred during the SARS-CoV-2 public health emergency.  Safety protocols were in place, including screening questions prior to the visit, additional usage of staff PPE, and extensive cleaning of exam room while observing appropriate contact time as indicated for disinfecting solutions.

## 2021-11-29 NOTE — Assessment & Plan Note (Signed)
Remains on proscar and flomax.  Stable symptoms.  Lab Results  Component Value Date   PSA1 2.1 08/05/2016   PSA 1.78 05/20/2021   PSA 2.3 02/14/2019   PSA 2.23 09/14/2014

## 2021-11-29 NOTE — Assessment & Plan Note (Signed)
He can try OTC hydrocortisone for up to 2 weeks.  Recommend topical moisturizer/emmollient as well.  Let me know if worsening.

## 2021-11-29 NOTE — Assessment & Plan Note (Signed)
Doing well with lisinopril at current strength.  Recommend continuation.

## 2021-11-29 NOTE — Patient Instructions (Signed)
Use hydrocortisone on the lower eyelid for up to 2 weeks Try moisturizer on eyelid such as cerave or eucerin.

## 2021-11-29 NOTE — Assessment & Plan Note (Signed)
Stable at this time.  Continue on plavix and atorvastatin.  Lab Results  Component Value Date   LDLCALC 72 05/20/2021

## 2022-04-16 ENCOUNTER — Other Ambulatory Visit: Payer: Self-pay | Admitting: Family Medicine

## 2022-04-16 DIAGNOSIS — I6381 Other cerebral infarction due to occlusion or stenosis of small artery: Secondary | ICD-10-CM

## 2022-04-16 DIAGNOSIS — E785 Hyperlipidemia, unspecified: Secondary | ICD-10-CM

## 2022-05-30 ENCOUNTER — Encounter: Payer: 59 | Admitting: Family Medicine

## 2022-06-08 ENCOUNTER — Telehealth: Payer: Self-pay | Admitting: Family Medicine

## 2022-06-08 NOTE — Telephone Encounter (Signed)
Pam with [facility] called.  Patient needs clearance for surgery for TURP on 6/19, hold plavix 5 days prior to surgery. Pam will fax over surgical clearance.

## 2022-06-08 NOTE — Telephone Encounter (Signed)
Sounds good

## 2022-06-08 NOTE — Telephone Encounter (Signed)
He'll need an OV for surgical clearance.   CM

## 2022-06-26 ENCOUNTER — Ambulatory Visit (INDEPENDENT_AMBULATORY_CARE_PROVIDER_SITE_OTHER): Payer: 59 | Admitting: Family Medicine

## 2022-06-26 ENCOUNTER — Encounter: Payer: Self-pay | Admitting: Family Medicine

## 2022-06-26 VITALS — BP 108/70 | HR 60 | Ht 68.0 in | Wt 161.0 lb

## 2022-06-26 DIAGNOSIS — Z0000 Encounter for general adult medical examination without abnormal findings: Secondary | ICD-10-CM | POA: Insufficient documentation

## 2022-06-26 DIAGNOSIS — N400 Enlarged prostate without lower urinary tract symptoms: Secondary | ICD-10-CM | POA: Diagnosis not present

## 2022-06-26 DIAGNOSIS — E785 Hyperlipidemia, unspecified: Secondary | ICD-10-CM | POA: Diagnosis not present

## 2022-06-26 DIAGNOSIS — I10 Essential (primary) hypertension: Secondary | ICD-10-CM | POA: Diagnosis not present

## 2022-06-26 NOTE — Assessment & Plan Note (Signed)
Has upcoming TURP.  I think he can remain off of plavix x5 days prior to procedure but should resume immediately the day following the procedure.

## 2022-06-26 NOTE — Assessment & Plan Note (Signed)
Well adult Orders Placed This Encounter  Procedures  . COMPLETE METABOLIC PANEL WITH GFR  . CBC with Differential  . Lipid Panel w/reflex Direct LDL  Screenings: per lab orders Immunizations:  UTD Anticipatory guidance/Risk factor reduction:  Recommendations per AVS.  

## 2022-06-26 NOTE — Patient Instructions (Signed)

## 2022-06-26 NOTE — Progress Notes (Signed)
Robert Sandoval - 62 y.o. male MRN [id number]  Date of birth: 1960-02-05  Subjective Chief Complaint  Patient presents with   Annual Exam    HPI Robert Sandoval is a 62 y.o. male here today for annual exam.   He has upcoming TURP and needs surgical clearance and recommendations for anti-platelet at this time. He has history fo CVA in 2020 and has been on plavix since that time.   He is moderately active.  He feels that his diet is pretty good.   He is a non-smoker.  Rare EtOH use.   He is up to date on screenings.   Review of Systems  Constitutional:  Negative for chills, fever, malaise/fatigue and weight loss.  HENT:  Negative for congestion, ear pain and sore throat.   Eyes:  Negative for blurred vision, double vision and pain.  Respiratory:  Negative for cough and shortness of breath.   Cardiovascular:  Negative for chest pain and palpitations.  Gastrointestinal:  Negative for abdominal pain, blood in stool, constipation, heartburn and nausea.  Genitourinary:  Negative for dysuria and urgency.  Musculoskeletal:  Negative for joint pain and myalgias.  Neurological:  Negative for dizziness and headaches.  Endo/Heme/Allergies:  Does not bruise/bleed easily.  Psychiatric/Behavioral:  Negative for depression. The patient is not nervous/anxious and does not have insomnia.     No Known Allergies  Past Medical History:  Diagnosis Date   Allergy    BPH (benign prostatic hyperplasia)    followed by [facility]   Hyperlipidemia    Hypertension    Low back pain radiating to left lower extremity    sees Dr. Shelle Iron   Stroke [facility])    05/2017    Past Surgical History:  Procedure Laterality Date   COLONOSCOPY     ESOPHAGOGASTRODUODENOSCOPY     REZUM      Social History   Socioeconomic History   Marital status: Married    Spouse name: Not on file   Number of children: 1   Years of education: Not on file   Highest education level: High school graduate  Occupational History    Occupation: driver  Tobacco Use   Smoking status: Never   Smokeless tobacco: Never  Vaping Use   Vaping Use: Never used  Substance and Sexual Activity   Alcohol use: Yes    Comment: Once a month    Drug use: No   Sexual activity: Yes    Partners: Female    Birth control/protection: Surgical    Comment: vasectomy  Other Topics Concern   Not on file  Social History Narrative   Lives at home with his wife   Right handed   Caffeine: 1 cup of coffee in the mornings    Social Determinants of Health   Financial Resource Strain: Not on file  Food Insecurity: Not on file  Transportation Needs: Not on file  Physical Activity: Not on file  Stress: Not on file  Social Connections: Not on file    Family History  Problem Relation Age of Onset   Cirrhosis Mother        NASH   Other Mother        Tsosie Billing as a child   Hyperlipidemia Father    Asthma Father    Heart failure Father    Colon cancer Neg Hx    Esophageal cancer Neg Hx    Rectal cancer Neg Hx    Stomach cancer Neg Hx     Health Maintenance  Topic Date Due   COVID-19 Vaccine (3 - 2023-24 season) 09/26/2022 (Originally 09/16/2021)   INFLUENZA VACCINE  08/17/2022   Colonoscopy  12/05/2025   DTaP/Tdap/Td (2 - Td or Tdap) 08/14/2028   Hepatitis C Screening  Completed   HIV Screening  Completed   Zoster Vaccines- Shingrix  Completed   HPV VACCINES  Aged Out     ----------------------------------------------------------------------------------------------------------------------------------------------------------------------------------------------------------------- Physical Exam BP 108/70 (BP Location: Left Arm, Patient Position: Sitting, Cuff Size: Normal)   Pulse 60   Ht 5\' 8"  (1.727 m)   Wt 161 lb (73 kg)   SpO2 98%   BMI 24.48 kg/m   Physical Exam Constitutional:      General: He is not in acute distress. HENT:     Head: Normocephalic and atraumatic.     Right Ear: Tympanic membrane and external ear  normal.     Left Ear: Tympanic membrane and external ear normal.  Eyes:     General: No scleral icterus. Neck:     Thyroid: No thyromegaly.  Cardiovascular:     Rate and Rhythm: Normal rate and regular rhythm.     Heart sounds: Normal heart sounds.  Pulmonary:     Effort: Pulmonary effort is normal.     Breath sounds: Normal breath sounds.  Abdominal:     General: Bowel sounds are normal. There is no distension.     Palpations: Abdomen is soft.     Tenderness: There is no abdominal tenderness. There is no guarding.  Musculoskeletal:     Cervical back: Normal range of motion.  Lymphadenopathy:     Cervical: No cervical adenopathy.  Skin:    General: Skin is warm and dry.     Findings: No rash.  Neurological:     Mental Status: He is alert and oriented to person, place, and time.     Cranial Nerves: No cranial nerve deficit.     Motor: No abnormal muscle tone.  Psychiatric:        Mood and Affect: Mood normal.        Behavior: Behavior normal.     ------------------------------------------------------------------------------------------------------------------------------------------------------------------------------------------------------------------- Assessment and Plan  Well adult exam Well adult Orders Placed This Encounter  Procedures   COMPLETE METABOLIC PANEL WITH GFR   CBC with Differential   Lipid Panel w/reflex Direct LDL  Screenings: per lab orders Immunizations: UTD Anticipatory guidance/Risk factor reduction: Recommendations per AVS.   Benign prostatic hyperplasia Has upcoming TURP.  I think he can remain off of plavix x5 days prior to procedure but should resume immediately the day following the procedure.      No orders of the defined types were placed in this encounter.   No follow-ups on file.    This visit occurred during the SARS-CoV-2 public health emergency.  Safety protocols were in place, including screening questions prior to the  visit, additional usage of staff PPE, and extensive cleaning of exam room while observing appropriate contact time as indicated for disinfecting solutions.

## 2022-06-27 LAB — CBC WITH DIFFERENTIAL/PLATELET
Absolute Monocytes: 503 cells/uL (ref 200–950)
Basophils Absolute: 80 cells/uL (ref 0–200)
Basophils Relative: 1.7 %
Eosinophils Absolute: 132 cells/uL (ref 15–500)
Eosinophils Relative: 2.8 %
HCT: 39.6 % (ref 38.5–50.0)
Hemoglobin: 12.4 g/dL — ABNORMAL LOW (ref 13.2–17.1)
Lymphs Abs: 1654 cells/uL (ref 850–3900)
MCH: 27.4 pg (ref 27.0–33.0)
MCHC: 31.3 g/dL — ABNORMAL LOW (ref 32.0–36.0)
MCV: 87.4 fL (ref 80.0–100.0)
MPV: 9.8 fL (ref 7.5–12.5)
Monocytes Relative: 10.7 %
Neutro Abs: 2331 cells/uL (ref 1500–7800)
Neutrophils Relative %: 49.6 %
Platelets: 300 10*3/uL (ref 140–400)
RBC: 4.53 10*6/uL (ref 4.20–5.80)
RDW: 12.6 % (ref 11.0–15.0)
Total Lymphocyte: 35.2 %
WBC: 4.7 10*3/uL (ref 3.8–10.8)

## 2022-06-27 LAB — COMPLETE METABOLIC PANEL WITH GFR
AG Ratio: 1.6 (calc) (ref 1.0–2.5)
ALT: 13 U/L (ref 9–46)
AST: 18 U/L (ref 10–35)
Albumin: 4.2 g/dL (ref 3.6–5.1)
Alkaline phosphatase (APISO): 103 U/L (ref 35–144)
BUN: 10 mg/dL (ref 7–25)
CO2: 25 mmol/L (ref 20–32)
Calcium: 9.2 mg/dL (ref 8.6–10.3)
Chloride: 107 mmol/L (ref 98–110)
Creat: 0.85 mg/dL (ref 0.70–1.35)
Globulin: 2.6 g/dL (calc) (ref 1.9–3.7)
Glucose, Bld: 87 mg/dL (ref 65–99)
Potassium: 4.1 mmol/L (ref 3.5–5.3)
Sodium: 139 mmol/L (ref 135–146)
Total Bilirubin: 1 mg/dL (ref 0.2–1.2)
Total Protein: 6.8 g/dL (ref 6.1–8.1)
eGFR: 98 mL/min/{1.73_m2} (ref >=60)

## 2022-06-27 LAB — LIPID PANEL W/REFLEX DIRECT LDL
Cholesterol: 128 mg/dL (ref <200)
HDL: 39 mg/dL — ABNORMAL LOW (ref >=40)
LDL Cholesterol (Calc): 68 mg/dL (calc)
Non-HDL Cholesterol (Calc): 89 mg/dL (calc) (ref <130)
Total CHOL/HDL Ratio: 3.3 (calc) (ref <5.0)
Triglycerides: 129 mg/dL (ref <150)

## 2022-07-05 ENCOUNTER — Other Ambulatory Visit: Payer: Self-pay | Admitting: Ophthalmology

## 2022-07-07 ENCOUNTER — Other Ambulatory Visit: Payer: Self-pay | Admitting: Family Medicine

## 2022-07-07 DIAGNOSIS — D649 Anemia, unspecified: Secondary | ICD-10-CM

## 2022-07-13 ENCOUNTER — Other Ambulatory Visit: Payer: Self-pay | Admitting: Family Medicine

## 2022-07-13 DIAGNOSIS — I6381 Other cerebral infarction due to occlusion or stenosis of small artery: Secondary | ICD-10-CM

## 2022-07-13 DIAGNOSIS — I10 Essential (primary) hypertension: Secondary | ICD-10-CM

## 2022-07-13 DIAGNOSIS — E785 Hyperlipidemia, unspecified: Secondary | ICD-10-CM

## 2022-11-28 DIAGNOSIS — N400 Enlarged prostate without lower urinary tract symptoms: Secondary | ICD-10-CM | POA: Diagnosis not present

## 2022-12-05 DIAGNOSIS — N400 Enlarged prostate without lower urinary tract symptoms: Secondary | ICD-10-CM | POA: Diagnosis not present

## 2022-12-06 DIAGNOSIS — H52213 Irregular astigmatism, bilateral: Secondary | ICD-10-CM | POA: Diagnosis not present

## 2022-12-06 DIAGNOSIS — H40013 Open angle with borderline findings, low risk, bilateral: Secondary | ICD-10-CM | POA: Diagnosis not present

## 2022-12-06 DIAGNOSIS — H2513 Age-related nuclear cataract, bilateral: Secondary | ICD-10-CM | POA: Diagnosis not present

## 2022-12-06 DIAGNOSIS — H11043 Peripheral pterygium, stationary, bilateral: Secondary | ICD-10-CM | POA: Diagnosis not present

## 2022-12-06 DIAGNOSIS — H5203 Hypermetropia, bilateral: Secondary | ICD-10-CM | POA: Diagnosis not present

## 2022-12-06 DIAGNOSIS — H1789 Other corneal scars and opacities: Secondary | ICD-10-CM | POA: Diagnosis not present

## 2022-12-06 DIAGNOSIS — H524 Presbyopia: Secondary | ICD-10-CM | POA: Diagnosis not present

## 2023-01-06 ENCOUNTER — Other Ambulatory Visit: Payer: Self-pay | Admitting: Family Medicine

## 2023-01-06 DIAGNOSIS — I10 Essential (primary) hypertension: Secondary | ICD-10-CM

## 2023-01-08 NOTE — Telephone Encounter (Signed)
Pls contact patient to schedule HTN appt. thanks

## 2023-01-23 ENCOUNTER — Ambulatory Visit (INDEPENDENT_AMBULATORY_CARE_PROVIDER_SITE_OTHER): Payer: 59 | Admitting: Family Medicine

## 2023-01-23 VITALS — BP 116/67 | HR 73 | Ht 68.0 in | Wt 164.0 lb

## 2023-01-23 DIAGNOSIS — D649 Anemia, unspecified: Secondary | ICD-10-CM | POA: Diagnosis not present

## 2023-01-23 DIAGNOSIS — Z23 Encounter for immunization: Secondary | ICD-10-CM | POA: Diagnosis not present

## 2023-01-23 DIAGNOSIS — I6381 Other cerebral infarction due to occlusion or stenosis of small artery: Secondary | ICD-10-CM

## 2023-01-23 DIAGNOSIS — Z8673 Personal history of transient ischemic attack (TIA), and cerebral infarction without residual deficits: Secondary | ICD-10-CM

## 2023-01-23 DIAGNOSIS — N402 Nodular prostate without lower urinary tract symptoms: Secondary | ICD-10-CM

## 2023-01-23 DIAGNOSIS — I10 Essential (primary) hypertension: Secondary | ICD-10-CM

## 2023-01-23 MED ORDER — CLOPIDOGREL BISULFATE 75 MG PO TABS: 75.0000 mg | ORAL_TABLET | Freq: Every day | ORAL | 3 refills | Status: DC

## 2023-01-23 NOTE — Assessment & Plan Note (Signed)
 S/p TURP for BPH.  Doing well.  Remains on finasteride.

## 2023-01-23 NOTE — Addendum Note (Signed)
 Addended by: Ardyth Man on: 01/23/2023 02:11 PM   Modules accepted: Orders

## 2023-01-23 NOTE — Assessment & Plan Note (Signed)
Doing well with lisinopril at current strength.  Recommend continuation.

## 2023-01-23 NOTE — Assessment & Plan Note (Signed)
 Update cbc and check iron panel.

## 2023-01-23 NOTE — Assessment & Plan Note (Signed)
 Residual deficits have improved from his initial stroke presentation. He will continue plavix with atorvastatin.

## 2023-01-23 NOTE — Progress Notes (Signed)
 Robert Sandoval - 63 y.o. male MRN [id number]  Date of birth: 1960-08-26  Subjective Chief Complaint  Patient presents with   Hypertension   Medical Management of Chronic Issues    HPI Robert Sandoval is a 63 y.o. male here today for follow up visit.   Continues on lisinopril  for management of HTN.  BP remains well controlled.  He denies side effects from medication at current strength.  He has not had chest pain, shortness of breath, palpitations, headache or vision changes.   History of CVA in 2020.  He has remained on plavix  since this time.  Has been out for about 3 months.  He reports requesting refill through his pharmacy but I don't see where we received the request.   Remains on atorvastatin .   Had TURP over the Summer for BPH.  Had some incontinence initially but now doing well from this.  Remains on finasteride.  Off tamsulosin  now.   ROS:  A comprehensive ROS was completed and negative except as noted per HPI  No Known Allergies  Past Medical History:  Diagnosis Date   Allergy    BPH (benign prostatic hyperplasia)    followed by [facility]   Hyperlipidemia    Hypertension    Low back pain radiating to left lower extremity    sees Dr. Duwayne   Stroke [facility])    05/2017    Past Surgical History:  Procedure Laterality Date   COLONOSCOPY     ESOPHAGOGASTRODUODENOSCOPY     REZUM      Social History   Socioeconomic History   Marital status: Married    Spouse name: Not on file   Number of children: 1   Years of education: Not on file   Highest education level: GED or equivalent  Occupational History   Occupation: driver  Tobacco Use   Smoking status: Never   Smokeless tobacco: Never  Vaping Use   Vaping status: Never Used  Substance and Sexual Activity   Alcohol use: Yes    Comment: Once a month    Drug use: No   Sexual activity: Yes    Partners: Female    Birth control/protection: Surgical    Comment: vasectomy  Other Topics Concern   Not on file   Social History Narrative   Lives at home with his wife   Right handed   Caffeine: 1 cup of coffee in the mornings    Social Drivers of Health   Financial Resource Strain: Low Risk  (01/23/2023)   Overall Financial Resource Strain (CARDIA)    Difficulty of Paying Living Expenses: Not hard at all  Food Insecurity: No Food Insecurity (01/23/2023)   Hunger Vital Sign    Worried About Running Out of Food in the Last Year: Never true    Ran Out of Food in the Last Year: Never true  Transportation Needs: No Transportation Needs (01/23/2023)   PRAPARE - Administrator, Civil Service (Medical): No    Lack of Transportation (Non-Medical): No  Physical Activity: Sufficiently Active (01/23/2023)   Exercise Vital Sign    Days of Exercise per Week: 5 days    Minutes of Exercise per Session: 40 min  Stress: No Stress Concern Present (01/23/2023)   [employer] of Occupational Health - Occupational Stress Questionnaire    Feeling of Stress : Not at all  Social Connections: Moderately Isolated (01/23/2023)   Social Connection and Isolation Panel [NHANES]    Frequency of Communication with Friends and  Family: Once a week    Frequency of Social Gatherings with Friends and Family: Once a week    Attends Religious Services: 1 to 4 times per year    Active Member of [employer] or Organizations: No    Attends Engineer, Structural: Not on file    Marital Status: Married    Family History  Problem Relation Age of Onset   Cirrhosis Mother        NASH   Other Mother        [place] as a child   Hyperlipidemia Father    Asthma Father    Heart failure Father    Colon cancer Neg Hx    Esophageal cancer Neg Hx    Rectal cancer Neg Hx    Stomach cancer Neg Hx     Health Maintenance  Topic Date Due   INFLUENZA VACCINE  08/17/2022   COVID-19 Vaccine (3 - 2024-25 season) 09/17/2022   Colonoscopy  12/05/2025   DTaP/Tdap/Td (2 - Td or Tdap) 08/14/2028   Hepatitis C Screening  Completed    HIV Screening  Completed   Zoster Vaccines- Shingrix   Completed   HPV VACCINES  Aged Out     ----------------------------------------------------------------------------------------------------------------------------------------------------------------------------------------------------------------- Physical Exam BP 116/67 (BP Location: Left Arm, Patient Position: Sitting, Cuff Size: Normal)   Pulse 73   Ht 5' 8 (1.727 m)   Wt 164 lb (74.4 kg)   SpO2 98%   BMI 24.94 kg/m   Physical Exam Constitutional:      Appearance: Normal appearance.  HENT:     Head: Normocephalic and atraumatic.  Cardiovascular:     Rate and Rhythm: Normal rate and regular rhythm.  Pulmonary:     Effort: Pulmonary effort is normal.     Breath sounds: Normal breath sounds.  Neurological:     General: No focal deficit present.     Mental Status: He is alert.  Psychiatric:        Mood and Affect: Mood normal.        Behavior: Behavior normal.     ------------------------------------------------------------------------------------------------------------------------------------------------------------------------------------------------------------------- Assessment and Plan  History of cerebrovascular accident Residual deficits have improved from his initial stroke presentation. He will continue plavix  with atorvastatin .    Prostate nodule S/p TURP for BPH.  Doing well.  Remains on finasteride.   Essential hypertension Doing well with lisinopril  at current strength.  Recommend continuation.   Anemia Update cbc and check iron panel.    Meds ordered this encounter  Medications   clopidogrel  (PLAVIX ) 75 MG tablet    Sig: Take 1 tablet (75 mg total) by mouth daily.    Dispense:  90 tablet    Refill:  3    Return in about 6 months (around 07/23/2023) for Hypertension, history of CVA.    This visit occurred during the SARS-CoV-2 public health emergency.  Safety protocols were in place,  including screening questions prior to the visit, additional usage of staff PPE, and extensive cleaning of exam room while observing appropriate contact time as indicated for disinfecting solutions.

## 2023-01-24 DIAGNOSIS — D649 Anemia, unspecified: Secondary | ICD-10-CM | POA: Diagnosis not present

## 2023-01-25 LAB — CBC WITH DIFFERENTIAL/PLATELET
Absolute Lymphocytes: 1659 {cells}/uL (ref 850–3900)
Absolute Monocytes: 484 {cells}/uL (ref 200–950)
Basophils Absolute: 80 {cells}/uL (ref 0–200)
Basophils Relative: 1.7 %
Eosinophils Absolute: 80 {cells}/uL (ref 15–500)
Eosinophils Relative: 1.7 %
HCT: 32.3 % — ABNORMAL LOW (ref 38.5–50.0)
Hemoglobin: 9.2 g/dL — ABNORMAL LOW (ref 13.2–17.1)
MCH: 19.9 pg — ABNORMAL LOW (ref 27.0–33.0)
MCHC: 28.5 g/dL — ABNORMAL LOW (ref 32.0–36.0)
MCV: 69.9 fL — ABNORMAL LOW (ref 80.0–100.0)
MPV: 9.4 fL (ref 7.5–12.5)
Monocytes Relative: 10.3 %
Neutro Abs: 2397 {cells}/uL (ref 1500–7800)
Neutrophils Relative %: 51 %
Platelets: 405 10*3/uL — ABNORMAL HIGH (ref 140–400)
RBC: 4.62 10*6/uL (ref 4.20–5.80)
RDW: 16.3 % — ABNORMAL HIGH (ref 11.0–15.0)
Total Lymphocyte: 35.3 %
WBC: 4.7 10*3/uL (ref 3.8–10.8)

## 2023-01-25 LAB — IRON,TIBC AND FERRITIN PANEL
%SAT: 3 % — ABNORMAL LOW (ref 20–48)
Ferritin: 4 ng/mL — ABNORMAL LOW (ref 24–380)
Iron: 17 ug/dL — ABNORMAL LOW (ref 50–180)
TIBC: 493 ug/dL — ABNORMAL HIGH (ref 250–425)

## 2023-01-25 LAB — CBC MORPHOLOGY

## 2023-02-06 ENCOUNTER — Other Ambulatory Visit: Payer: Self-pay | Admitting: Family Medicine

## 2023-02-06 DIAGNOSIS — I10 Essential (primary) hypertension: Secondary | ICD-10-CM

## 2023-02-08 ENCOUNTER — Ambulatory Visit
Admission: EM | Admit: 2023-02-08 | Discharge: 2023-02-08 | Disposition: A | Discharge status: Home / Self Care | Payer: BC Managed Care – PPO | Attending: Emergency Medicine | Admitting: Emergency Medicine

## 2023-02-08 DIAGNOSIS — J069 Acute upper respiratory infection, unspecified: Secondary | ICD-10-CM

## 2023-02-08 LAB — POCT INFLUENZA A/B
Influenza A, POC: NEGATIVE
Influenza B, POC: NEGATIVE

## 2023-02-08 MED ORDER — PROMETHAZINE-DM 6.25-15 MG/5ML PO SYRP: 5.0000 mL | ORAL_SOLUTION | Freq: Four times a day (QID) | ORAL | 0 refills | Status: DC | PRN

## 2023-02-08 MED ORDER — BENZONATATE 100 MG PO CAPS
100.0000 mg | ORAL_CAPSULE | Freq: Three times a day (TID) | ORAL | 0 refills | Status: DC | PRN
Start: 2023-02-08 — End: 2023-07-25

## 2023-02-08 NOTE — ED Triage Notes (Signed)
Pt c/o sore throat, sneezing, fever, body aches x 2-3 days. Has been taking Tylenol and OTC cold meds for relief.

## 2023-02-08 NOTE — ED Provider Notes (Signed)
[facility]    CSN: [id number] Arrival date & time: 02/08/23  1244      History   Chief Complaint Chief Complaint  Patient presents with   Cough   Fever    HPI Jiarui Seat is a 63 y.o. male.  2-3 day history of tactile fever, nasal congestion, and dry cough Only felt feverish on day 1 Not having sore throat, body aches, ear pain, NVD Using a medicine from [place] and tylenol Wife sick with similar   Past Medical History:  Diagnosis Date   Allergy    BPH (benign prostatic hyperplasia)    followed by [facility]   Hyperlipidemia    Hypertension    Low back pain radiating to left lower extremity    sees Dr. Shelle Iron   Stroke [facility])    05/2017    Patient Active Problem List   Diagnosis Date Noted   Anemia 01/23/2023   Well adult exam 06/26/2022   Eyelid dermatitis, allergic/contact 11/29/2021   Insect bite 08/05/2021   Other fatigue 01/03/2018   Alteration of sensation as late effect of stroke 09/07/2017   History of cerebrovascular accident 08/22/2017   Low back pain 08/22/2017   Spinal stenosis of lumbar region 08/21/2017   Degeneration of lumbar intervertebral disc 08/07/2017   Lumbar radiculopathy 08/07/2017   Benign prostatic hyperplasia    Hemiparesis affecting left side as late effect of stroke (HCC)    Basal ganglia infarction (HCC) 06/07/2017   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 [facility])    Hyperlipidemia    Acute ischemic stroke (HCC) - R basal ganglia s/p IV tPA 06/05/2017   Prostate nodule 10/30/2016   Essential hypertension 08/05/2016   External hemorrhoids without complication 11/23/2015   Melanosis of colon 11/23/2015   Diverticulosis of colon without hemorrhage 11/23/2015   Internal hemorrhoids 11/23/2015   Essential hypertension, benign 09/14/2014    Past Surgical History:  Procedure Laterality Date   COLONOSCOPY     ESOPHAGOGASTRODUODENOSCOPY     REZUM         Home  Medications    Prior to Admission medications   Medication Sig Start Date End Date Taking? Authorizing Provider  albuterol (VENTOLIN HFA) 108 (90 Base) MCG/ACT inhaler Inhale 2 puffs into the lungs every 6 (six) hours as needed for wheezing or shortness of breath. 04/15/21  Yes Shade Flood, MD  atorvastatin (LIPITOR) 20 MG tablet TAKE 1 TABLET BY MOUTH DAILY AT 6 PM. 07/13/22  Yes Everrett Coombe, DO  benzonatate (TESSALON) 100 MG capsule Take 1 capsule (100 mg total) by mouth 3 (three) times daily as needed for cough. 02/08/23  Yes Kateline Kinkade, Lurena Joiner, PA-C  clopidogrel (PLAVIX) 75 MG tablet Take 1 tablet (75 mg total) by mouth daily. 01/23/23  Yes Everrett Coombe, DO  finasteride (PROSCAR) 5 MG tablet Take 5 mg by mouth daily. 03/22/21  Yes [provider]  fluticasone (FLONASE) 50 MCG/ACT nasal spray Place 1 spray into both nostrils daily. 03/14/21  Yes Eustace Moore, MD  fluticasone (FLOVENT HFA) 44 MCG/ACT inhaler Inhale 1-2 puffs into the lungs in the morning and at bedtime. 04/15/21  Yes Shade Flood, MD  lisinopril (ZESTRIL) 10 MG tablet TAKE 1 TABLET (10 MG TOTAL) BY MOUTH DAILY. NEEDS APPT FOR REFILLS 02/06/23  Yes Everrett Coombe, DO  loratadine (CLARITIN) 10 MG tablet Take 10 mg by mouth daily as needed for allergies.   Yes [provider]  polyethylene glycol (MIRALAX /  GLYCOLAX) 17 g packet Take 17 g by mouth daily. PRN   Yes [provider]  promethazine-dextromethorphan (PROMETHAZINE-DM) 6.25-15 MG/5ML syrup Take 5 mLs by mouth 4 (four) times daily as needed for cough. 02/08/23  Yes Radley Barto, Lurena Joiner, PA-C  tamsulosin (FLOMAX) 0.4 MG CAPS capsule Take 1 capsule (0.4 mg total) by mouth daily. 11/29/21  Yes Everrett Coombe, DO    Family History Family History  Problem Relation Age of Onset   Cirrhosis Mother        NASH   Other Mother        Tsosie Billing as a child   Hyperlipidemia Father    Asthma Father    Heart failure Father    Colon cancer Neg Hx     Esophageal cancer Neg Hx    Rectal cancer Neg Hx    Stomach cancer Neg Hx     Social History Social History   Tobacco Use   Smoking status: Never   Smokeless tobacco: Never  Vaping Use   Vaping status: Never Used  Substance Use Topics   Alcohol use: Yes    Comment: Once a month    Drug use: No     Allergies   Patient has no known allergies.   Review of Systems Review of Systems Per HPI  Physical Exam Triage Vital Signs ED Triage Vitals  Encounter Vitals Group     BP 02/08/23 1336 135/85     Systolic BP Percentile --      Diastolic BP Percentile --      Pulse Rate 02/08/23 1336 73     Resp 02/08/23 1336 18     Temp 02/08/23 1336 98 F (36.7 C)     Temp Source 02/08/23 1336 Oral     SpO2 02/08/23 1336 99 %     Weight --      Height --      Head Circumference --      Peak Flow --      Pain Score 02/08/23 1333 0     Pain Loc --      Pain Education --      Exclude from Growth Chart --    No data found.  Updated Vital Signs BP 135/85 (BP Location: Right Arm)   Pulse 73   Temp 98 F (36.7 C) (Oral)   Resp 18   SpO2 99%   Physical Exam Vitals and nursing note reviewed.  Constitutional:      Appearance: He is not ill-appearing.  HENT:     Right Ear: Tympanic membrane and ear canal normal.     Left Ear: Tympanic membrane and ear canal normal.     Nose: No rhinorrhea.     Mouth/Throat:     Mouth: Mucous membranes are moist.     Pharynx: Oropharynx is clear. No posterior oropharyngeal erythema.  Eyes:     Conjunctiva/sclera: Conjunctivae normal.  Cardiovascular:     Rate and Rhythm: Normal rate and regular rhythm.     Pulses: Normal pulses.     Heart sounds: Normal heart sounds.  Pulmonary:     Effort: Pulmonary effort is normal.     Breath sounds: Normal breath sounds. No wheezing or rales.  Musculoskeletal:     Cervical back: Normal range of motion.  Lymphadenopathy:     Cervical: No cervical adenopathy.  Skin:    General: Skin is warm and  dry.  Neurological:     Mental Status: He is alert and oriented to person,  place, and time.      UC Treatments / Results  Labs (all labs ordered are listed, but only abnormal results are displayed) Labs Reviewed  POCT INFLUENZA A/B    EKG   Radiology No results found.  Procedures Procedures (including critical care time)  Medications Ordered in UC Medications - No data to display  Initial Impression / Assessment and Plan / UC Course  I have reviewed the triage vital signs and the nursing notes.  Pertinent labs & imaging results that were available during my care of the patient were reviewed by me and considered in my medical decision making (see chart for details).  Afebrile, clear lungs Requesting flu test today; A and B negative  Symptomatic care for viral etiology. Discussed OTC options. Sent tessalon and promethazine. Advised likely prognosis, return precautions. Work note provided.  Final Clinical Impressions(s) / UC Diagnoses   Final diagnoses:  Viral upper respiratory tract infection     Discharge Instructions      I will call you if the flu test is positive If you do not hear from me within an hour, the test is negative Continue symptomatic care The promethazine DM cough syrup can be used up to 4 times daily. If this medication makes you drowsy, take only once before bed. The tessalon cough pills can be taken 3x daily.  Please return if needed     ED Prescriptions     Medication Sig Dispense Auth. Provider   benzonatate (TESSALON) 100 MG capsule Take 1 capsule (100 mg total) by mouth 3 (three) times daily as needed for cough. 30 capsule Kiana Hollar, PA-C   promethazine-dextromethorphan (PROMETHAZINE-DM) 6.25-15 MG/5ML syrup Take 5 mLs by mouth 4 (four) times daily as needed for cough. 240 mL Kevin Mario, Lurena Joiner, PA-C      PDMP not reviewed this encounter.   Travus Oren, Lurena Joiner, [place] 02/08/23 1501

## 2023-02-08 NOTE — Discharge Instructions (Addendum)
I will call you if the flu test is positive If you do not hear from me within an hour, the test is negative Continue symptomatic care The promethazine DM cough syrup can be used up to 4 times daily. If this medication makes you drowsy, take only once before bed. The tessalon cough pills can be taken 3x daily.  Please return if needed

## 2023-07-05 ENCOUNTER — Other Ambulatory Visit: Payer: Self-pay | Admitting: Family Medicine

## 2023-07-05 DIAGNOSIS — E785 Hyperlipidemia, unspecified: Secondary | ICD-10-CM

## 2023-07-23 ENCOUNTER — Ambulatory Visit: Payer: 59 | Admitting: Family Medicine

## 2023-07-25 ENCOUNTER — Encounter: Payer: Self-pay | Admitting: Family Medicine

## 2023-07-25 ENCOUNTER — Ambulatory Visit: Admitting: Family Medicine

## 2023-07-25 VITALS — BP 114/74 | HR 68 | Resp 20 | Ht 68.0 in | Wt 159.0 lb

## 2023-07-25 DIAGNOSIS — E785 Hyperlipidemia, unspecified: Secondary | ICD-10-CM | POA: Diagnosis not present

## 2023-07-25 DIAGNOSIS — D508 Other iron deficiency anemias: Secondary | ICD-10-CM | POA: Diagnosis not present

## 2023-07-25 DIAGNOSIS — Z8673 Personal history of transient ischemic attack (TIA), and cerebral infarction without residual deficits: Secondary | ICD-10-CM | POA: Diagnosis not present

## 2023-07-25 DIAGNOSIS — I10 Essential (primary) hypertension: Secondary | ICD-10-CM | POA: Diagnosis not present

## 2023-07-25 NOTE — Progress Notes (Signed)
 Robert Sandoval - 63 y.o. male MRN [id number]  Date of birth: 1960/12/27  Subjective Chief Complaint  Patient presents with   Hypertension    recheck   Follow-up    Iron levels    HPI Robert Sandoval is a 63 y.o. male here today for follow up visit.   He reports that he is doing pretty well.  Anemia noted on previous labs and started iron supplement but he did not tolerate this very well.  He is taking something that was recommended from a friend that contains iron.  BP is well controlled.  Doing well with current medications.  Denies side effects.  He has not had chest pain, shortness of breath, palpitations, headache or vision change.  History of CVA and remains on plavix  and statin.  He is tolerating these at current strength.   ROS:  A comprehensive ROS was completed and negative except as noted per HPI  No Known Allergies  Past Medical History:  Diagnosis Date   Allergy    BPH (benign prostatic hyperplasia)    followed by [facility]   Hyperlipidemia    Hypertension    Low back pain radiating to left lower extremity    sees Dr. Duwayne   Stroke [facility]    05/2017    Past Surgical History:  Procedure Laterality Date   COLONOSCOPY     ESOPHAGOGASTRODUODENOSCOPY     REZUM      Social History   Socioeconomic History   Marital status: Married    Spouse name: Not on file   Number of children: 1   Years of education: Not on file   Highest education level: GED or equivalent  Occupational History   Occupation: driver  Tobacco Use   Smoking status: Never   Smokeless tobacco: Never  Vaping Use   Vaping status: Never Used  Substance and Sexual Activity   Alcohol use: Yes    Comment: Once a month    Drug use: No   Sexual activity: Yes    Partners: Female    Birth control/protection: Surgical    Comment: vasectomy  Other Topics Concern   Not on file  Social History Narrative   Lives at home with his wife   Right handed   Caffeine: 1 cup of coffee in the  mornings    Social Drivers of Health   Financial Resource Strain: Low Risk  (01/23/2023)   Overall Financial Resource Strain (CARDIA)    Difficulty of Paying Living Expenses: Not hard at all  Food Insecurity: No Food Insecurity (01/23/2023)   Hunger Vital Sign    Worried About Running Out of Food in the Last Year: Never true    Ran Out of Food in the Last Year: Never true  Transportation Needs: No Transportation Needs (01/23/2023)   PRAPARE - Administrator, Civil Service (Medical): No    Lack of Transportation (Non-Medical): No  Physical Activity: Sufficiently Active (01/23/2023)   Exercise Vital Sign    Days of Exercise per Week: 5 days    Minutes of Exercise per Session: 40 min  Stress: No Stress Concern Present (01/23/2023)   [employer] of Occupational Health - Occupational Stress Questionnaire    Feeling of Stress : Not at all  Social Connections: Moderately Isolated (01/23/2023)   Social Connection and Isolation Panel    Frequency of Communication with Friends and Family: Once a week    Frequency of Social Gatherings with Friends and Family: Once a week  Attends Religious Services: 1 to 4 times per year    Active Member of Clubs or Organizations: No    Attends Engineer, structural: Not on file    Marital Status: Married    Family History  Problem Relation Age of Onset   Cirrhosis Mother        NASH   Other Mother        [place] as a child   Hyperlipidemia Father    Asthma Father    Heart failure Father    Colon cancer Neg Hx    Esophageal cancer Neg Hx    Rectal cancer Neg Hx    Stomach cancer Neg Hx     Health Maintenance  Topic Date Due   COVID-19 Vaccine (3 - 2024-25 season) 09/17/2022   INFLUENZA VACCINE  08/17/2023   Colonoscopy  12/05/2025   DTaP/Tdap/Td (2 - Td or Tdap) 08/14/2028   Hepatitis C Screening  Completed   HIV Screening  Completed   Zoster Vaccines- Shingrix   Completed   Hepatitis B Vaccines  Aged Out   HPV VACCINES   Aged Out   Meningococcal B Vaccine  Aged Out     ----------------------------------------------------------------------------------------------------------------------------------------------------------------------------------------------------------------- Physical Exam BP 114/74   Pulse 68   Resp 20   Ht 5' 8 (1.727 m)   Wt 159 lb (72.1 kg)   SpO2 98%   BMI 24.18 kg/m   Physical Exam Constitutional:      Appearance: Normal appearance.  HENT:     Head: Normocephalic and atraumatic.  Cardiovascular:     Rate and Rhythm: Normal rate and regular rhythm.  Pulmonary:     Effort: Pulmonary effort is normal.     Breath sounds: Normal breath sounds.  Musculoskeletal:     Cervical back: Neck supple.  Neurological:     General: No focal deficit present.     Mental Status: He is alert.  Psychiatric:        Mood and Affect: Mood normal.        Behavior: Behavior normal.     ------------------------------------------------------------------------------------------------------------------------------------------------------------------------------------------------------------------- Assessment and Plan  Essential hypertension Doing well with lisinopril  at current strength.  Recommend continuation.   Anemia Update cbc and check iron panel.   History of cerebrovascular accident Residual deficits have improved from his initial stroke presentation. He will continue plavix  with atorvastatin .     No orders of the defined types were placed in this encounter.   Return in about 6 weeks (around 09/05/2023) for Hypertension.

## 2023-07-25 NOTE — Assessment & Plan Note (Signed)
Doing well with lisinopril at current strength.  Recommend continuation.

## 2023-07-25 NOTE — Assessment & Plan Note (Signed)
 Update cbc and check iron panel.

## 2023-07-25 NOTE — Assessment & Plan Note (Signed)
 Residual deficits have improved from his initial stroke presentation. He will continue plavix with atorvastatin.

## 2023-07-26 LAB — IRON,TIBC AND FERRITIN PANEL
Ferritin: 18 ng/mL — ABNORMAL LOW (ref 30–400)
Iron Saturation: 13 % — ABNORMAL LOW (ref 15–55)
Iron: 54 ug/dL (ref 38–169)
Total Iron Binding Capacity: 427 ug/dL (ref 250–450)
UIBC: 373 ug/dL — ABNORMAL HIGH (ref 111–343)

## 2023-07-26 LAB — CBC WITH DIFFERENTIAL/PLATELET
Basophils Absolute: 0.1 x10E3/uL (ref 0.0–0.2)
Basos: 1 %
EOS (ABSOLUTE): 0.1 x10E3/uL (ref 0.0–0.4)
Eos: 2 %
Hematocrit: 40.8 % (ref 37.5–51.0)
Hemoglobin: 12.5 g/dL — ABNORMAL LOW (ref 13.0–17.7)
Immature Grans (Abs): 0 x10E3/uL (ref 0.0–0.1)
Immature Granulocytes: 0 %
Lymphocytes Absolute: 1.9 x10E3/uL (ref 0.7–3.1)
Lymphs: 27 %
MCH: 24.9 pg — ABNORMAL LOW (ref 26.6–33.0)
MCHC: 30.6 g/dL — ABNORMAL LOW (ref 31.5–35.7)
MCV: 81 fL (ref 79–97)
Monocytes Absolute: 0.6 x10E3/uL (ref 0.1–0.9)
Monocytes: 8 %
Neutrophils Absolute: 4.4 x10E3/uL (ref 1.4–7.0)
Neutrophils: 62 %
Platelets: 279 x10E3/uL (ref 150–450)
RBC: 5.03 x10E6/uL (ref 4.14–5.80)
RDW: 18.5 % — ABNORMAL HIGH (ref 11.6–15.4)
WBC: 7.1 x10E3/uL (ref 3.4–10.8)

## 2023-07-26 LAB — CMP14+EGFR
ALT: 16 IU/L (ref 0–44)
AST: 22 IU/L (ref 0–40)
Albumin: 4 g/dL (ref 3.9–4.9)
Alkaline Phosphatase: 129 IU/L — ABNORMAL HIGH (ref 44–121)
BUN/Creatinine Ratio: 12 (ref 10–24)
BUN: 10 mg/dL (ref 8–27)
Bilirubin Total: 0.8 mg/dL (ref 0.0–1.2)
CO2: 18 mmol/L — ABNORMAL LOW (ref 20–29)
Calcium: 8.9 mg/dL (ref 8.6–10.2)
Chloride: 104 mmol/L (ref 96–106)
Creatinine, Ser: 0.86 mg/dL (ref 0.76–1.27)
Globulin, Total: 3.3 g/dL (ref 1.5–4.5)
Glucose: 87 mg/dL (ref 70–99)
Potassium: 3.9 mmol/L (ref 3.5–5.2)
Sodium: 139 mmol/L (ref 134–144)
Total Protein: 7.3 g/dL (ref 6.0–8.5)
eGFR: 97 mL/min/1.73 (ref >59)

## 2023-07-27 ENCOUNTER — Ambulatory Visit: Payer: Self-pay | Admitting: Family Medicine

## 2023-08-01 ENCOUNTER — Ambulatory Visit: Admitting: Family Medicine

## 2023-08-04 ENCOUNTER — Other Ambulatory Visit: Payer: Self-pay | Admitting: Family Medicine

## 2023-08-04 DIAGNOSIS — I10 Essential (primary) hypertension: Secondary | ICD-10-CM

## 2023-09-05 ENCOUNTER — Ambulatory Visit: Admitting: Family Medicine

## 2023-09-05 VITALS — BP 101/63 | HR 68 | Ht 68.0 in | Wt 160.0 lb

## 2023-09-05 DIAGNOSIS — I10 Essential (primary) hypertension: Secondary | ICD-10-CM

## 2023-09-05 DIAGNOSIS — K219 Gastro-esophageal reflux disease without esophagitis: Secondary | ICD-10-CM | POA: Insufficient documentation

## 2023-09-05 NOTE — Patient Instructions (Signed)
 Try Pepcid initially.  If this is not effective you can try omeprazole.

## 2023-09-05 NOTE — Progress Notes (Signed)
 Robert Sandoval - 63 y.o. male MRN [id number]  Date of birth: 05/20/1960  Subjective Chief Complaint  Patient presents with   Hypertension    HPI Robert Sandoval is a 63 y.o. male here today for follow up visit.   Reports that he is doing well.  BP remains well controlled.  No side effects from current medication.   He is having increased heartburn symptoms.  Has not tried anything yet to help with this.  Denies nausea or abdominal pain.    ROS:  A comprehensive ROS was completed and negative except as noted per HPI  No Known Allergies  Past Medical History:  Diagnosis Date   Allergy    BPH (benign prostatic hyperplasia)    followed by [facility]   Hyperlipidemia    Hypertension    Low back pain radiating to left lower extremity    sees Dr. Duwayne   Stroke [facility])    05/2017    Past Surgical History:  Procedure Laterality Date   COLONOSCOPY     ESOPHAGOGASTRODUODENOSCOPY     REZUM      Social History   Socioeconomic History   Marital status: Married    Spouse name: Not on file   Number of children: 1   Years of education: Not on file   Highest education level: GED or equivalent  Occupational History   Occupation: driver  Tobacco Use   Smoking status: Never   Smokeless tobacco: Never  Vaping Use   Vaping status: Never Used  Substance and Sexual Activity   Alcohol use: Yes    Comment: Once a month    Drug use: No   Sexual activity: Yes    Partners: Female    Birth control/protection: Surgical    Comment: vasectomy  Other Topics Concern   Not on file  Social History Narrative   Lives at home with his wife   Right handed   Caffeine: 1 cup of coffee in the mornings    Social Drivers of Health   Financial Resource Strain: Low Risk  (09/05/2023)   Overall Financial Resource Strain (CARDIA)    Difficulty of Paying Living Expenses: Not hard at all  Food Insecurity: No Food Insecurity (09/05/2023)   Hunger Vital Sign    Worried About Running Out of Food in  the Last Year: Never true    Ran Out of Food in the Last Year: Never true  Transportation Needs: No Transportation Needs (09/05/2023)   PRAPARE - Administrator, Civil Service (Medical): No    Lack of Transportation (Non-Medical): No  Physical Activity: Sufficiently Active (09/05/2023)   Exercise Vital Sign    Days of Exercise per Week: 5 days    Minutes of Exercise per Session: 40 min  Stress: No Stress Concern Present (09/05/2023)   [employer] of Occupational Health - Occupational Stress Questionnaire    Feeling of Stress: Not at all  Social Connections: Moderately Integrated (09/05/2023)   Social Connection and Isolation Panel    Frequency of Communication with Friends and Family: Twice a week    Frequency of Social Gatherings with Friends and Family: Once a week    Attends Religious Services: More than 4 times per year    Active Member of [employer] or Organizations: No    Attends Engineer, structural: Not on file    Marital Status: Married    Family History  Problem Relation Age of Onset   Cirrhosis Mother  NASH   Other Mother        [place] as a child   Hyperlipidemia Father    Asthma Father    Heart failure Father    Colon cancer Neg Hx    Esophageal cancer Neg Hx    Rectal cancer Neg Hx    Stomach cancer Neg Hx     Health Maintenance  Topic Date Due   Pneumococcal Vaccine: 50+ Years (1 of 1 - PCV) Never done   COVID-19 Vaccine (3 - 2024-25 season) 09/17/2022   INFLUENZA VACCINE  08/17/2023   Colonoscopy  12/05/2025   DTaP/Tdap/Td (2 - Td or Tdap) 08/14/2028   Hepatitis C Screening  Completed   HIV Screening  Completed   Zoster Vaccines- Shingrix   Completed   Hepatitis B Vaccines 19-59 Average Risk  Aged Out   HPV VACCINES  Aged Out   Meningococcal B Vaccine  Aged Out      ----------------------------------------------------------------------------------------------------------------------------------------------------------------------------------------------------------------- Physical Exam BP 101/63 (BP Location: Left Arm, Patient Position: Sitting, Cuff Size: Normal)   Pulse 68   Ht 5' 8 (1.727 m)   Wt 160 lb (72.6 kg)   SpO2 97%   BMI 24.33 kg/m   Physical Exam Constitutional:      Appearance: Normal appearance.  Cardiovascular:     Rate and Rhythm: Normal rate and regular rhythm.  Pulmonary:     Effort: Pulmonary effort is normal.     Breath sounds: Normal breath sounds.  Neurological:     General: No focal deficit present.     Mental Status: He is alert.  Psychiatric:        Mood and Affect: Mood normal.        Behavior: Behavior normal.     ------------------------------------------------------------------------------------------------------------------------------------------------------------------------------------------------------------------- Assessment and Plan  Essential hypertension Doing well with lisinopril  at current strength.  Recommend continuation.   GERD (gastroesophageal reflux disease) Recommend trial of pepcid initially.  If this is not effective he can change to omeprazole.    No orders of the defined types were placed in this encounter.   Return in about 6 months (around 03/07/2024) for Hypertension.

## 2023-09-05 NOTE — Assessment & Plan Note (Signed)
 Recommend trial of pepcid initially.  If this is not effective he can change to omeprazole.

## 2023-09-05 NOTE — Assessment & Plan Note (Signed)
Doing well with lisinopril at current strength.  Recommend continuation.

## 2024-01-16 ENCOUNTER — Other Ambulatory Visit: Payer: Self-pay | Admitting: Family Medicine

## 2024-01-16 DIAGNOSIS — I6381 Other cerebral infarction due to occlusion or stenosis of small artery: Secondary | ICD-10-CM

## 2024-02-01 ENCOUNTER — Other Ambulatory Visit: Payer: Self-pay | Admitting: Family Medicine

## 2024-02-01 DIAGNOSIS — I10 Essential (primary) hypertension: Secondary | ICD-10-CM

## 2024-03-06 ENCOUNTER — Ambulatory Visit: Admitting: Family Medicine
